# Patient Record
Sex: Female | Born: 1985 | Race: Black or African American | Hispanic: No | Marital: Single | Smoking: Current every day smoker
Health system: Southern US, Community
[De-identification: ages and names within clinical notes are randomized; demographics above are authoritative.]

## PROBLEM LIST (undated history)

## (undated) DIAGNOSIS — F209 Schizophrenia, unspecified: Secondary | ICD-10-CM

## (undated) DIAGNOSIS — F319 Bipolar disorder, unspecified: Secondary | ICD-10-CM

## (undated) DIAGNOSIS — B582 Toxoplasma meningoencephalitis: Secondary | ICD-10-CM

## (undated) DIAGNOSIS — F1910 Other psychoactive substance abuse, uncomplicated: Secondary | ICD-10-CM

## (undated) DIAGNOSIS — G2581 Restless legs syndrome: Secondary | ICD-10-CM

## (undated) DIAGNOSIS — B20 Human immunodeficiency virus [HIV] disease: Secondary | ICD-10-CM

## (undated) DIAGNOSIS — I10 Essential (primary) hypertension: Secondary | ICD-10-CM

## (undated) DIAGNOSIS — Z21 Asymptomatic human immunodeficiency virus [HIV] infection status: Secondary | ICD-10-CM

## (undated) DIAGNOSIS — E669 Obesity, unspecified: Secondary | ICD-10-CM

## (undated) DIAGNOSIS — B1910 Unspecified viral hepatitis B without hepatic coma: Secondary | ICD-10-CM

## (undated) HISTORY — DX: Unspecified viral hepatitis B without hepatic coma: B19.10

## (undated) HISTORY — DX: Other psychoactive substance abuse, uncomplicated: F19.10

## (undated) HISTORY — PX: TYMPANOSTOMY TUBE PLACEMENT: SHX32

## (undated) HISTORY — DX: Bipolar disorder, unspecified: F31.9

## (undated) HISTORY — PX: HERNIA REPAIR: SHX51

## (undated) HISTORY — PX: TUBAL LIGATION: SHX77

## (undated) HISTORY — DX: Schizophrenia, unspecified: F20.9

## (undated) HISTORY — DX: Human immunodeficiency virus (HIV) disease: B20

## (undated) HISTORY — DX: Asymptomatic human immunodeficiency virus (hiv) infection status: Z21

---
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
## 2004-12-19 ENCOUNTER — Emergency Department (HOSPITAL_COMMUNITY): Admission: EM | Admit: 2004-12-19 | Discharge: 2004-12-19 | Payer: Self-pay | Admitting: Emergency Medicine

## 2005-02-04 ENCOUNTER — Encounter: Payer: Self-pay | Admitting: *Deleted

## 2005-02-04 ENCOUNTER — Inpatient Hospital Stay (HOSPITAL_COMMUNITY): Admission: AD | Admit: 2005-02-04 | Discharge: 2005-02-04 | Payer: Self-pay | Admitting: *Deleted

## 2005-03-18 ENCOUNTER — Emergency Department (HOSPITAL_COMMUNITY): Admission: EM | Admit: 2005-03-18 | Discharge: 2005-03-18 | Payer: Self-pay | Admitting: Emergency Medicine

## 2005-08-23 ENCOUNTER — Emergency Department (HOSPITAL_COMMUNITY): Admission: EM | Admit: 2005-08-23 | Discharge: 2005-08-23 | Payer: Self-pay | Admitting: Emergency Medicine

## 2005-09-15 ENCOUNTER — Emergency Department (HOSPITAL_COMMUNITY): Admission: EM | Admit: 2005-09-15 | Discharge: 2005-09-15 | Payer: Self-pay | Admitting: Family Medicine

## 2005-10-25 ENCOUNTER — Emergency Department (HOSPITAL_COMMUNITY): Admission: EM | Admit: 2005-10-25 | Discharge: 2005-10-25 | Payer: Self-pay | Admitting: Emergency Medicine

## 2005-11-09 ENCOUNTER — Emergency Department (HOSPITAL_COMMUNITY): Admission: EM | Admit: 2005-11-09 | Discharge: 2005-11-09 | Payer: Self-pay | Admitting: Family Medicine

## 2005-11-21 ENCOUNTER — Emergency Department (HOSPITAL_COMMUNITY): Admission: EM | Admit: 2005-11-21 | Discharge: 2005-11-21 | Payer: Self-pay | Admitting: Emergency Medicine

## 2006-03-15 ENCOUNTER — Emergency Department (HOSPITAL_COMMUNITY): Admission: EM | Admit: 2006-03-15 | Discharge: 2006-03-15 | Payer: Self-pay | Admitting: Emergency Medicine

## 2006-04-18 ENCOUNTER — Emergency Department (HOSPITAL_COMMUNITY): Admission: EM | Admit: 2006-04-18 | Discharge: 2006-04-18 | Payer: Self-pay | Admitting: Family Medicine

## 2006-05-05 ENCOUNTER — Emergency Department (HOSPITAL_COMMUNITY): Admission: EM | Admit: 2006-05-05 | Discharge: 2006-05-05 | Payer: Self-pay | Admitting: Family Medicine

## 2006-08-26 ENCOUNTER — Emergency Department (HOSPITAL_COMMUNITY): Admission: EM | Admit: 2006-08-26 | Discharge: 2006-08-26 | Payer: Self-pay | Admitting: Family Medicine

## 2006-10-13 ENCOUNTER — Ambulatory Visit: Payer: Self-pay | Admitting: Psychiatry

## 2006-10-13 ENCOUNTER — Inpatient Hospital Stay (HOSPITAL_COMMUNITY): Admission: AD | Admit: 2006-10-13 | Discharge: 2006-10-15 | Payer: Self-pay | Admitting: Psychiatry

## 2007-02-06 ENCOUNTER — Emergency Department (HOSPITAL_COMMUNITY): Admission: EM | Admit: 2007-02-06 | Discharge: 2007-02-06 | Payer: Self-pay | Admitting: Family Medicine

## 2007-03-17 ENCOUNTER — Emergency Department (HOSPITAL_COMMUNITY): Admission: EM | Admit: 2007-03-17 | Discharge: 2007-03-17 | Payer: Self-pay | Admitting: Emergency Medicine

## 2007-06-03 ENCOUNTER — Inpatient Hospital Stay (HOSPITAL_COMMUNITY): Admission: AD | Admit: 2007-06-03 | Discharge: 2007-06-03 | Payer: Self-pay | Admitting: Obstetrics & Gynecology

## 2007-07-16 ENCOUNTER — Emergency Department (HOSPITAL_COMMUNITY): Admission: EM | Admit: 2007-07-16 | Discharge: 2007-07-16 | Payer: Self-pay | Admitting: Emergency Medicine

## 2007-09-17 ENCOUNTER — Emergency Department (HOSPITAL_COMMUNITY): Admission: EM | Admit: 2007-09-17 | Discharge: 2007-09-17 | Payer: Self-pay | Admitting: Emergency Medicine

## 2007-09-27 ENCOUNTER — Inpatient Hospital Stay (HOSPITAL_COMMUNITY): Admission: AD | Admit: 2007-09-27 | Discharge: 2007-09-28 | Payer: Self-pay | Admitting: Obstetrics and Gynecology

## 2007-11-05 ENCOUNTER — Emergency Department (HOSPITAL_COMMUNITY): Admission: EM | Admit: 2007-11-05 | Discharge: 2007-11-05 | Payer: Self-pay | Admitting: Emergency Medicine

## 2007-11-26 ENCOUNTER — Emergency Department (HOSPITAL_COMMUNITY): Admission: EM | Admit: 2007-11-26 | Discharge: 2007-11-26 | Payer: Self-pay | Admitting: Emergency Medicine

## 2008-03-13 ENCOUNTER — Emergency Department (HOSPITAL_COMMUNITY): Admission: EM | Admit: 2008-03-13 | Discharge: 2008-03-13 | Payer: Self-pay | Admitting: Emergency Medicine

## 2008-03-15 ENCOUNTER — Inpatient Hospital Stay (HOSPITAL_COMMUNITY): Admission: AD | Admit: 2008-03-15 | Discharge: 2008-03-15 | Payer: Self-pay | Admitting: Obstetrics & Gynecology

## 2008-04-06 ENCOUNTER — Ambulatory Visit: Payer: Self-pay | Admitting: Obstetrics & Gynecology

## 2008-04-06 ENCOUNTER — Encounter: Payer: Self-pay | Admitting: Internal Medicine

## 2008-04-06 ENCOUNTER — Encounter: Payer: Self-pay | Admitting: Obstetrics and Gynecology

## 2008-04-06 LAB — CONVERTED CEMR LAB
Pap Smear: NORMAL
RPR Ser Ql: NEGATIVE

## 2008-04-08 ENCOUNTER — Ambulatory Visit: Payer: Self-pay | Admitting: Obstetrics and Gynecology

## 2008-04-14 ENCOUNTER — Encounter: Payer: Self-pay | Admitting: Internal Medicine

## 2008-04-21 ENCOUNTER — Ambulatory Visit: Payer: Self-pay | Admitting: Family Medicine

## 2008-04-27 ENCOUNTER — Ambulatory Visit: Payer: Self-pay | Admitting: Internal Medicine

## 2008-04-27 DIAGNOSIS — B20 Human immunodeficiency virus [HIV] disease: Secondary | ICD-10-CM

## 2008-04-28 ENCOUNTER — Ambulatory Visit (HOSPITAL_COMMUNITY): Admission: RE | Admit: 2008-04-28 | Discharge: 2008-04-28 | Payer: Self-pay | Admitting: Family Medicine

## 2008-05-02 ENCOUNTER — Encounter: Payer: Self-pay | Admitting: Internal Medicine

## 2008-05-04 ENCOUNTER — Telehealth: Payer: Self-pay | Admitting: Internal Medicine

## 2008-05-10 ENCOUNTER — Encounter (INDEPENDENT_AMBULATORY_CARE_PROVIDER_SITE_OTHER): Payer: Self-pay | Admitting: *Deleted

## 2008-05-11 ENCOUNTER — Telehealth: Payer: Self-pay | Admitting: Internal Medicine

## 2008-05-12 ENCOUNTER — Ambulatory Visit: Payer: Self-pay | Admitting: Obstetrics & Gynecology

## 2008-05-30 ENCOUNTER — Telehealth (INDEPENDENT_AMBULATORY_CARE_PROVIDER_SITE_OTHER): Payer: Self-pay | Admitting: *Deleted

## 2008-05-30 ENCOUNTER — Inpatient Hospital Stay (HOSPITAL_COMMUNITY): Admission: AD | Admit: 2008-05-30 | Discharge: 2008-05-30 | Payer: Self-pay | Admitting: Obstetrics & Gynecology

## 2008-06-02 ENCOUNTER — Ambulatory Visit (HOSPITAL_COMMUNITY): Admission: RE | Admit: 2008-06-02 | Discharge: 2008-06-02 | Payer: Self-pay | Admitting: Family Medicine

## 2008-06-10 ENCOUNTER — Telehealth (INDEPENDENT_AMBULATORY_CARE_PROVIDER_SITE_OTHER): Payer: Self-pay | Admitting: *Deleted

## 2008-06-28 ENCOUNTER — Encounter: Admission: RE | Admit: 2008-06-28 | Discharge: 2008-06-28 | Payer: Self-pay | Admitting: Internal Medicine

## 2008-06-28 ENCOUNTER — Ambulatory Visit: Payer: Self-pay | Admitting: Internal Medicine

## 2008-06-28 LAB — CONVERTED CEMR LAB
ALT: 12 units/L (ref 0–35)
AST: 14 units/L (ref 0–37)
Alkaline Phosphatase: 64 units/L (ref 39–117)
Basophils Absolute: 0 10*3/uL (ref 0.0–0.1)
Basophils Relative: 0 % (ref 0–1)
Creatinine, Ser: 0.57 mg/dL (ref 0.40–1.20)
Eosinophils Absolute: 0.1 10*3/uL (ref 0.0–0.7)
Eosinophils Relative: 1 % (ref 0–5)
HCT: 33.8 % — ABNORMAL LOW (ref 36.0–46.0)
Lymphocytes Relative: 40 % (ref 12–46)
MCHC: 34.9 g/dL (ref 30.0–36.0)
MCV: 84.9 fL (ref 78.0–100.0)
Platelets: 258 10*3/uL (ref 150–400)
RDW: 18.2 % — ABNORMAL HIGH (ref 11.5–15.5)
Sodium: 136 meq/L (ref 135–145)
Total Bilirubin: 0.5 mg/dL (ref 0.3–1.2)
Total Protein: 6.5 g/dL (ref 6.0–8.3)

## 2008-06-30 ENCOUNTER — Ambulatory Visit (HOSPITAL_COMMUNITY): Admission: RE | Admit: 2008-06-30 | Discharge: 2008-06-30 | Payer: Self-pay | Admitting: Family Medicine

## 2008-07-15 ENCOUNTER — Encounter: Payer: Self-pay | Admitting: Internal Medicine

## 2008-07-28 ENCOUNTER — Encounter: Payer: Self-pay | Admitting: Internal Medicine

## 2008-08-08 ENCOUNTER — Ambulatory Visit: Payer: Self-pay | Admitting: Obstetrics & Gynecology

## 2008-08-11 ENCOUNTER — Ambulatory Visit (HOSPITAL_COMMUNITY): Admission: RE | Admit: 2008-08-11 | Discharge: 2008-08-11 | Payer: Self-pay | Admitting: Family Medicine

## 2008-08-22 ENCOUNTER — Ambulatory Visit: Payer: Self-pay | Admitting: Family Medicine

## 2008-08-24 ENCOUNTER — Ambulatory Visit: Payer: Self-pay | Admitting: Internal Medicine

## 2008-08-24 LAB — CONVERTED CEMR LAB
ALT: 15 units/L (ref 0–35)
BUN: 8 mg/dL (ref 6–23)
Basophils Absolute: 0 10*3/uL (ref 0.0–0.1)
Basophils Relative: 0 % (ref 0–1)
CO2: 21 meq/L (ref 19–32)
Calcium: 8.3 mg/dL — ABNORMAL LOW (ref 8.4–10.5)
Chloride: 104 meq/L (ref 96–112)
Creatinine, Ser: 0.59 mg/dL (ref 0.40–1.20)
Eosinophils Relative: 1 % (ref 0–5)
HCT: 36.2 % (ref 36.0–46.0)
Hemoglobin: 12.5 g/dL (ref 12.0–15.0)
MCHC: 34.5 g/dL (ref 30.0–36.0)
Monocytes Absolute: 0.4 10*3/uL (ref 0.1–1.0)
Monocytes Relative: 6 % (ref 3–12)
RBC: 3.93 M/uL (ref 3.87–5.11)
RDW: 17.5 % — ABNORMAL HIGH (ref 11.5–15.5)
Total Bilirubin: 0.9 mg/dL (ref 0.3–1.2)

## 2008-08-26 LAB — CONVERTED CEMR LAB: HIV 1 RNA Quant: <50 copies/mL (ref <50)

## 2008-09-01 ENCOUNTER — Ambulatory Visit (HOSPITAL_COMMUNITY): Admission: RE | Admit: 2008-09-01 | Discharge: 2008-09-01 | Payer: Self-pay | Admitting: Family Medicine

## 2008-09-05 ENCOUNTER — Ambulatory Visit: Payer: Self-pay | Admitting: Obstetrics & Gynecology

## 2008-09-07 ENCOUNTER — Ambulatory Visit: Payer: Self-pay | Admitting: Internal Medicine

## 2008-09-12 ENCOUNTER — Ambulatory Visit: Payer: Self-pay | Admitting: Obstetrics & Gynecology

## 2008-09-19 ENCOUNTER — Ambulatory Visit: Payer: Self-pay | Admitting: Obstetrics & Gynecology

## 2008-09-20 ENCOUNTER — Encounter: Payer: Self-pay | Admitting: Obstetrics & Gynecology

## 2008-09-22 ENCOUNTER — Ambulatory Visit: Payer: Self-pay | Admitting: Family Medicine

## 2008-09-22 ENCOUNTER — Encounter: Payer: Self-pay | Admitting: Obstetrics & Gynecology

## 2008-09-22 LAB — CONVERTED CEMR LAB
Albumin: 3.4 g/dL — ABNORMAL LOW (ref 3.5–5.2)
BUN: 9 mg/dL (ref 6–23)
CO2: 19 meq/L (ref 19–32)
Calcium: 8.3 mg/dL — ABNORMAL LOW (ref 8.4–10.5)
Chloride: 106 meq/L (ref 96–112)
Collection Interval-CRCL: 24 hr
Creatinine, Urine: 85 mg/dL
Glucose, Bld: 70 mg/dL (ref 70–99)
Hemoglobin: 12.3 g/dL (ref 12.0–15.0)
MCHC: 34.9 g/dL (ref 30.0–36.0)
Potassium: 3.3 meq/L — ABNORMAL LOW (ref 3.5–5.3)
Protein, Ur: 165 mg/24hr — ABNORMAL HIGH (ref 50–100)
RBC: 3.81 M/uL — ABNORMAL LOW (ref 3.87–5.11)
Sodium: 137 meq/L (ref 135–145)
Total Protein: 6.6 g/dL (ref 6.0–8.3)
Uric Acid, Serum: 5.1 mg/dL (ref 2.4–7.0)
WBC: 4.4 10*3/uL (ref 4.0–10.5)

## 2008-09-29 ENCOUNTER — Ambulatory Visit (HOSPITAL_COMMUNITY): Admission: RE | Admit: 2008-09-29 | Discharge: 2008-09-29 | Payer: Self-pay | Admitting: Family Medicine

## 2008-09-29 ENCOUNTER — Ambulatory Visit: Payer: Self-pay | Admitting: Obstetrics & Gynecology

## 2008-10-03 ENCOUNTER — Ambulatory Visit: Payer: Self-pay | Admitting: Obstetrics & Gynecology

## 2008-10-06 ENCOUNTER — Ambulatory Visit: Payer: Self-pay | Admitting: Obstetrics & Gynecology

## 2008-10-06 LAB — CONVERTED CEMR LAB
HIV 1 RNA Quant: <48 copies/mL (ref <48)
HIV-1 RNA Quant, Log: <1.68 (ref <1.68)

## 2008-10-10 ENCOUNTER — Ambulatory Visit: Payer: Self-pay | Admitting: Family Medicine

## 2008-10-13 ENCOUNTER — Ambulatory Visit: Payer: Self-pay | Admitting: Obstetrics & Gynecology

## 2008-10-14 ENCOUNTER — Encounter: Payer: Self-pay | Admitting: Obstetrics & Gynecology

## 2008-10-15 ENCOUNTER — Inpatient Hospital Stay (HOSPITAL_COMMUNITY): Admission: AD | Admit: 2008-10-15 | Discharge: 2008-10-15 | Payer: Self-pay | Admitting: Family Medicine

## 2008-10-21 ENCOUNTER — Ambulatory Visit: Payer: Self-pay | Admitting: Obstetrics & Gynecology

## 2008-10-21 ENCOUNTER — Inpatient Hospital Stay (HOSPITAL_COMMUNITY): Admission: AD | Admit: 2008-10-21 | Discharge: 2008-10-25 | Payer: Self-pay | Admitting: Obstetrics & Gynecology

## 2008-10-23 ENCOUNTER — Encounter: Payer: Self-pay | Admitting: Obstetrics & Gynecology

## 2008-10-31 IMAGING — US US OB COMP LESS 14 WK
1 series · 14 of 28 positions shown · non-contrast
Comparison: none

CLINICAL DATA: Positive pregnancy test, abdominal and pelvic pain

OBSTETRIC <14 WK ULTRASOUND
TECHNIQUE: Transabdominal ultrasound was performed for evaluation
of the gestation as well as the maternal uterus and adnexal
regions.
Intrauterine gestational sac: Present
Yolk sac: Present
Embryo: Present
Cardiac Activity: Present
Heart Rate: 140 bpm
CRL:  6 mm         6w  2d           US EDC: 11/06/2008
Maternal uterus/adnexae:
Right ovary:  3.6 x 3.0 x 3.4 cm cyst noted, possibly a corpus
luteum.
The left ovary is normal.

[Series 1: us ob comp less 14 wk · 0.28mm/px · 14 of 30 slices shown]
[im 2/30]
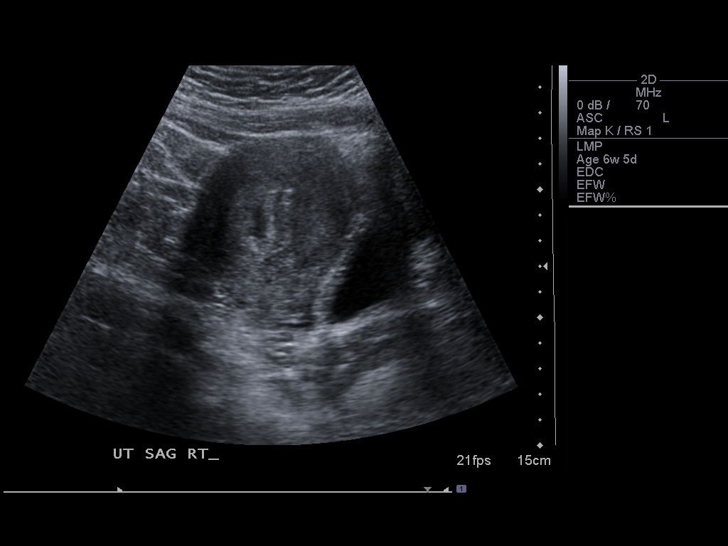
[im 4/30]
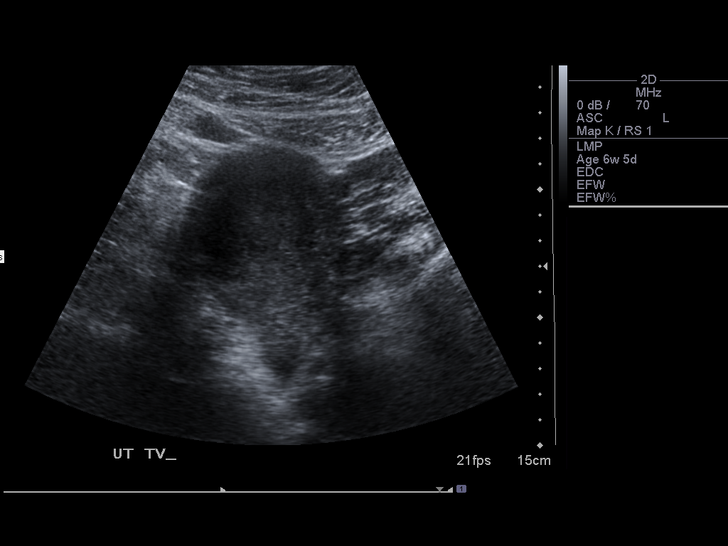
[im 6/30]
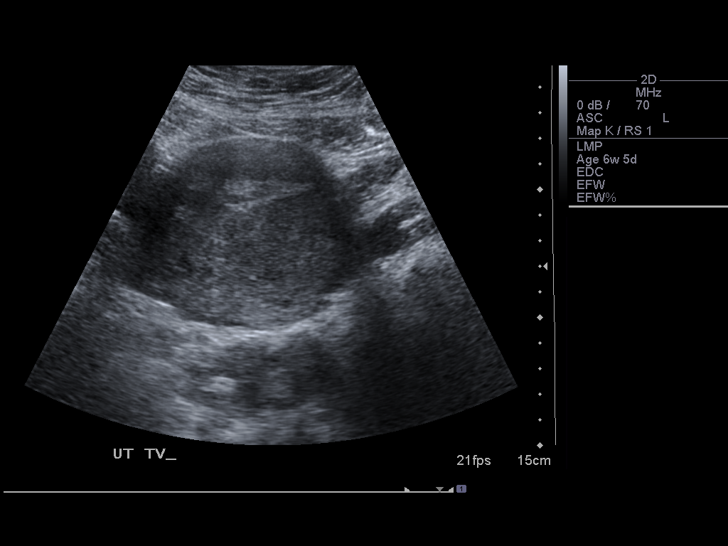
[im 8/30]
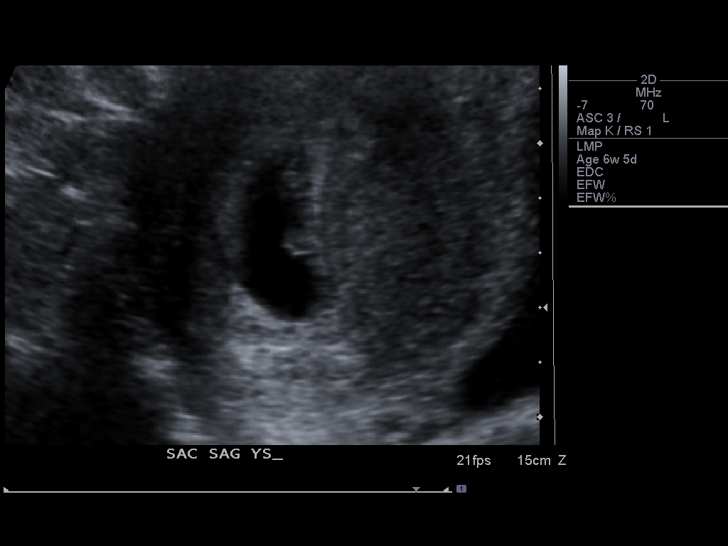
[im 10/30]
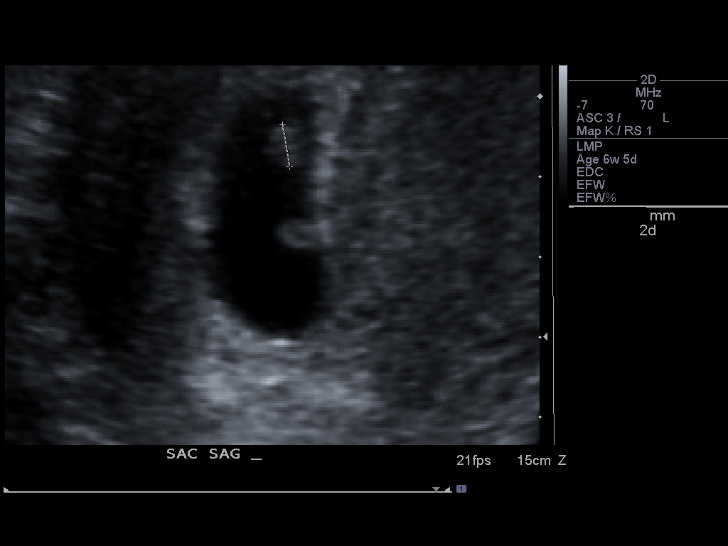
[im 12/30]
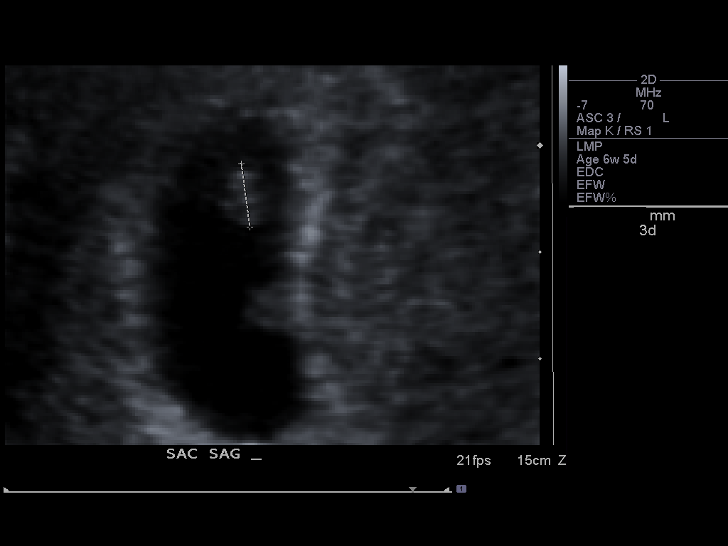
[im 14/30]
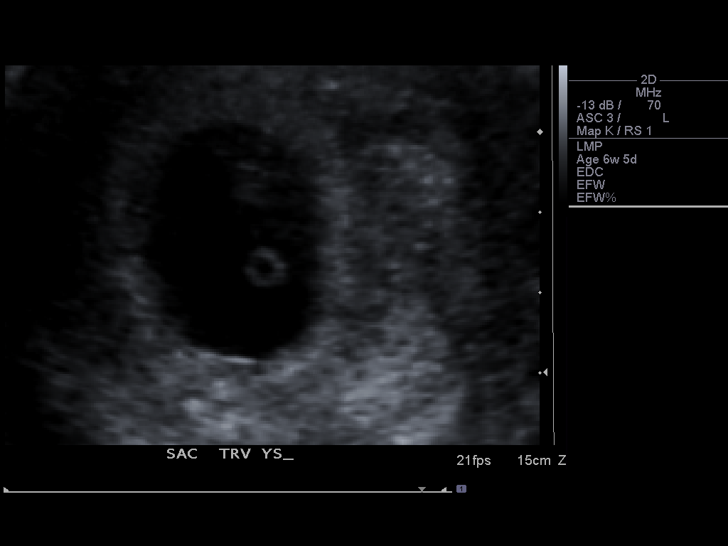
[im 17/30]
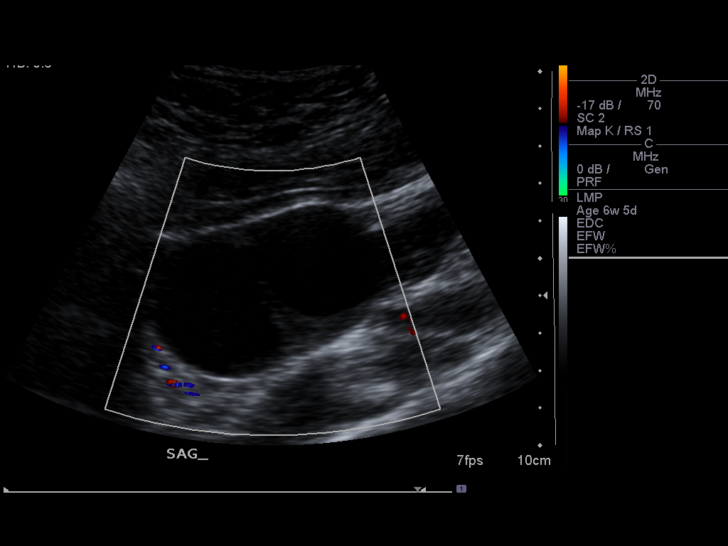
[im 19/30]
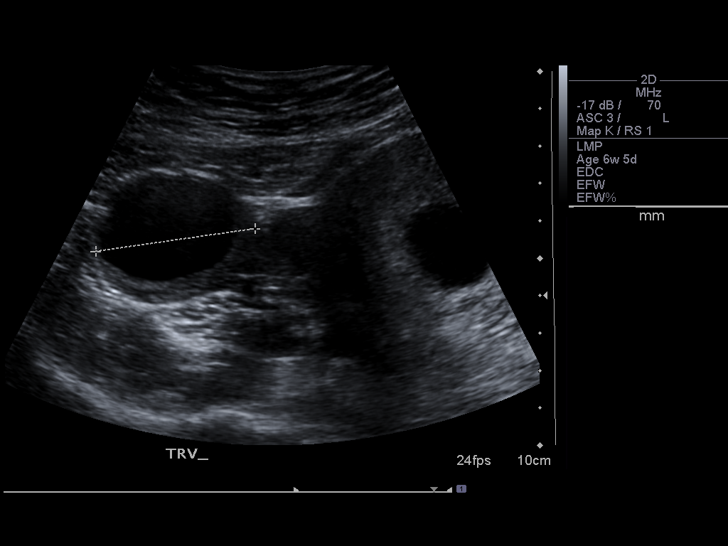
[im 21/30]
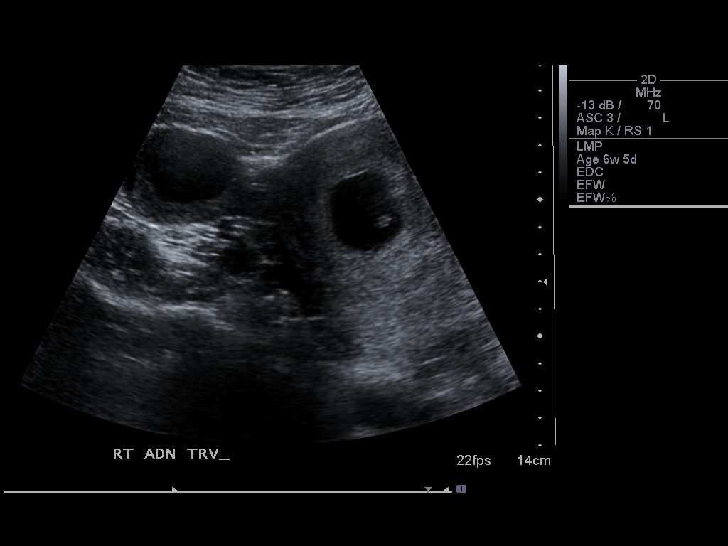
[im 23/30]
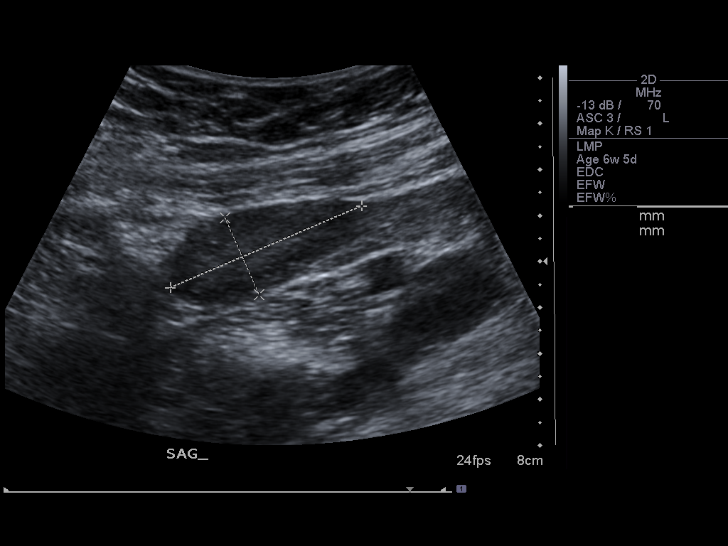
[im 25/30]
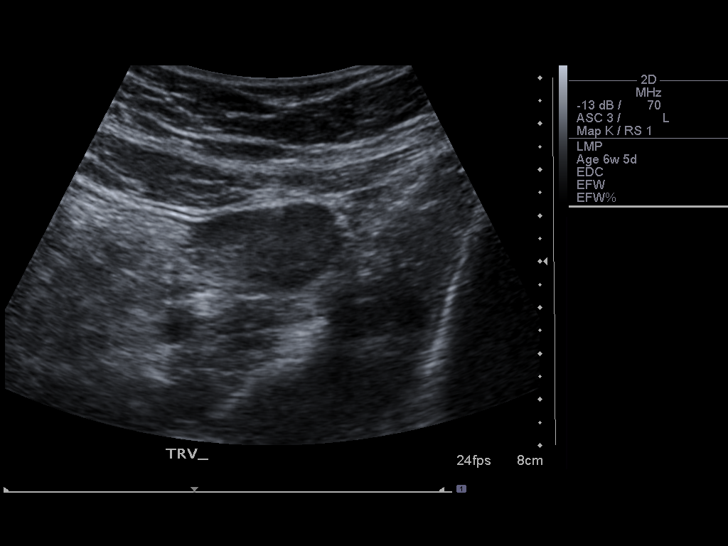
[im 27/30]
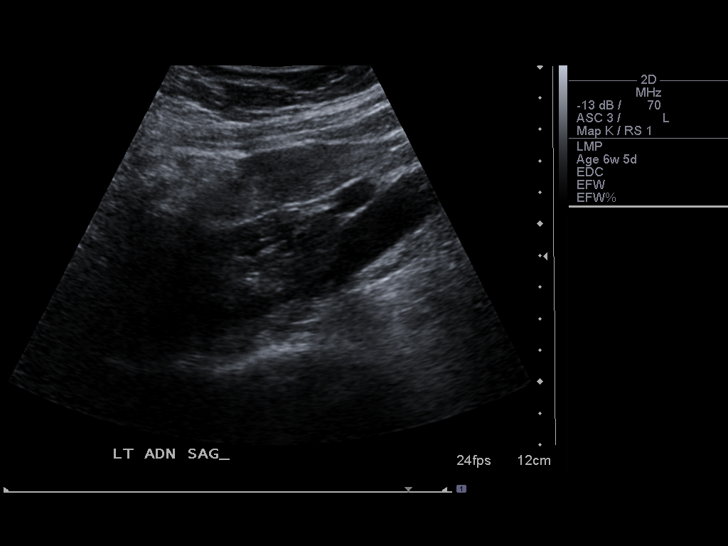
[im 30/30]
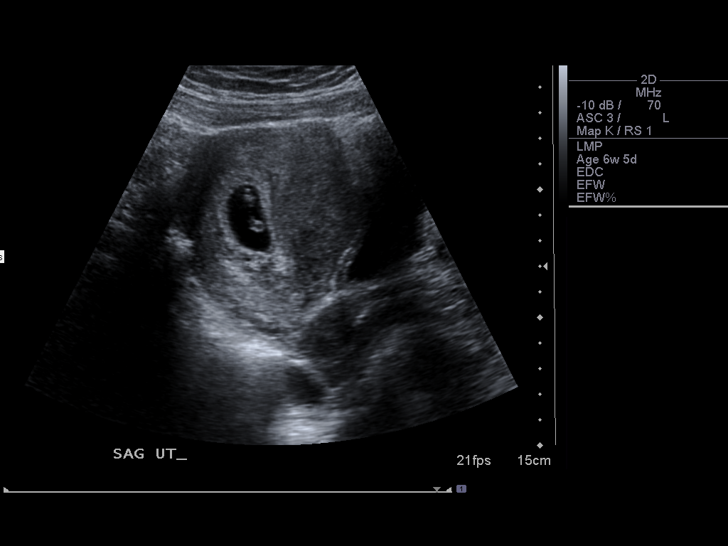

[14 of 28 positions shown; findings below may reference images not displayed]

IMPRESSION: Intrauterine gestational sac, yolk sac, fetal pole, and cardiac
activity noted.  Gestational age of 6w2d by crown-rump length is
concordant with assigned gestational age of 6 weeks 5 days by LMP,
EDC by LMP 11/03/2008.  No acute finding.

Right ovarian cyst, possibly a corpus luteum.

## 2008-12-05 ENCOUNTER — Ambulatory Visit: Payer: Self-pay | Admitting: Internal Medicine

## 2008-12-05 LAB — CONVERTED CEMR LAB
ALT: 13 units/L (ref 0–35)
Basophils Absolute: 0 10*3/uL (ref 0.0–0.1)
CO2: 22 meq/L (ref 19–32)
Calcium: 9.1 mg/dL (ref 8.4–10.5)
Chloride: 105 meq/L (ref 96–112)
Creatinine, Ser: 0.72 mg/dL (ref 0.40–1.20)
Glucose, Bld: 77 mg/dL (ref 70–99)
HCT: 40.4 % (ref 36.0–46.0)
HIV 1 RNA Quant: <48 copies/mL (ref <48)
HIV-1 RNA Quant, Log: <1.68 (ref <1.68)
Hemoglobin: 14 g/dL (ref 12.0–15.0)
Lymphocytes Relative: 62 % — ABNORMAL HIGH (ref 12–46)
Lymphs Abs: 3 10*3/uL (ref 0.7–4.0)
Monocytes Absolute: 0.3 10*3/uL (ref 0.1–1.0)
Neutro Abs: 1.5 10*3/uL — ABNORMAL LOW (ref 1.7–7.7)
RBC: 4.26 M/uL (ref 3.87–5.11)
RDW: 13.6 % (ref 11.5–15.5)
Total Protein: 7.4 g/dL (ref 6.0–8.3)
WBC: 4.9 10*3/uL (ref 4.0–10.5)

## 2008-12-21 ENCOUNTER — Ambulatory Visit: Payer: Self-pay | Admitting: Obstetrics and Gynecology

## 2008-12-21 ENCOUNTER — Ambulatory Visit: Payer: Self-pay | Admitting: Internal Medicine

## 2008-12-22 ENCOUNTER — Encounter: Payer: Self-pay | Admitting: Obstetrics & Gynecology

## 2008-12-22 LAB — CONVERTED CEMR LAB: Trich, Wet Prep: NONE SEEN

## 2009-01-05 ENCOUNTER — Encounter (INDEPENDENT_AMBULATORY_CARE_PROVIDER_SITE_OTHER): Payer: Self-pay | Admitting: *Deleted

## 2009-01-09 ENCOUNTER — Encounter: Payer: Self-pay | Admitting: Internal Medicine

## 2009-03-20 ENCOUNTER — Emergency Department (HOSPITAL_COMMUNITY): Admission: EM | Admit: 2009-03-20 | Discharge: 2009-03-20 | Payer: Self-pay | Admitting: Family Medicine

## 2009-03-21 ENCOUNTER — Ambulatory Visit: Payer: Self-pay | Admitting: Internal Medicine

## 2009-03-21 LAB — CONVERTED CEMR LAB
Albumin: 3.9 g/dL (ref 3.5–5.2)
BUN: 17 mg/dL (ref 6–23)
Calcium: 8.8 mg/dL (ref 8.4–10.5)
Chloride: 106 meq/L (ref 96–112)
Creatinine, Ser: 0.73 mg/dL (ref 0.40–1.20)
Eosinophils Absolute: 0.1 10*3/uL (ref 0.0–0.7)
GFR calc Af Amer: >60 mL/min (ref >60)
GFR calc non Af Amer: >60 mL/min (ref >60)
Glucose, Bld: 85 mg/dL (ref 70–99)
HIV 1 RNA Quant: 3530 copies/mL — ABNORMAL HIGH (ref <48)
HIV-1 RNA Quant, Log: 3.55 — ABNORMAL HIGH (ref <1.68)
Lymphocytes Relative: 56 % — ABNORMAL HIGH (ref 12–46)
Lymphs Abs: 2.4 10*3/uL (ref 0.7–4.0)
Neutro Abs: 1.4 10*3/uL — ABNORMAL LOW (ref 1.7–7.7)
Neutrophils Relative %: 34 % — ABNORMAL LOW (ref 43–77)
Platelets: 267 10*3/uL (ref 150–400)
Potassium: 4.5 meq/L (ref 3.5–5.3)
WBC: 4.2 10*3/uL (ref 4.0–10.5)

## 2009-04-03 ENCOUNTER — Inpatient Hospital Stay (HOSPITAL_COMMUNITY): Admission: AD | Admit: 2009-04-03 | Discharge: 2009-04-03 | Payer: Self-pay | Admitting: Family Medicine

## 2009-04-19 IMAGING — US US OB FOLLOW-UP
1 series · 14 of 28 positions shown · non-contrast
Comparison: none

OBSTETRICAL ULTRASOUND:
 This ultrasound was performed in The [HOSPITAL], and the [facility] report will be stored to [REDACTED] PACS.

[Series 1: us ob follow-up · 14 of 33 slices shown]
[im 2/33]
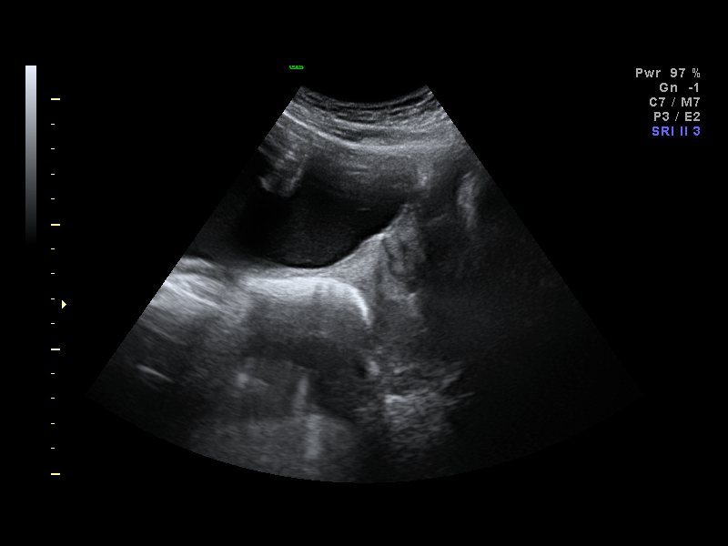
[im 4/33]
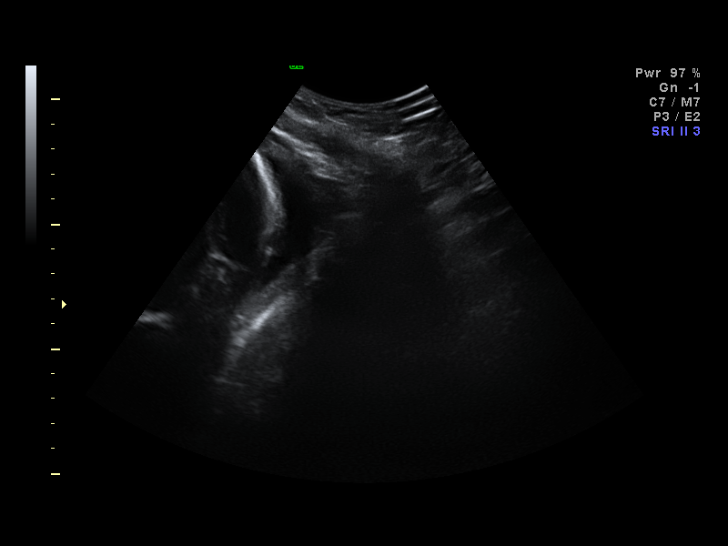
[im 6/33]
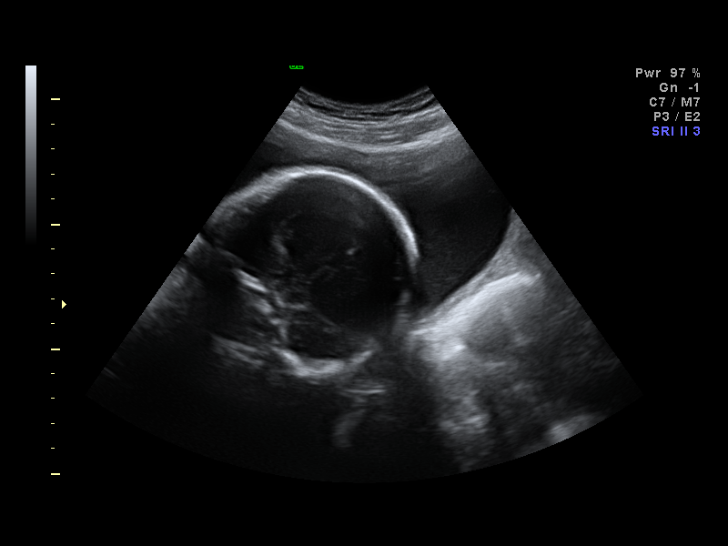
[im 9/33]
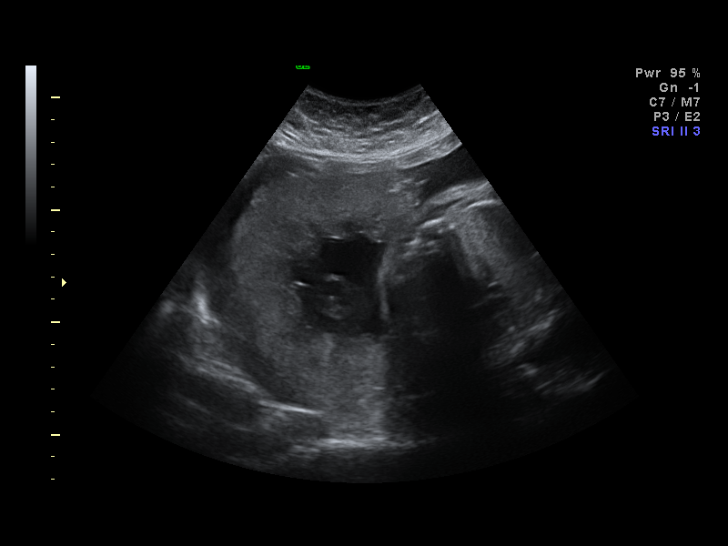
[im 11/33]
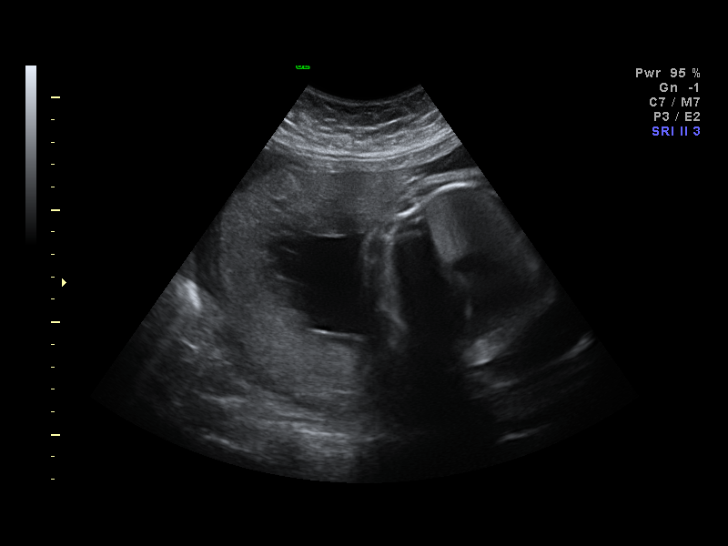
[im 14/33]
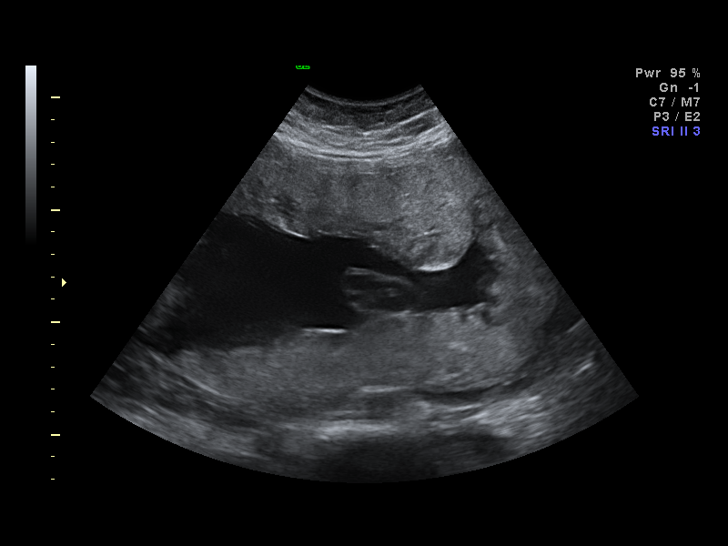
[im 16/33]
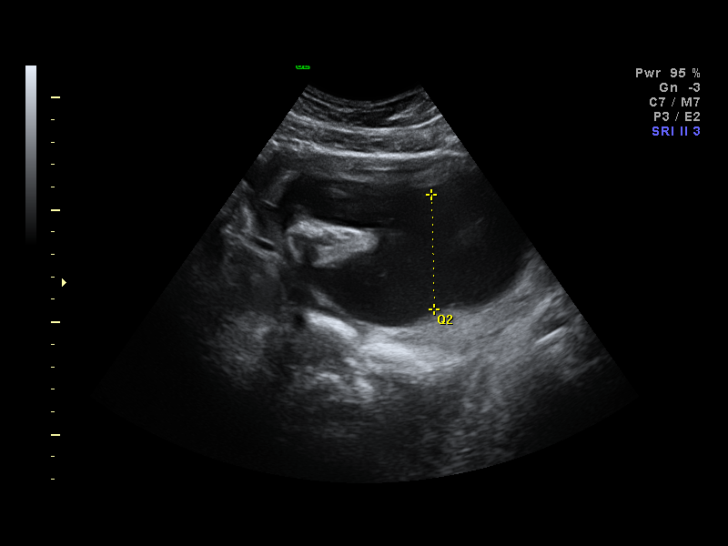
[im 18/33]
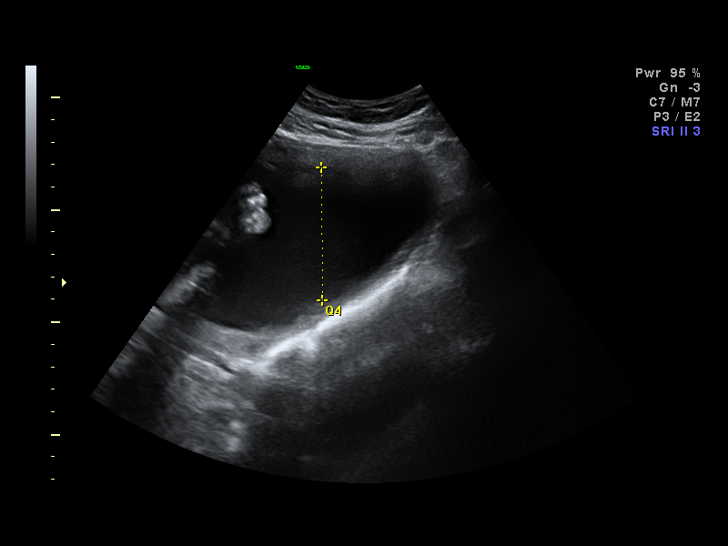
[im 21/33]
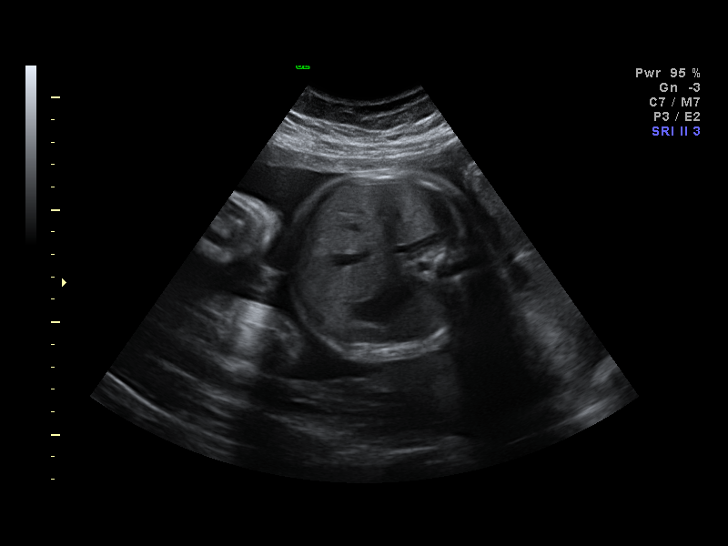
[im 23/33]
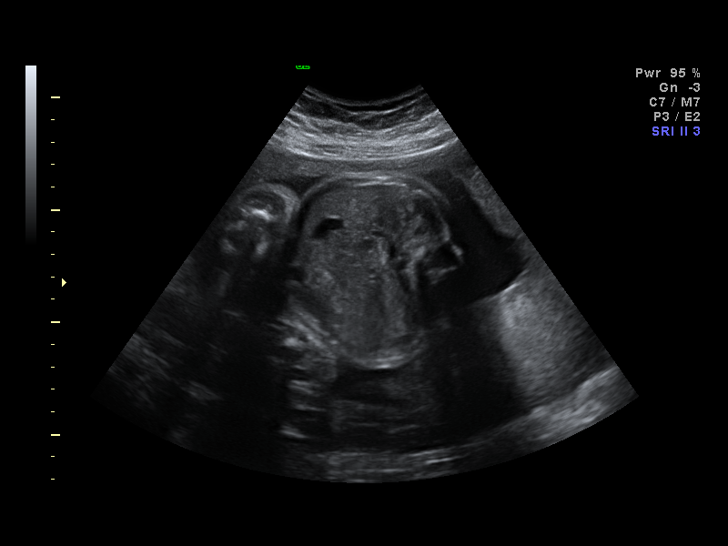
[im 25/33]
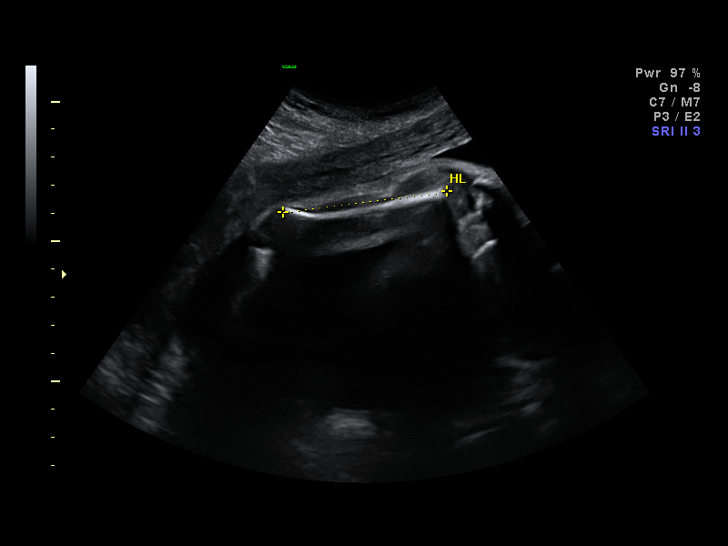
[im 28/33]
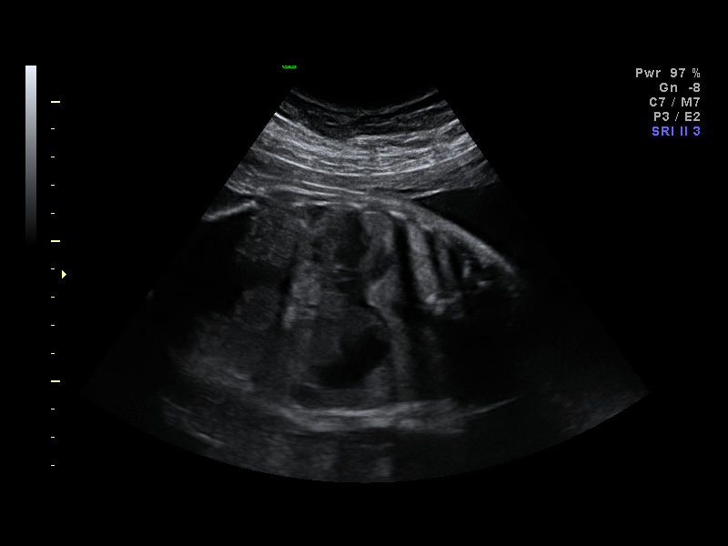
[im 30/33]
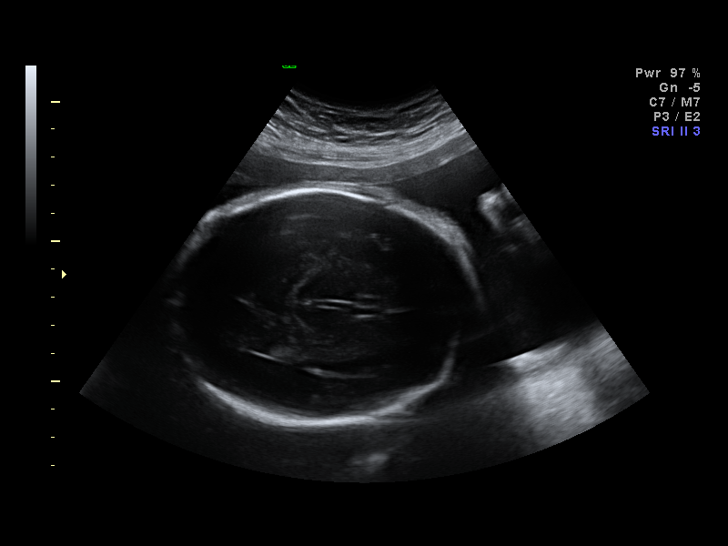
[im 33/33]
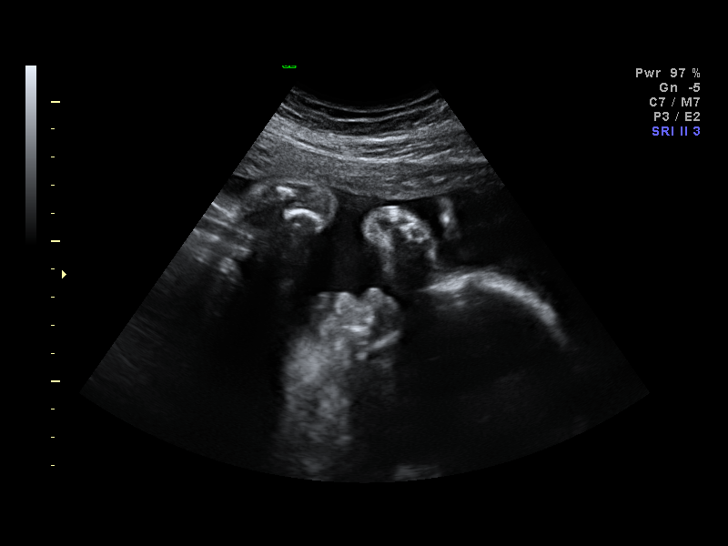

[14 of 28 positions shown; findings below may reference images not displayed]

IMPRESSION: [facility] has also been faxed to the ordering physician.

## 2009-06-23 ENCOUNTER — Encounter: Payer: Self-pay | Admitting: Internal Medicine

## 2009-06-28 ENCOUNTER — Telehealth (INDEPENDENT_AMBULATORY_CARE_PROVIDER_SITE_OTHER): Payer: Self-pay | Admitting: *Deleted

## 2009-06-29 ENCOUNTER — Ambulatory Visit: Payer: Self-pay | Admitting: Internal Medicine

## 2009-06-29 LAB — CONVERTED CEMR LAB: HIV 1 RNA Quant: 4530 copies/mL — ABNORMAL HIGH (ref <48)

## 2009-07-14 ENCOUNTER — Ambulatory Visit: Payer: Self-pay | Admitting: Internal Medicine

## 2009-08-04 ENCOUNTER — Emergency Department (HOSPITAL_COMMUNITY): Admission: EM | Admit: 2009-08-04 | Discharge: 2009-08-04 | Payer: Self-pay | Admitting: Emergency Medicine

## 2009-08-08 ENCOUNTER — Emergency Department (HOSPITAL_COMMUNITY): Admission: EM | Admit: 2009-08-08 | Discharge: 2009-08-08 | Payer: Self-pay | Admitting: Family Medicine

## 2009-08-28 ENCOUNTER — Encounter: Admission: RE | Admit: 2009-08-28 | Discharge: 2009-08-28 | Payer: Self-pay | Admitting: General Surgery

## 2009-08-29 ENCOUNTER — Ambulatory Visit (HOSPITAL_BASED_OUTPATIENT_CLINIC_OR_DEPARTMENT_OTHER): Admission: RE | Admit: 2009-08-29 | Discharge: 2009-08-29 | Payer: Self-pay | Admitting: General Surgery

## 2009-09-05 ENCOUNTER — Emergency Department (HOSPITAL_COMMUNITY): Admission: EM | Admit: 2009-09-05 | Discharge: 2009-09-06 | Payer: Self-pay | Admitting: Emergency Medicine

## 2009-09-11 ENCOUNTER — Emergency Department (HOSPITAL_COMMUNITY): Admission: EM | Admit: 2009-09-11 | Discharge: 2009-09-11 | Payer: Self-pay | Admitting: Emergency Medicine

## 2009-10-01 ENCOUNTER — Emergency Department (HOSPITAL_COMMUNITY): Admission: EM | Admit: 2009-10-01 | Discharge: 2009-10-01 | Payer: Self-pay | Admitting: Emergency Medicine

## 2009-10-02 ENCOUNTER — Encounter: Payer: Self-pay | Admitting: *Deleted

## 2009-11-07 ENCOUNTER — Encounter (INDEPENDENT_AMBULATORY_CARE_PROVIDER_SITE_OTHER): Payer: Self-pay | Admitting: *Deleted

## 2009-11-29 ENCOUNTER — Telehealth (INDEPENDENT_AMBULATORY_CARE_PROVIDER_SITE_OTHER): Payer: Self-pay | Admitting: *Deleted

## 2009-12-08 ENCOUNTER — Encounter (INDEPENDENT_AMBULATORY_CARE_PROVIDER_SITE_OTHER): Payer: Self-pay | Admitting: *Deleted

## 2010-01-05 ENCOUNTER — Encounter: Payer: Self-pay | Admitting: Internal Medicine

## 2010-02-18 ENCOUNTER — Emergency Department (HOSPITAL_BASED_OUTPATIENT_CLINIC_OR_DEPARTMENT_OTHER): Admission: EM | Admit: 2010-02-18 | Discharge: 2010-02-18 | Payer: Self-pay | Admitting: Emergency Medicine

## 2010-04-29 IMAGING — CR DG ABDOMEN 2V
2 series · 2 of 2 positions shown · non-contrast
Comparison: None

CLINICAL DATA: Abdominal pain/abdominal hernia surgery 1 month ago

ABDOMEN - 2 VIEW

[w abdomen upright *]
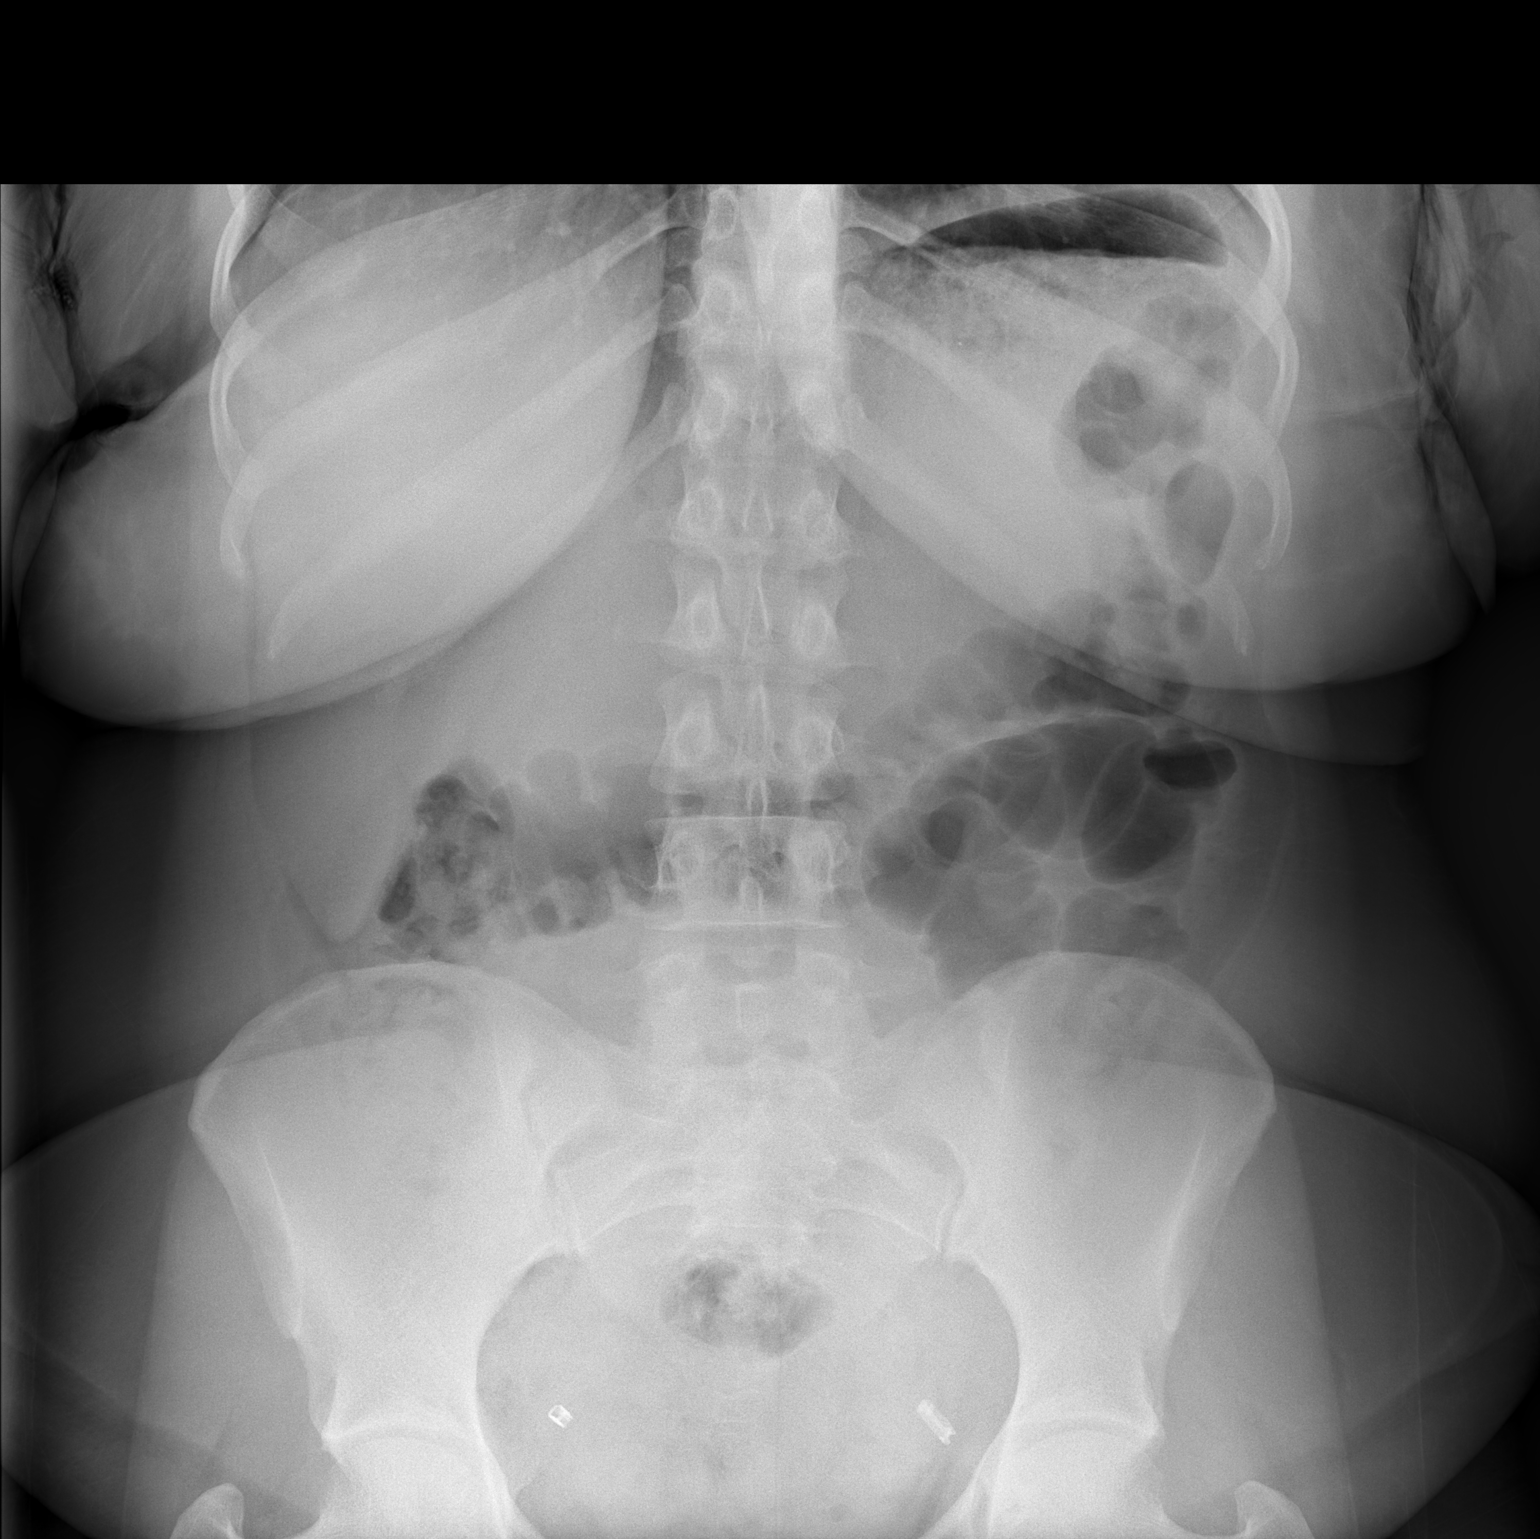

[t abdomen supine]
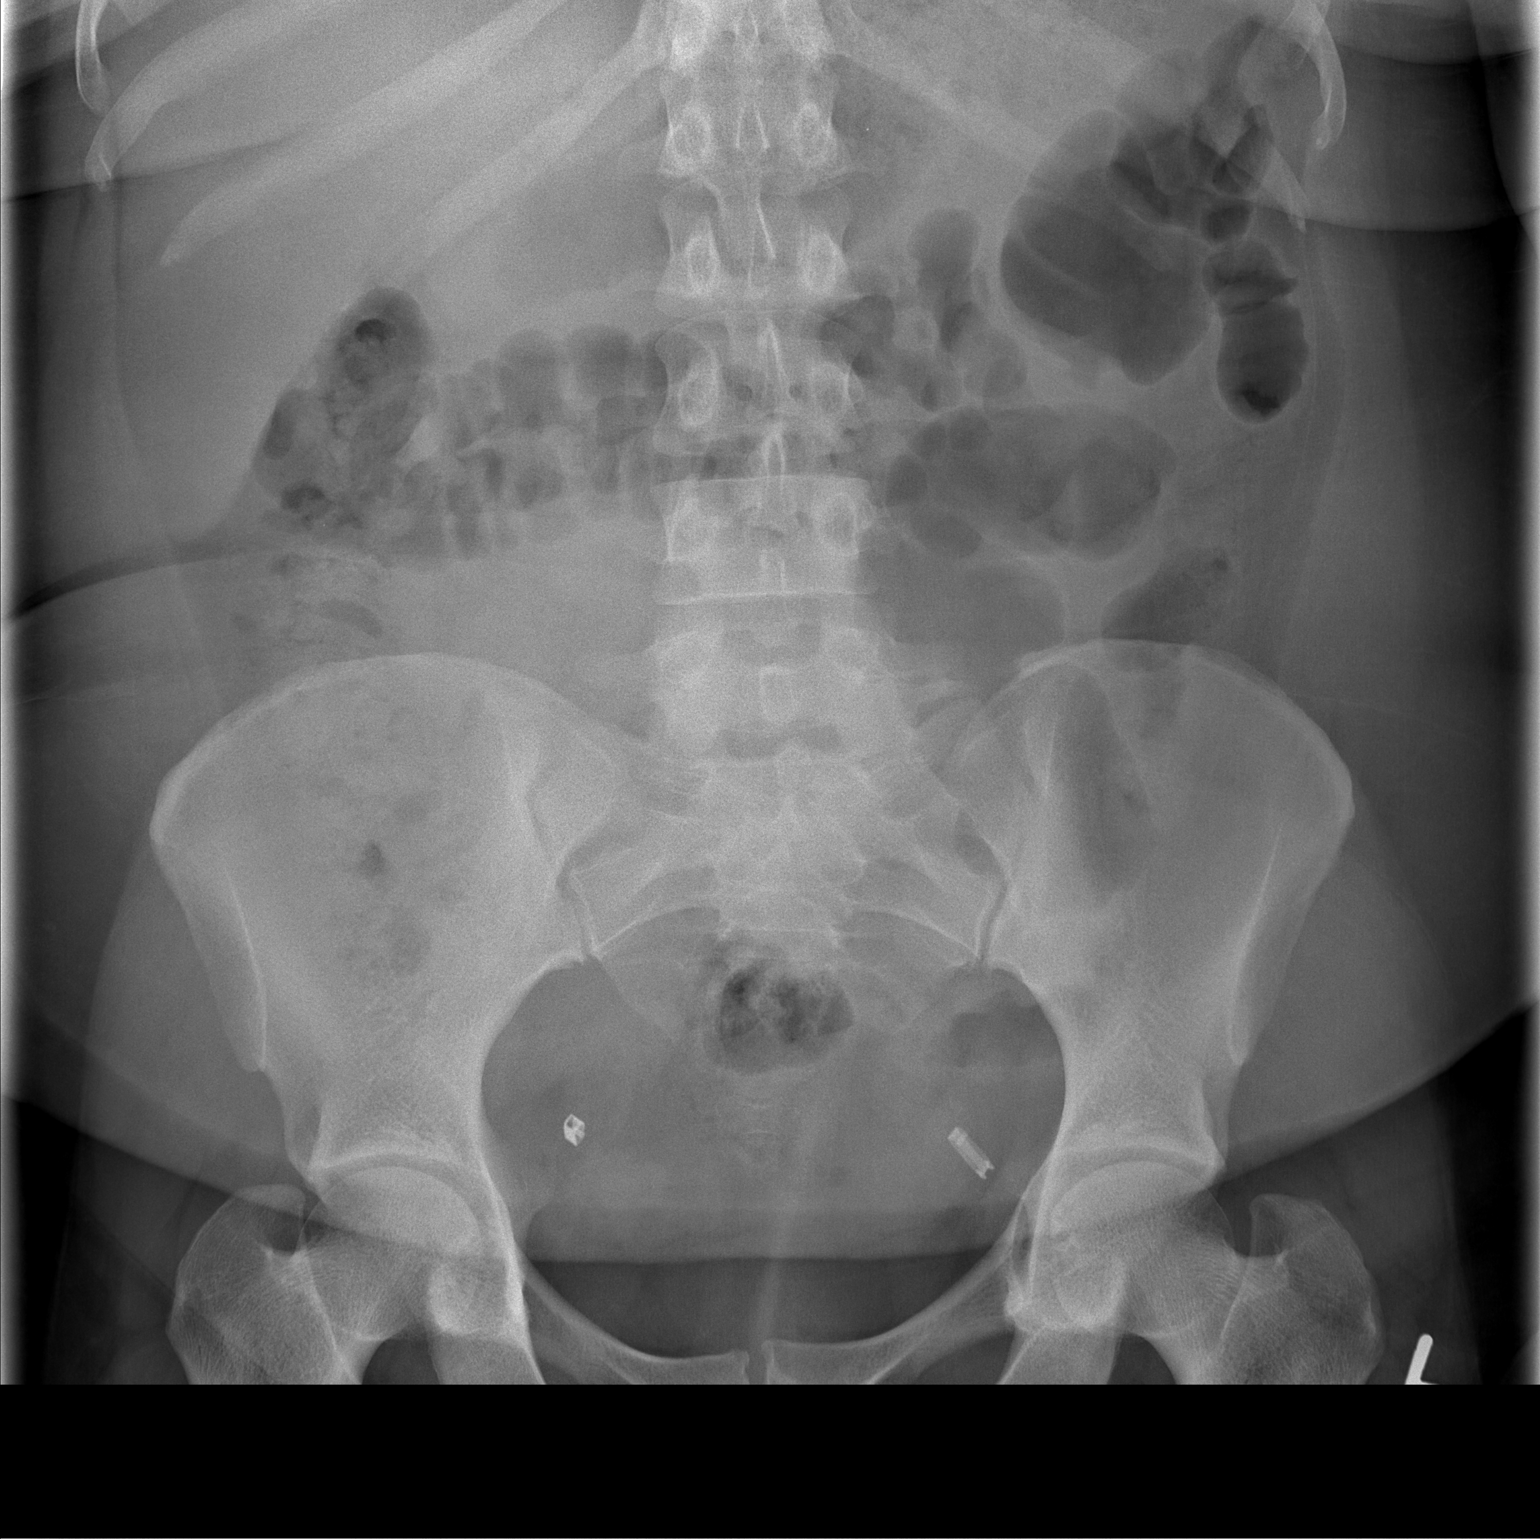

[2 of 2 positions shown; findings below may reference images not displayed]

FINDINGS: No free air or acute/specific abnormality of the bowel
gas pattern.  Prior bilateral tubal ligation.  No other acute or
significant findings.
IMPRESSION: No acute or specific findings - prior tubal ligation.

## 2010-12-16 ENCOUNTER — Encounter: Payer: Self-pay | Admitting: *Deleted

## 2010-12-16 ENCOUNTER — Encounter: Payer: Self-pay | Admitting: Obstetrics & Gynecology

## 2010-12-17 ENCOUNTER — Encounter: Payer: Self-pay | Admitting: *Deleted

## 2010-12-25 NOTE — Letter (Signed)
Summary: Generic Letter  [facility]  [address]   [place], [place] [address]   Phone: [phone]  Fax: [phone]          January 05, 2010  [facility] [address] [place], [place]  [address]  Dear Ms. Miralles,  It is time to call the clinic to schedule an appointment for your Annual PAP Smear.  Please call the number above to schedule this important yearly test.  I look forward to seeing you soon.    Thank you.   Sincerely,    Jennet Maduro RN

## 2010-12-25 NOTE — Miscellaneous (Signed)
Summary: RW Update  Clinical Lists Changes  Observations: Added new observation of HIV RISK BEH: Heterosexual contact (12/08/2009 15:06)

## 2010-12-25 NOTE — Progress Notes (Signed)
Summary: pt. to call back to make lab, MD and PAP appt.  Phone Note Outgoing Call   Call placed by: Jennet Maduro RN,  November 29, 2009 11:47 AM Call placed to: Patient Summary of Call: RN spoke with the pt.  RN requested that the pt. call back at [phone] to make and keep appt. for labs, MD and PAP smear.  Pt. verbalized understanding. Jennet Maduro RN  November 29, 2009 11:48 AM

## 2011-01-09 ENCOUNTER — Encounter: Payer: Self-pay | Admitting: Adult Health

## 2011-01-22 NOTE — Miscellaneous (Signed)
Summary: [place] [facility]  [facility]   Imported By: Florinda Marker 01/15/2011 14:58:49  _____________________________________________________________________  External Attachment:    Type:   Image     Comment:   External Document

## 2011-02-18 LAB — BASIC METABOLIC PANEL
CO2: 26 mEq/L (ref 19–32)
Calcium: 9.5 mg/dL (ref 8.4–10.5)
Glucose, Bld: 109 mg/dL — ABNORMAL HIGH (ref 70–99)
Sodium: 145 mEq/L (ref 135–145)

## 2011-02-18 LAB — URINE CULTURE: Colony Count: NO GROWTH

## 2011-02-18 LAB — URINALYSIS, ROUTINE W REFLEX MICROSCOPIC
Bilirubin Urine: NEGATIVE
Ketones, ur: 15 mg/dL — AB
Nitrite: NEGATIVE
Protein, ur: 100 mg/dL — AB
Specific Gravity, Urine: 1.03 (ref 1.005–1.030)
Urobilinogen, UA: 0.2 mg/dL (ref 0.0–1.0)

## 2011-02-18 LAB — DIFFERENTIAL
Basophils Absolute: 0.2 10*3/uL — ABNORMAL HIGH (ref 0.0–0.1)
Basophils Relative: 2 % — ABNORMAL HIGH (ref 0–1)
Eosinophils Relative: 0 % (ref 0–5)
Monocytes Absolute: 0.2 10*3/uL (ref 0.1–1.0)
Neutro Abs: 9.8 10*3/uL — ABNORMAL HIGH (ref 1.7–7.7)

## 2011-02-18 LAB — CBC
Hemoglobin: 14.5 g/dL (ref 12.0–15.0)
MCHC: 33.1 g/dL (ref 30.0–36.0)
RDW: 13.3 % (ref 11.5–15.5)

## 2011-02-18 LAB — GC/CHLAMYDIA PROBE AMP, GENITAL: GC Probe Amp, Genital: NEGATIVE

## 2011-02-27 LAB — URINE MICROSCOPIC-ADD ON: WBC, UA: NONE SEEN WBC/hpf (ref <3)

## 2011-02-27 LAB — URINALYSIS, ROUTINE W REFLEX MICROSCOPIC
Bilirubin Urine: NEGATIVE
Glucose, UA: NEGATIVE mg/dL
Ketones, ur: NEGATIVE mg/dL
Leukocytes, UA: NEGATIVE
Nitrite: NEGATIVE
Protein, ur: NEGATIVE mg/dL
Specific Gravity, Urine: 1.034 — ABNORMAL HIGH (ref 1.005–1.030)
Urobilinogen, UA: 0.2 mg/dL (ref 0.0–1.0)
pH: 5.5 (ref 5.0–8.0)

## 2011-02-27 LAB — GC/CHLAMYDIA PROBE AMP, GENITAL
Chlamydia, DNA Probe: NEGATIVE
GC Probe Amp, Genital: NEGATIVE

## 2011-02-27 LAB — POCT I-STAT, CHEM 8
Chloride: 105 mEq/L (ref 96–112)
HCT: 43 % (ref 36.0–46.0)
Potassium: 4.3 mEq/L (ref 3.5–5.1)

## 2011-02-27 LAB — WET PREP, GENITAL
Trich, Wet Prep: NONE SEEN
Yeast Wet Prep HPF POC: NONE SEEN

## 2011-02-27 LAB — PREGNANCY, URINE: Preg Test, Ur: NEGATIVE

## 2011-03-05 LAB — URINALYSIS, ROUTINE W REFLEX MICROSCOPIC
Glucose, UA: NEGATIVE mg/dL
Hgb urine dipstick: NEGATIVE
Specific Gravity, Urine: 1.02 (ref 1.005–1.030)

## 2011-03-06 LAB — POCT PREGNANCY, URINE: Preg Test, Ur: NEGATIVE

## 2011-03-06 LAB — WET PREP, GENITAL
Clue Cells Wet Prep HPF POC: NONE SEEN
Trich, Wet Prep: NONE SEEN

## 2011-03-06 LAB — GC/CHLAMYDIA PROBE AMP, GENITAL
Chlamydia, DNA Probe: NEGATIVE
GC Probe Amp, Genital: NEGATIVE

## 2011-03-06 LAB — POCT URINALYSIS DIP (DEVICE)
Glucose, UA: NEGATIVE mg/dL
Ketones, ur: NEGATIVE mg/dL
Specific Gravity, Urine: 1.025 (ref 1.005–1.030)
Urobilinogen, UA: 1 mg/dL (ref 0.0–1.0)

## 2011-03-11 LAB — T-HELPER CELL (CD4) - (RCID CLINIC ONLY): CD4 T Cell Abs: 1820 uL (ref 400–2700)

## 2011-04-09 NOTE — Group Therapy Note (Signed)
Penny Young, Penny Young NO.:  [id number]   MEDICAL RECORD NO.:  [id number]          PATIENT TYPE:  WOC   LOCATION:  [facility]                   FACILITY:  [facility]   PHYSICIAN:  Argentina Donovan, MD        DATE OF BIRTH:  June 15, 1986   DATE OF SERVICE:                                  CLINIC NOTE   ADDENDUM:  Please amend.  This patient is not breast-feeding.  She is  bottle-feeding.  She is HIV positive, and the patient is not breast-  feeding.   Please strike that from the interval history on the original report.     ______________________________  Maylon Cos, CNM    ______________________________  Argentina Donovan, MD    SS/MEDQ  D:  12/28/2008  T:  12/28/2008  Job:  [id number]

## 2011-04-09 NOTE — Op Note (Signed)
NAMEJOYCELYNN, FRITSCHE NO.:  [id number]   MEDICAL RECORD NO.:  [id number]         PATIENT TYPE:  WINP   LOCATION:                                [facility]   PHYSICIAN:  Lesly Dukes, M.D. DATE OF BIRTH:  Apr 01, 1986   DATE OF PROCEDURE:  10/23/2008  DATE OF DISCHARGE:                               OPERATIVE REPORT   DIAGNOSIS:  A 25 year old para 2 female with HIV desiring bilateral  tubal ligation.   POSTOPERATIVE DIAGNOSES:  77. A 25 year old para 2 female with HIV desiring bilateral tubal      ligation.  2. Peritoneal cyst.   PROCEDURES:  1. Postpartum bilateral tubal ligation with Filshie clips.  2. Biopsy of peritoneal cyst.   SURGEON:  Lesly Dukes, MD   ANESTHESIA:  Spinal.   SPECIMENS:  Peritoneal cyst and peritoneal washings to Pathology.   FINDINGS OF THE OPERATION:  Upon entering the abdominal cavity there was  some free fluid which was sent to Pathology as well as a little bit of  washings.  There were some peritoneal cysts cephalad to the umbilicus  and then some other peritoneal cysts that were draped down near the  bladder and more parts of the abdominal wall.  They do not seem to be  arising from any particular organ.  The ovaries were plump but appeared  normal. The limited view of the uterus was normal and the fallopian  tubes appeared normal.   PROCEDURE:  After informed consent was obtained the patient was taken to  the operating room where spinal anesthesia was found to be adequate.  The patient was placed in dorsal lithotomy position and prepared and  draped in normal sterile fashion.  The umbilicus was incised and the  peritoneal cavity was entered easily.  Retractor was placed into the  abdomen and that is where the peritoneal cysts were found.  The incision  was extended 2 cm in order to get better visualization of where the  cysts were coming from and they do not seem to be arising from any  particular organ.  The  findings are dictated above.  Each fallopian tube  was identified and followed out to its fimbriated end and a Filshie clip  was placed transversely across each fallopian tube in the proximal third  of each fallopian tube.  There was good hemostasis.  The fascia was  closed with 0 Vicryl in a running fashion and the subcutaneous tissue  was closed with 3-0 Vicryl in subcuticular fashion.  The patient  tolerated the procedure well.  Sponge, lap, instrument, and needle count  correct x2 and the patient went to recovery room in stable condition.     Lesly Dukes, M.D.  Electronically Signed    KHL/MEDQ  D:  10/23/2008  T:  10/23/2008  Job:  [id number]

## 2011-04-09 NOTE — Group Therapy Note (Signed)
Penny Young, GUISE                ACCOUNT NO.:  [id number]   MEDICAL RECORD NO.:  [id number]          PATIENT TYPE:  WOC   LOCATION:  [facility]                   [facility]   PHYSICIAN:  Maylon Cos, CNM    DATE OF BIRTH:  Jun 16, 1986   DATE OF SERVICE:  12/21/2008                                  CLINIC NOTE   The patient presents for a postpartum visit.  She is status post a  vaginal delivery and BTL on October 23, 2008.  The patient's past  medical history is significant for being HIV positive, hepatitis C  positive.  She has a history of Trichomonas, chlamydia, syphilis and  pregnancy.  She has chronic hypertension and substance abuse including  cocaine.  She has a history of asthma, bipolar, schizophrenia.   Her current medication regimen includes:  1. Combivir.  2. Kaletra.  3. Prenatal vitamins.  4. Labetalol 100 mg once today.  5. She also uses Colace p.r.n. as needed for constipation.   The patient had a vaginal delivery on November 29 that resulted in a  female infant weighing 7 pounds 1 ounce.  That birth was attended by Philipp Deputy, CNM.   INTERVAL HISTORY:  Includes the patient was evaluated by Dr. Philipp Deputy in  ID earlier today and states that Dr. Philipp Deputy was very happy with her  numbers.  Her CD-4 count according to the echart system was 60, and her  T helper cells were greater than 1300.  Dr. Philipp Deputy encouraged the  patient to stop her antivirals; however, the patient declined stopping  antivirals at this time and desires to stay on them.  According to the  patient, Dr. Philipp Deputy is okay with this plan.  Also, according to the  patient, she stopped vaginal bleeding approximately one week ago, seven  weeks status post her vaginal delivery.  She has not resumed  intercourse.  She has had no signs and symptoms of engorgement.  She  feels like emotionally that she is very stable.  She is not having any  signs and symptoms of postpartum depression and feels like  she is  getting adequate sleep.  She is sleeping approximately eight hours  nightly now that the baby is sleeping throughout the night.  She is  nursing without difficulty.  She is down 13 pounds since delivery.   PHYSICAL EXAMINATION:  Her vital signs are stable.  Temperature is 97.6,  pulse is 67, respirations 20.  Her blood pressure is 133/82.  Her weight  today is 244.  In general, she is a very pleasant African American  female who appears to be older than her stated age of 66.  She is in no  apparent distress.  Her exam is going to be problem focused today  secondary to complaints of foul-smelling vaginal discharge.  On  examination of the genitalia, perineum is intact.  There is a scant  amount of white creamy discharge noted at the introitus.  She is Tanner  5.  Bimanual exam was deferred.   ASSESSMENT/PLAN:  The patient is 7 weeks status post from a spontaneous  vaginal  delivery and BTL doing well.  She has vaginal irritation.  Wet  prep was sent for analysis.  The patient will be called if the need of  antibiotics is necessary.  We will continue the patient on her labetalol  100 mg daily at current secondary to her history.  Her blood pressure is  deemed well controlled on 133/82.  The patient is to return in 4 weeks  for blood pressure check at  which time we will make determination of need for continuation of her  labetalol and referral for primary care Est[facility] Llerena outside of GYN and ID.           ______________________________  Maylon Cos, CNM     SS/MEDQ  D:  12/21/2008  T:  12/21/2008  Job:  [id number]

## 2011-04-09 NOTE — Discharge Summary (Signed)
Penny Young, Penny Young                ACCOUNT NO.:  [id number]   MEDICAL RECORD NO.:  [id number]          PATIENT TYPE:  INP   LOCATION:  9104                          [facility]   PHYSICIAN:  Lesly Dukes, M.D. DATE OF BIRTH:  11-03-1986   DATE OF ADMISSION:  10/21/2008  DATE OF DISCHARGE:  10/25/2008                               DISCHARGE SUMMARY   ADMISSION DIAGNOSIS:  Onset of labor.   DISCHARGE DIAGNOSES:  Spontaneous vaginal delivery, human  immunodeficiency virus, and chronic hypertension.   PROCEDURES PERFORMED:  Nonstress test and postpartum tubal ligation.   COMPLICATIONS:  None.   HISTORY AND HOSPITAL COURSE:  Briefly, this is a G6, P 2-0-4-2 female  who underwent a normal spontaneous vaginal delivery at 38.3 weeks,  delivered a female, 7 pounds 1 ounce with Apgars of 9 at one and 9 at  five with no lacerations.  Cord blood sent.  Spontaneous placenta.  Estimated blood loss 250 mL.  Bilateral tubal ligation postpartum.   LABORATORY DATA:  HIV positive, RPR nonreactive, hepatitis B surface  antigen negative, blood B positive, rubella nonimmune, chlamydia  positive, GC negative, GBS positive.   Activity at discharge will be unrestricted.  Diet will be routine.   DISCHARGE MEDICATIONS:  1. Prenatal vitamins 1 tablet p.o. daily.  2. Percocet 1 tablet p.o. q.6 h. p.r.n. pain.  3. Colace 100 mg p.o. b.i.d. p.r.n. constipation.  4. Kaletra 2 tablets p.o. b.i.d.  5. Combivir 1 tablet p.o. b.i.d.  6. Labetalol 100 mg p.o. b.i.d.  7. Ibuprofen 600 mg p.o. q.6 h. p.r.n. pain.   DISCHARGE CONDITION:  Stable.   Disposition will be to home.  Followup will be in 6 weeks at the  [facility] Department.      Rodney Langton, MD      Lesly Dukes, M.D.  Electronically Signed    TT/MEDQ  D:  12/02/2008  T:  12/03/2008  Job:  [id number]

## 2011-04-12 NOTE — H&P (Signed)
Penny Young, KINDEL NO.:  [id number]   MEDICAL RECORD NO.:  [id number]          PATIENT TYPE:  MAT   LOCATION:  [facility]                          [facility]   PHYSICIAN:  Lesly Dukes, M.D. DATE OF BIRTH:  November 30, 1985   DATE OF ADMISSION:  02/04/2005  DATE OF DISCHARGE:                                HISTORY & PHYSICAL   HISTORY OF PRESENT ILLNESS:  The patient is a 25 year old female who was  getting prenatal care at [facility].  Her LMP was sometime in December,  and she had initial prenatal laboratories drawn.  She was noted to be HIV  positive with a confirmatory Western Blot also being positive.  She was  supposed to come to [facility] today for the results of this test;  however, she was having some vaginal bleeding, so she went to [facility].  The [facility] worked her up for a miscarriage.  They saw an intrauterine  pregnancy with a fetal pole measuring approximately 8 weeks, 2 days EGA,  with an irregular sac.  Her beta hCG at that time was 1900, and there was no  current vaginal bleeding.  Details of this visit is in the E chart.  The  patient was not anemic and Rh positive.  She was sent here for further  consultation about what to do for the miscarriage.  The patient was  sequestered, and her sister was placed in the waiting room, and was told  that she was HIV positive.  She had previously been having sex with a known  IV drug user.  She had had a negative test several months ago.  She did not  appear surprised.  She was slightly tearful, but was handling the news  remarkably well.  Infectious disease was called, and they need her records  before she can have an appointment.  These were requested from [facility] to be mailed to the Infectious Disease Clinic.  The patient would not  give a phone number, so the patient will call the infectious disease office  for an appointment in several days.  In the meantime, the sister was  brought  back in the room, and we decided to do a D&C.  I think this was the safest,  based on patient reliability, and also that the patient could be highly  infectious at this time, and I would not recommend her having heavy vaginal  bleeding around her family members.  They seemed that they would be very  helpful in aiding in her pain and bleeding, and possibly contract the  disease.  The patient is agreeable to the plan for D&C.  She gets her work  schedule on Wednesday, and will call my office on Wednesday.  She was given  the number, [phone].  Once I know her work schedule, we will then schedule  the D&C based around her job.  The patient was given ethic for miscarriage.  All questions were answered regarding both HIV and miscarriage and D&C.      KHL/MEDQ  D:  02/04/2005  T:  02/04/2005  Job:  [id number]

## 2011-04-12 NOTE — Discharge Summary (Signed)
[name], Penny Young NO.:  [id number]   MEDICAL RECORD NO.:  [id number]          PATIENT TYPE:  IPS   LOCATION:  0307                          [facility]ILITY[facility]   PHYSICIAN:  Anselm Jungling, MD  DATE OF BIRTH:  10-Nov-1986   DATE OF ADMISSION:  10/13/2006  DATE OF DISCHARGE:  10/15/2006                               DISCHARGE SUMMARY   IDENTIFYING DATA AND REASON FOR ADMISSION:  The patient is a 25 year old  single black female who was admitted for cocaine abuse, depression, and  suicidal ideation.  She was admitted on an involuntary basis.  She had  no prior inpatient treatment history.  She had been abusing cocaine  since age 59.  Her 51-year-old child was in the custody of others.  She  had been living with her father in [place] and was unemployed.  At  the time of admission she stated that she did not feel she needed  inpatient treatment, but needed some form of outside support group.  She described her father as alcoholic.  She denied other drugs except  for occasional marijuana.  Please refer to the admission note for  further details pertaining to the symptoms, circumstances and history  that led to HER hospitalization.  She was given initial Axis I diagnoses  of cocaine dependence and rule out mood disorder NOS.   MEDICAL AND LABORATORY:  The patient was medically and physically  assessed by the psychiatric nurse practitioner.  She was in good health  without any active or chronic medical problems.   HOSPITAL COURSE:  The patient was admitted to the adult inpatient  psychiatric service.  To address depressive symptoms she was placed on a  trial of Wellbutrin XL 150 mg daily which was well tolerated.  To assist  in falling asleep she was given trazodone 50 mg at bedtime as needed  with good results.  Symmetrel 100 mg twice daily was given for cocaine  cravings.   She participated in various therapeutic groups and activities including  groups oriented  towards 12-step recovery.  She worked with the Arts development officer regarding aftercare planning.  She indicated that she was open to  trying alcoholics anonymous or narcotics anonymous.  She was also open  to going to [facility] of the [place] for counseling, and was  requesting a Veterinary surgeon with specific credentials in substance abuse.   By the third hospital day the patient appeared to be appropriate for  discharge.  She was tolerating medications had been absent of suicidal  ideation throughout.  She was open and agreeable to following the below  referenced aftercare plan.   AFTERCARE:  The patient was to go to [facility] of the [place] for  an intake appointment on 10/23/2006, and to the [facility] for an  intake appointment on 10/28/2006.   DISCHARGE MEDICATIONS:  Symmetrel 100 mg twice daily, Wellbutrin XL 150  mg daily, and trazodone 50 mg at bedtime as needed for insomnia.   DISCHARGE DIAGNOSES:  AXIS I: Cocaine dependence, early remission,  depressive disorder NOS.  AXIS II: Deferred.  AXIS III: No acute or chronic illnesses.  AXIS IV: Stressors severe.  AXIS V: GAF on discharge 60.      Anselm Jungling, MD  Electronically Signed     SPB/MEDQ  D:  11/12/2006  T:  11/13/2006  Job:  [id number] [id number]

## 2011-08-20 LAB — DIFFERENTIAL
Basophils Absolute: 0
Eosinophils Absolute: 0.3
Lymphs Abs: 3.2
Monocytes Absolute: 0.4
Neutrophils Relative %: 55

## 2011-08-20 LAB — URINALYSIS, ROUTINE W REFLEX MICROSCOPIC
Bilirubin Urine: NEGATIVE
Bilirubin Urine: NEGATIVE
Ketones, ur: NEGATIVE
Ketones, ur: NEGATIVE
Nitrite: NEGATIVE
Nitrite: NEGATIVE
Protein, ur: NEGATIVE
Specific Gravity, Urine: 1.025
Urobilinogen, UA: 0.2
pH: 6

## 2011-08-20 LAB — WET PREP, GENITAL: Yeast Wet Prep HPF POC: NONE SEEN

## 2011-08-20 LAB — CBC
HCT: 37.9
Hemoglobin: 13.4
Hemoglobin: 13.2
MCHC: 34.3
MCHC: 34.7
Platelets: 308
RBC: 4.77
RDW: 14.5
WBC: 8.7

## 2011-08-20 LAB — GC/CHLAMYDIA PROBE AMP, GENITAL: Chlamydia, DNA Probe: POSITIVE — AB

## 2011-08-20 LAB — URINE MICROSCOPIC-ADD ON

## 2011-08-20 LAB — COMPREHENSIVE METABOLIC PANEL
ALT: 13
Alkaline Phosphatase: 75
CO2: 24
Calcium: 8.9
Chloride: 105
GFR calc non Af Amer: >60
Glucose, Bld: 80
Sodium: 135
Total Bilirubin: 0.7

## 2011-08-20 LAB — POCT I-STAT, CHEM 8
Calcium, Ion: 1.22
Creatinine, Ser: 0.8
Glucose, Bld: 78
HCT: 40
Hemoglobin: 13.6

## 2011-08-21 LAB — POCT URINALYSIS DIP (DEVICE)
Ketones, ur: NEGATIVE
Protein, ur: 30 — AB
Specific Gravity, Urine: 1.02
Urobilinogen, UA: 0.2
pH: 7

## 2011-08-22 LAB — URINE MICROSCOPIC-ADD ON

## 2011-08-22 LAB — POCT URINALYSIS DIP (DEVICE)
Bilirubin Urine: NEGATIVE
Glucose, UA: NEGATIVE
Hgb urine dipstick: NEGATIVE
Specific Gravity, Urine: 1.02

## 2011-08-22 LAB — URINALYSIS, ROUTINE W REFLEX MICROSCOPIC
Glucose, UA: NEGATIVE
Hgb urine dipstick: NEGATIVE
Specific Gravity, Urine: 1.015
Urobilinogen, UA: 0.2

## 2011-08-22 LAB — URINE CULTURE: Colony Count: >=100000

## 2011-08-22 LAB — WET PREP, GENITAL

## 2011-08-26 LAB — POCT URINALYSIS DIP (DEVICE)
Glucose, UA: NEGATIVE
Glucose, UA: NEGATIVE
Glucose, UA: NEGATIVE
Glucose, UA: NEGATIVE
Nitrite: NEGATIVE
Nitrite: NEGATIVE
Nitrite: NEGATIVE
Nitrite: NEGATIVE
Operator id: 194561
Operator id: 287931
Operator id: 297281
Operator id: 297281
Protein, ur: 100 — AB
Protein, ur: NEGATIVE
Specific Gravity, Urine: 1.025
Specific Gravity, Urine: 1.025
Urobilinogen, UA: 0.2
Urobilinogen, UA: 0.2
Urobilinogen, UA: 0.2
Urobilinogen, UA: 1

## 2011-08-26 LAB — T-HELPER CELL (CD4) - (RCID CLINIC ONLY): CD4 % Helper T Cell: 60 — ABNORMAL HIGH

## 2011-08-27 LAB — URINALYSIS, ROUTINE W REFLEX MICROSCOPIC
Bilirubin Urine: NEGATIVE
Ketones, ur: NEGATIVE
Nitrite: NEGATIVE
Protein, ur: NEGATIVE
Urobilinogen, UA: 0.2
pH: 5.5

## 2011-08-27 LAB — COMPREHENSIVE METABOLIC PANEL
ALT: 21 U/L (ref 0–35)
AST: 28 U/L (ref 0–37)
Albumin: 3 g/dL — ABNORMAL LOW (ref 3.5–5.2)
CO2: 21 mEq/L (ref 19–32)
Calcium: 8.8 mg/dL (ref 8.4–10.5)
Chloride: 107 mEq/L (ref 96–112)
Creatinine, Ser: 0.57 mg/dL (ref 0.4–1.2)
GFR calc Af Amer: >60 mL/min (ref >60)
GFR calc non Af Amer: >60 mL/min (ref >60)
Sodium: 135 mEq/L (ref 135–145)

## 2011-08-27 LAB — POCT URINALYSIS DIP (DEVICE)
Nitrite: NEGATIVE
Nitrite: NEGATIVE
Nitrite: NEGATIVE
Operator id: 287931
Operator id: 297281
Protein, ur: 30 — AB
Protein, ur: NEGATIVE
Protein, ur: NEGATIVE
Urobilinogen, UA: 0.2
Urobilinogen, UA: 0.2
pH: 6.5
pH: 6.5
pH: 7

## 2011-08-27 LAB — CBC
MCHC: 34.6 g/dL (ref 30.0–36.0)
Platelets: 223 10*3/uL (ref 150–400)
RBC: 3.5 MIL/uL — ABNORMAL LOW (ref 3.87–5.11)
WBC: 8.5 10*3/uL (ref 4.0–10.5)

## 2011-08-27 LAB — URIC ACID: Uric Acid, Serum: 3.9 mg/dL (ref 2.4–7.0)

## 2011-08-27 LAB — URINE MICROSCOPIC-ADD ON

## 2011-08-28 LAB — POCT URINALYSIS DIP (DEVICE)
Bilirubin Urine: NEGATIVE
Glucose, UA: NEGATIVE
Hgb urine dipstick: NEGATIVE
Nitrite: NEGATIVE

## 2011-09-02 LAB — I-STAT 8, (EC8 V) (CONVERTED LAB)
Acid-base deficit: 1
Bicarbonate: 25 — ABNORMAL HIGH
Chloride: 105
HCT: 40
Operator id: 198171
pCO2, Ven: 48

## 2011-09-02 LAB — POCT I-STAT CREATININE: Creatinine, Ser: 0.8

## 2011-09-03 LAB — URINALYSIS, ROUTINE W REFLEX MICROSCOPIC
Glucose, UA: NEGATIVE
Leukocytes, UA: NEGATIVE
pH: 7

## 2011-09-03 LAB — CBC
MCHC: 33.5
MCV: 81.9
Platelets: 289

## 2011-09-03 LAB — GC/CHLAMYDIA PROBE AMP, GENITAL: GC Probe Amp, Genital: NEGATIVE

## 2011-09-03 LAB — URINE MICROSCOPIC-ADD ON

## 2011-09-03 LAB — WET PREP, GENITAL: Yeast Wet Prep HPF POC: NONE SEEN

## 2011-09-04 LAB — WET PREP, GENITAL: Yeast Wet Prep HPF POC: NONE SEEN

## 2011-09-04 LAB — POCT PREGNANCY, URINE: Operator id: 247071

## 2011-09-06 LAB — WET PREP, GENITAL
Trich, Wet Prep: NONE SEEN
Yeast Wet Prep HPF POC: NONE SEEN

## 2011-09-06 LAB — GC/CHLAMYDIA PROBE AMP, GENITAL: Chlamydia, DNA Probe: NEGATIVE

## 2011-09-06 LAB — POCT PREGNANCY, URINE: Operator id: 239701

## 2011-09-06 LAB — POCT URINALYSIS DIP (DEVICE)
Bilirubin Urine: NEGATIVE
Glucose, UA: NEGATIVE
Ketones, ur: NEGATIVE
pH: 6

## 2011-09-10 LAB — CBC
Hemoglobin: 13.3
RBC: 4.92

## 2011-09-10 LAB — URINALYSIS, ROUTINE W REFLEX MICROSCOPIC
Bilirubin Urine: NEGATIVE
Glucose, UA: NEGATIVE
Hgb urine dipstick: NEGATIVE
Specific Gravity, Urine: 1.02
pH: 7

## 2011-09-10 LAB — WET PREP, GENITAL
Trich, Wet Prep: NONE SEEN
Yeast Wet Prep HPF POC: NONE SEEN

## 2011-09-10 LAB — POCT PREGNANCY, URINE: Preg Test, Ur: NEGATIVE

## 2011-09-10 LAB — GC/CHLAMYDIA PROBE AMP, GENITAL
Chlamydia, DNA Probe: NEGATIVE
GC Probe Amp, Genital: NEGATIVE

## 2012-04-07 ENCOUNTER — Encounter: Payer: Self-pay | Admitting: *Deleted

## 2012-08-06 ENCOUNTER — Encounter: Payer: Self-pay | Admitting: *Deleted

## 2012-10-02 ENCOUNTER — Encounter (HOSPITAL_COMMUNITY): Payer: Self-pay | Admitting: Emergency Medicine

## 2012-10-02 ENCOUNTER — Emergency Department (HOSPITAL_COMMUNITY)
Admission: EM | Admit: 2012-10-02 | Discharge: 2012-10-02 | Disposition: A | Discharge status: Home / Self Care | Payer: Medicaid Other | Attending: Emergency Medicine | Admitting: Emergency Medicine

## 2012-10-02 DIAGNOSIS — J45909 Unspecified asthma, uncomplicated: Secondary | ICD-10-CM | POA: Insufficient documentation

## 2012-10-02 DIAGNOSIS — Y9289 Other specified places as the place of occurrence of the external cause: Secondary | ICD-10-CM | POA: Insufficient documentation

## 2012-10-02 DIAGNOSIS — T1500XA Foreign body in cornea, unspecified eye, initial encounter: Secondary | ICD-10-CM | POA: Insufficient documentation

## 2012-10-02 DIAGNOSIS — T1590XA Foreign body on external eye, part unspecified, unspecified eye, initial encounter: Secondary | ICD-10-CM

## 2012-10-02 DIAGNOSIS — Z8659 Personal history of other mental and behavioral disorders: Secondary | ICD-10-CM | POA: Insufficient documentation

## 2012-10-02 DIAGNOSIS — B20 Human immunodeficiency virus [HIV] disease: Secondary | ICD-10-CM | POA: Insufficient documentation

## 2012-10-02 DIAGNOSIS — W268XXA Contact with other sharp object(s), not elsewhere classified, initial encounter: Secondary | ICD-10-CM | POA: Insufficient documentation

## 2012-10-02 DIAGNOSIS — Z87891 Personal history of nicotine dependence: Secondary | ICD-10-CM | POA: Insufficient documentation

## 2012-10-02 DIAGNOSIS — Z8619 Personal history of other infectious and parasitic diseases: Secondary | ICD-10-CM | POA: Insufficient documentation

## 2012-10-02 DIAGNOSIS — F209 Schizophrenia, unspecified: Secondary | ICD-10-CM | POA: Insufficient documentation

## 2012-10-02 DIAGNOSIS — Y9389 Activity, other specified: Secondary | ICD-10-CM | POA: Insufficient documentation

## 2012-10-02 MED ORDER — OXYCODONE-ACETAMINOPHEN 5-325 MG PO TABS
2.0000 | ORAL_TABLET | Freq: Once | ORAL | Status: AC
  Administered 2012-10-02: 2 via ORAL
  Filled 2012-10-02: qty 2

## 2012-10-02 MED ORDER — FLUORESCEIN SODIUM 1 MG OP STRP
1.0000 | ORAL_STRIP | Freq: Once | OPHTHALMIC | Status: AC
  Administered 2012-10-02: 1 via OPHTHALMIC
  Filled 2012-10-02: qty 1

## 2012-10-02 MED ORDER — TETRACAINE HCL 0.5 % OP SOLN
2.0000 [drp] | Freq: Once | OPHTHALMIC | Status: AC
  Administered 2012-10-02: 2 [drp] via OPHTHALMIC
  Filled 2012-10-02: qty 2

## 2012-10-02 MED ORDER — DIAZEPAM 5 MG PO TABS
5.0000 mg | ORAL_TABLET | Freq: Once | ORAL | Status: AC
  Administered 2012-10-02: 5 mg via ORAL
  Filled 2012-10-02: qty 1

## 2012-10-02 NOTE — ED Provider Notes (Signed)
Medical screening examination/treatment/procedure(s) were performed by non-physician practitioner and as supervising physician I was immediately available for consultation/collaboration.   Glynn Octave, MD 10/02/12 [phone]

## 2012-10-02 NOTE — ED Notes (Signed)
Visual Acuity: both eyes 20/30; L eye/unaffected eye 20/20; R eye/affected eye 20/70.

## 2012-10-02 NOTE — ED Notes (Signed)
Pt c/o right eye pain with redness and discharge starting this am

## 2012-10-02 NOTE — ED Provider Notes (Signed)
History     CSN: [id number]  Arrival date & time 10/02/12  4098   First MD Initiated Contact with Patient 10/02/12 1010      Chief Complaint  Patient presents with  . Eye Pain    (Consider location/radiation/quality/duration/timing/severity/associated sxs/prior treatment) HPI Comments: Patient is a 26 year old female who presents with right eye pain that started this morning upon waking. Yesterday, the patient dropped a glass pitcher, which shattered and a piece of glass may have gotten in her eye as she noticed irritation yesterday which progressively worsened over night. The pain does not radiate and is described as severe irritation. No aggravating/alleviating factors. Patient reports associated blurry vision in her right eye, right eye discharge and swelling around her right eye. Patient tried flushing the eye without relief. Patient denies fever/chills, NVD, headache, wound, sore throat, chest pain, SOB, abdominal pain, any other injury.   Patient is a 26 y.o. female presenting with eye pain.  Eye Pain    Past Medical History  Diagnosis Date  . Bipolar 1 disorder   . Schizophrenia   . Hepatitis B   . HIV (human immunodeficiency virus infection)   . Asthma   . Drug abuse     Past Surgical History  Procedure Date  . Tympanostomy tube placement     Family History  Problem Relation Age of Onset  . Hypertension      History  Substance Use Topics  . Smoking status: Former Smoker    Quit date: 11/26/2007  . Smokeless tobacco: Not on file  . Alcohol Use: No    OB History    Grav Para Term Preterm Abortions TAB SAB Ect Mult Living                  Review of Systems  Eyes: Positive for pain and redness.  All other systems reviewed and are negative.    Allergies  Review of patient's allergies indicates no known allergies.  Home Medications   Current Outpatient Rx  Name  Route  Sig  Dispense  Refill  . LABETALOL HCL 100 MG PO TABS   Oral   Take 100 mg  by mouth 2 (two) times daily.           BP 140/85  Pulse 110  Temp 98 F (36.7 C) (Oral)  Resp 20  SpO2 100%  Physical Exam  Nursing note and vitals reviewed. Constitutional: She is oriented to person, place, and time. She appears well-developed and well-nourished. No distress.  HENT:  Head: Normocephalic and atraumatic.  Right Ear: External ear normal.  Left Ear: External ear normal.  Mouth/Throat: No oropharyngeal exudate.  Eyes: Conjunctivae normal and EOM are normal. Pupils are equal, round, and reactive to light.       Right periorbital swelling. Mild right corneal injection. No discharge noted.   Neck: Normal range of motion. Neck supple.  Cardiovascular: Normal rate and regular rhythm.  Exam reveals no gallop and no friction rub.   No murmur heard. Pulmonary/Chest: Effort normal and breath sounds normal. She has no wheezes. She has no rales. She exhibits no tenderness.  Abdominal: Soft. She exhibits no distension.  Musculoskeletal: Normal range of motion.  Lymphadenopathy:    She has no cervical adenopathy.  Neurological: She is alert and oriented to person, place, and time. Coordination normal.       Speech is goal-oriented. Moves limbs without ataxia.   Skin: Skin is warm and dry. She is not diaphoretic.  Psychiatric: She has a normal mood and affect. Her behavior is normal.    ED Course  Procedures (including critical care time)  Labs Reviewed - No data to display No results found.   1. Foreign body in eye       MDM  10:35 AM I will look in the patient's affected eye with a wood's lamp to assess any foreign body or corneal abrasion.   Dr. Rosalia Hammers and I looked at the patient's eye and did not see any foreign body. I spoke with Dr. Delaney Meigs who will see the patient. Patient will follow up with Dr. Delaney Meigs today in the office for further evaluation.      Emilia Beck, PA-C 10/02/12 1553

## 2012-10-05 ENCOUNTER — Emergency Department (HOSPITAL_COMMUNITY)
Admission: EM | Admit: 2012-10-05 | Discharge: 2012-10-05 | Disposition: A | Discharge status: Home / Self Care | Payer: Medicaid Other | Attending: Emergency Medicine | Admitting: Emergency Medicine

## 2012-10-05 ENCOUNTER — Encounter (HOSPITAL_COMMUNITY): Payer: Self-pay | Admitting: Emergency Medicine

## 2012-10-05 DIAGNOSIS — H109 Unspecified conjunctivitis: Secondary | ICD-10-CM | POA: Insufficient documentation

## 2012-10-05 DIAGNOSIS — B1920 Unspecified viral hepatitis C without hepatic coma: Secondary | ICD-10-CM | POA: Insufficient documentation

## 2012-10-05 DIAGNOSIS — J45909 Unspecified asthma, uncomplicated: Secondary | ICD-10-CM | POA: Insufficient documentation

## 2012-10-05 DIAGNOSIS — F17200 Nicotine dependence, unspecified, uncomplicated: Secondary | ICD-10-CM | POA: Insufficient documentation

## 2012-10-05 DIAGNOSIS — Z8619 Personal history of other infectious and parasitic diseases: Secondary | ICD-10-CM | POA: Insufficient documentation

## 2012-10-05 DIAGNOSIS — B20 Human immunodeficiency virus [HIV] disease: Secondary | ICD-10-CM | POA: Insufficient documentation

## 2012-10-05 DIAGNOSIS — Z8659 Personal history of other mental and behavioral disorders: Secondary | ICD-10-CM | POA: Insufficient documentation

## 2012-10-05 MED ORDER — POLYMYXIN B-TRIMETHOPRIM 10000-0.1 UNIT/ML-% OP SOLN: 1.0000 [drp] | OPHTHALMIC | Status: DC

## 2012-10-05 MED ORDER — OLOPATADINE HCL 0.1 % OP SOLN: 1.0000 [drp] | Freq: Two times a day (BID) | OPHTHALMIC | Status: DC

## 2012-10-05 MED ORDER — FLUORESCEIN SODIUM 1 MG OP STRP
1.0000 | ORAL_STRIP | Freq: Once | OPHTHALMIC | Status: AC
  Administered 2012-10-05: 1 via OPHTHALMIC
  Filled 2012-10-05: qty 1

## 2012-10-05 NOTE — ED Provider Notes (Signed)
History   This chart was scribed for Hurman Horn, MD by Gerlean Ren, ED Scribe. This patient was seen in room TR04C/TR04C and the patient's care was started at 7:02 PM    CSN: [id number]  Arrival date & time 10/05/12  1714   First MD Initiated Contact with Patient 10/05/12 1900      Chief Complaint  Patient presents with  . Eye Pain    (Consider location/radiation/quality/duration/timing/severity/associated sxs/prior treatment) The history is provided by the patient. No language interpreter was used.   Penny Young is a 27 y.o. female who presents to the Emergency Department complaining of constant, non-improving right eye pain with redness and discharge for which pt was seen here 2-3 days ago.  Pt was told to see ophthalmologist that day but never went.  Pt states pain began as a foreign body sensation with swelling, itching, and redness that has continued since.  Pt denies any loss of vision, fever, dyspnea, rhinorrhea, chest pain, and cough as associated.  Pt does not use glasses or contacts.  Pt has h/o bipolar disorder, schizophrenia, hepatitis B, and HIV.  Pt is a current everyday smoker and reports occasional alcohol use.   Past Medical History  Diagnosis Date  . Bipolar 1 disorder   . Schizophrenia   . Hepatitis B   . HIV (human immunodeficiency virus infection)   . Asthma   . Drug abuse     Past Surgical History  Procedure Date  . Tympanostomy tube placement     Family History  Problem Relation Age of Onset  . Hypertension      History  Substance Use Topics  . Smoking status: Current Every Day Smoker    Last Attempt to Quit: 11/26/2007  . Smokeless tobacco: Not on file  . Alcohol Use: Yes     Comment: occ    No OB history provided.  Review of Systems 10 Systems reviewed and are negative for acute change except as noted in the HPI.  Allergies  Review of patient's allergies indicates no known allergies.  Home Medications   Current Outpatient Rx    Name  Route  Sig  Dispense  Refill  . OLOPATADINE HCL 0.1 % OP SOLN   Right Eye   Place 1 drop into the right eye 2 (two) times daily. X 7 days   5 mL   12   . POLYMYXIN B-TRIMETHOPRIM 10000-0.1 UNIT/ML-% OP SOLN   Right Eye   Place 1 drop into the right eye every 4 (four) hours.   10 mL   0     BP 140/97  Pulse 97  Temp 97.8 F (36.6 C)  Resp 20  SpO2 99%  Physical Exam  Nursing note and vitals reviewed. Constitutional:       Awake, alert, nontoxic appearance.  HENT:  Head: Atraumatic.  Eyes: EOM are normal. Pupils are equal, round, and reactive to light.       Peripheral visual fields intact to confrontation.  Right eye injected with no foreign body noted on cornea with slit lamp.  Mild edema to right eyelids without erythema or induration.  No fluoro uptake.  Anterior chamber quiet.    Neck: Neck supple.  Cardiovascular: Normal rate, regular rhythm and normal heart sounds.   Pulmonary/Chest: Effort normal. She exhibits no tenderness.  Abdominal: Soft. There is no tenderness. There is no rebound.  Musculoskeletal: She exhibits no tenderness.       Baseline ROM, no obvious new focal weakness.  Neurological:       Mental status and motor strength appears baseline for patient and situation.  Skin: No rash noted.  Psychiatric: She has a normal mood and affect.    ED Course  Procedures (including critical care time) DIAGNOSTIC STUDIES: Oxygen Saturation is 99% on room air, normal by my interpretation.    COORDINATION OF CARE: 7:10 PM- Patient informed of clinical course, understands medical decision-making process, and agrees with plan. 7:27- Fluorescein test performed.       Labs Reviewed - No data to display No results found.   1. Conjunctivitis       MDM   I personally performed the services described in this documentation, which was scribed in my presence. The recorded information has been reviewed and is accurate. I doubt any other EMC precluding  discharge at this time including, but not necessarily limited to the following:cellulitis.      Hurman Horn, MD 10/09/12 [phone]

## 2012-10-05 NOTE — ED Notes (Signed)
Rt eye red and swollen and itches for 3 days.  The lt is ok

## 2012-10-05 NOTE — ED Notes (Signed)
Pt c/o continued right eye pain, redness and discharge; pt seen here for same recently

## 2013-06-27 ENCOUNTER — Encounter (HOSPITAL_COMMUNITY): Payer: Self-pay | Admitting: Emergency Medicine

## 2013-06-27 ENCOUNTER — Emergency Department (HOSPITAL_COMMUNITY)
Admission: EM | Admit: 2013-06-27 | Discharge: 2013-06-27 | Disposition: A | Discharge status: Home / Self Care | Payer: Medicaid Other | Attending: Emergency Medicine | Admitting: Emergency Medicine

## 2013-06-27 DIAGNOSIS — Z3202 Encounter for pregnancy test, result negative: Secondary | ICD-10-CM | POA: Insufficient documentation

## 2013-06-27 DIAGNOSIS — Z8659 Personal history of other mental and behavioral disorders: Secondary | ICD-10-CM | POA: Insufficient documentation

## 2013-06-27 DIAGNOSIS — R110 Nausea: Secondary | ICD-10-CM | POA: Insufficient documentation

## 2013-06-27 DIAGNOSIS — N938 Other specified abnormal uterine and vaginal bleeding: Secondary | ICD-10-CM | POA: Insufficient documentation

## 2013-06-27 DIAGNOSIS — R42 Dizziness and giddiness: Secondary | ICD-10-CM | POA: Insufficient documentation

## 2013-06-27 DIAGNOSIS — N920 Excessive and frequent menstruation with regular cycle: Secondary | ICD-10-CM

## 2013-06-27 DIAGNOSIS — Z21 Asymptomatic human immunodeficiency virus [HIV] infection status: Secondary | ICD-10-CM | POA: Insufficient documentation

## 2013-06-27 DIAGNOSIS — N390 Urinary tract infection, site not specified: Secondary | ICD-10-CM

## 2013-06-27 DIAGNOSIS — N949 Unspecified condition associated with female genital organs and menstrual cycle: Secondary | ICD-10-CM | POA: Insufficient documentation

## 2013-06-27 DIAGNOSIS — Z8619 Personal history of other infectious and parasitic diseases: Secondary | ICD-10-CM | POA: Insufficient documentation

## 2013-06-27 DIAGNOSIS — R102 Pelvic and perineal pain: Secondary | ICD-10-CM

## 2013-06-27 DIAGNOSIS — J45909 Unspecified asthma, uncomplicated: Secondary | ICD-10-CM | POA: Insufficient documentation

## 2013-06-27 DIAGNOSIS — F17200 Nicotine dependence, unspecified, uncomplicated: Secondary | ICD-10-CM | POA: Insufficient documentation

## 2013-06-27 LAB — CBC WITH DIFFERENTIAL/PLATELET
Eosinophils Relative: 1 % (ref 0–5)
HCT: 37.3 % (ref 36.0–46.0)
Hemoglobin: 13.3 g/dL (ref 12.0–15.0)
Lymphocytes Relative: 23 % (ref 12–46)
Lymphs Abs: 2 10*3/uL (ref 0.7–4.0)
MCV: 79.4 fL (ref 78.0–100.0)
Monocytes Absolute: 0.6 10*3/uL (ref 0.1–1.0)
RBC: 4.7 MIL/uL (ref 3.87–5.11)
WBC: 8.8 10*3/uL (ref 4.0–10.5)

## 2013-06-27 LAB — URINALYSIS, ROUTINE W REFLEX MICROSCOPIC
Glucose, UA: NEGATIVE mg/dL
Specific Gravity, Urine: 1.029 (ref 1.005–1.030)

## 2013-06-27 LAB — COMPREHENSIVE METABOLIC PANEL
ALT: 14 U/L (ref 0–35)
CO2: 23 mEq/L (ref 19–32)
Calcium: 9.4 mg/dL (ref 8.4–10.5)
GFR calc Af Amer: >90 mL/min (ref >90)
GFR calc non Af Amer: >90 mL/min (ref >90)
Glucose, Bld: 90 mg/dL (ref 70–99)
Sodium: 140 mEq/L (ref 135–145)

## 2013-06-27 LAB — URINE MICROSCOPIC-ADD ON

## 2013-06-27 LAB — WET PREP, GENITAL: Yeast Wet Prep HPF POC: NONE SEEN

## 2013-06-27 MED ORDER — ONDANSETRON 4 MG PO TBDP
8.0000 mg | ORAL_TABLET | Freq: Once | ORAL | Status: AC
  Administered 2013-06-27: 8 mg via ORAL
  Filled 2013-06-27: qty 2

## 2013-06-27 MED ORDER — OXYCODONE-ACETAMINOPHEN 5-325 MG PO TABS
1.0000 | ORAL_TABLET | Freq: Once | ORAL | Status: AC
  Administered 2013-06-27: 1 via ORAL
  Filled 2013-06-27: qty 1

## 2013-06-27 MED ORDER — AZITHROMYCIN 1 G PO PACK
1.0000 g | PACK | Freq: Once | ORAL | Status: AC
  Administered 2013-06-27: 1 g via ORAL
  Filled 2013-06-27: qty 1

## 2013-06-27 MED ORDER — KETOROLAC TROMETHAMINE 60 MG/2ML IM SOLN
30.0000 mg | Freq: Once | INTRAMUSCULAR | Status: AC
  Administered 2013-06-27: 30 mg via INTRAMUSCULAR
  Filled 2013-06-27: qty 2

## 2013-06-27 MED ORDER — CEFTRIAXONE SODIUM 250 MG IJ SOLR
250.0000 mg | Freq: Once | INTRAMUSCULAR | Status: AC
  Administered 2013-06-27: 250 mg via INTRAMUSCULAR
  Filled 2013-06-27: qty 250

## 2013-06-27 MED ORDER — CIPROFLOXACIN HCL 500 MG PO TABS: 500.0000 mg | ORAL_TABLET | Freq: Two times a day (BID) | ORAL | Status: DC

## 2013-06-27 NOTE — ED Provider Notes (Signed)
Penny Young S 8:00 PM patient discussed in sign out.  Patient presenting with recurrent please lower pelvic pains associated with her administration.  Unremarkable pelvic exam.  No concerns for any acute abdominal process.  Slight signs for UTI.  No signs for STD at this time clinically.  CMP pending.  Plan to discharge home with treatment of her UTI.    CMP unremarkable.  Patient instructed to use NSAIDs for menstrual cramps and pains.  Patient also instructed to follow up with OB/GYN provider.  Angus Seller, PA-C 06/27/13 2025

## 2013-06-27 NOTE — ED Provider Notes (Signed)
CSN: [id number]     Arrival date & time 06/27/13  1848 History     First MD Initiated Contact with Patient 06/27/13 1931     Chief Complaint  Patient presents with  . Pelvic Pain    left side   (Consider location/radiation/quality/duration/timing/severity/associated sxs/prior Treatment) HPI  Patient is a G2 P2 27 yo F PMHx significant for Bipolar 1 disorder, Schizophrenia, Hepatitis B, HIV, Asthma, Drug abuse, s/p tubal ligation presenting to the ED for recurrent left sided pelvic pain w/ associated nausea on the first day of her menstrual cycle every cycle since her tubal ligation. Patient states she came to the ED because she "does not like being checked out by her female gynecologist." Patient has not tried anything for her pain. No alleviating factors. She has gone through three menstrual pads in the last hour with clots. Patient states this pain feels like every previous menstrual cycle beginning in the past. Denies fevers, chills, urinary symptoms, or diarrhea.   Past Medical History  Diagnosis Date  . Bipolar 1 disorder   . Schizophrenia   . Hepatitis B   . HIV (human immunodeficiency virus infection)   . Asthma   . Drug abuse    Past Surgical History  Procedure Laterality Date  . Tympanostomy tube placement     Family History  Problem Relation Age of Onset  . Hypertension     History  Substance Use Topics  . Smoking status: Current Every Day Smoker    Last Attempt to Quit: 11/26/2007  . Smokeless tobacco: Not on file  . Alcohol Use: Yes     Comment: occ   OB History   Grav Para Term Preterm Abortions TAB SAB Ect Mult Living                 Review of Systems  Constitutional: Negative for fever and chills.  HENT: Negative.   Eyes: Negative.   Respiratory: Negative.   Cardiovascular: Negative.   Gastrointestinal: Negative.   Endocrine: Negative.   Genitourinary: Positive for vaginal bleeding, menstrual problem and pelvic pain. Negative for dysuria and vaginal  discharge.  Skin: Negative.   Neurological: Positive for light-headedness. Negative for syncope.    Allergies  Review of patient's allergies indicates no known allergies.  Home Medications   Current Outpatient Rx  Name  Route  Sig  Dispense  Refill  . ciprofloxacin (CIPRO) 500 MG tablet   Oral   Take 1 tablet (500 mg total) by mouth 2 (two) times daily.   6 tablet   0    BP 160/89  Pulse 89  Temp(Src) 98.3 F (36.8 C) (Oral)  Resp 19  Ht 5\' 7"  (1.702 m)  Wt 241 lb (109.317 kg)  BMI 37.74 kg/m2  SpO2 98%  LMP 06/27/2013 Physical Exam  Constitutional: She is oriented to person, place, and time. She appears well-developed and well-nourished. No distress.  HENT:  Head: Normocephalic and atraumatic.  Eyes: Conjunctivae are normal.  Neck: Neck supple.  Cardiovascular: Normal rate, regular rhythm, normal heart sounds and intact distal pulses.   Pulmonary/Chest: Effort normal and breath sounds normal.  Abdominal: Soft. She exhibits no distension. There is no tenderness. There is no rigidity, no rebound, no guarding and no CVA tenderness.  Musculoskeletal: Normal range of motion.  Neurological: She is alert and oriented to person, place, and time.  Skin: Skin is warm and dry. She is not diaphoretic.   Exam performed by Jeannetta Ellis,  exam chaperoned Date: 06/27/2013  Pelvic exam: normal external genitalia without evidence of trauma. VULVA: normal appearing vulva with no masses, tenderness or lesion. VAGINA: normal appearing vagina with normal color and discharge, no lesions. CERVIX: normal appearing cervix without lesions, cervical motion tenderness absent, cervical os closed with out purulent discharge; vaginal discharge - bloody, Wet prep and DNA probe for chlamydia and GC obtained.   ADNEXA: normal adnexa in size, nontender and no masses UTERUS: uterus is normal size, shape, consistency and nontender.   ED Course   Procedures (including critical care  time)  Labs Reviewed  WET PREP, GENITAL - Abnormal; Notable for the following:    WBC, Wet Prep HPF POC FEW (*)    All other components within normal limits  URINALYSIS, ROUTINE W REFLEX MICROSCOPIC - Abnormal; Notable for the following:    Color, Urine RED (*)    APPearance TURBID (*)    Hgb urine dipstick LARGE (*)    Bilirubin Urine SMALL (*)    Ketones, ur 15 (*)    Protein, ur 100 (*)    Leukocytes, UA SMALL (*)    All other components within normal limits  COMPREHENSIVE METABOLIC PANEL - Abnormal; Notable for the following:    Potassium 3.4 (*)    All other components within normal limits  URINE MICROSCOPIC-ADD ON - Abnormal; Notable for the following:    Bacteria, UA FEW (*)    All other components within normal limits  URINE CULTURE  GC/CHLAMYDIA PROBE AMP  CBC WITH DIFFERENTIAL  POCT PREGNANCY, URINE   No results found. 1. Menorrhagia   2. Pelvic pain   3. UTI (urinary tract infection)     MDM  Patient is nontoxic, nonseptic appearing, in no apparent distress.  Patient's pain and other symptoms adequately managed in emergency department.  Fluids tolerated in ED.  Labs, and vitals reviewed.  Patient does not meet the SIRS or Sepsis criteria.  On repeat exam patient does not have a surgical abdomen and there are nor peritoneal signs.  No indication of appendicitis, bowel obstruction, bowel perforation, cholecystitis, diverticulitis, PID or ectopic pregnancy.  Patient discharged home with symptomatic treatment and given strict instructions for follow-up with their primary care physician.  I have also discussed reasons to return immediately to the ER.  Patient expresses understanding and agrees with plan. Patient d/w with Dr. Radford Pax, agrees with plan. Patient is stable at time of discharge    Jeannetta Ellis, PA-C 06/28/13 [id number]

## 2013-06-27 NOTE — ED Notes (Signed)
Pt c/o left sided pelvic pain onset today with nausea and dizziness.

## 2013-06-27 NOTE — ED Provider Notes (Signed)
Medical screening examination/treatment/procedure(s) were performed by non-physician practitioner and as supervising physician I was immediately available for consultation/collaboration.   Gwyneth Sprout, MD 06/27/13 [phone]

## 2013-06-29 LAB — URINE CULTURE: Colony Count: 60000

## 2013-07-01 NOTE — ED Provider Notes (Signed)
Medical screening examination/treatment/procedure(s) were performed by non-physician practitioner and as supervising physician I was immediately available for consultation/collaboration.    Nelia Shi, MD 07/01/13 [phone]

## 2013-07-12 ENCOUNTER — Emergency Department (HOSPITAL_COMMUNITY)
Admission: EM | Admit: 2013-07-12 | Discharge: 2013-07-12 | Disposition: A | Discharge status: Home / Self Care | Payer: Medicaid Other | Attending: Emergency Medicine | Admitting: Emergency Medicine

## 2013-07-12 ENCOUNTER — Encounter (HOSPITAL_COMMUNITY): Payer: Self-pay | Admitting: Emergency Medicine

## 2013-07-12 DIAGNOSIS — J45909 Unspecified asthma, uncomplicated: Secondary | ICD-10-CM | POA: Insufficient documentation

## 2013-07-12 DIAGNOSIS — E669 Obesity, unspecified: Secondary | ICD-10-CM | POA: Insufficient documentation

## 2013-07-12 DIAGNOSIS — K0889 Other specified disorders of teeth and supporting structures: Secondary | ICD-10-CM

## 2013-07-12 DIAGNOSIS — K089 Disorder of teeth and supporting structures, unspecified: Secondary | ICD-10-CM | POA: Insufficient documentation

## 2013-07-12 DIAGNOSIS — F17200 Nicotine dependence, unspecified, uncomplicated: Secondary | ICD-10-CM | POA: Insufficient documentation

## 2013-07-12 DIAGNOSIS — Z8619 Personal history of other infectious and parasitic diseases: Secondary | ICD-10-CM | POA: Insufficient documentation

## 2013-07-12 DIAGNOSIS — Z8659 Personal history of other mental and behavioral disorders: Secondary | ICD-10-CM | POA: Insufficient documentation

## 2013-07-12 DIAGNOSIS — Z21 Asymptomatic human immunodeficiency virus [HIV] infection status: Secondary | ICD-10-CM | POA: Insufficient documentation

## 2013-07-12 HISTORY — DX: Obesity, unspecified: E66.9

## 2013-07-12 MED ORDER — HYDROCODONE-ACETAMINOPHEN 5-325 MG PO TABS: 2.0000 | ORAL_TABLET | Freq: Four times a day (QID) | ORAL | Status: DC | PRN

## 2013-07-12 MED ORDER — PENICILLIN V POTASSIUM 500 MG PO TABS: 500.0000 mg | ORAL_TABLET | Freq: Four times a day (QID) | ORAL | Status: AC

## 2013-07-12 NOTE — ED Notes (Signed)
PT. REPORTS PERSISTENT LEFT LOWER MOLAR PAIN WITH CAVITY FOR 3 DAYS UNRELIEVED BY OTC PAIN MEDICATIONS .

## 2013-07-12 NOTE — ED Provider Notes (Signed)
CSN: [id number]     Arrival date & time 07/12/13  2207 History  This chart was scribed for non-physician practitioner Magnus Sinning, PA-C, working with Joya Gaskins, MD by Shari Heritage, ED Scribe. This patient was seen in room TR05C/TR05C and the patient's care was started at 11:34 PM.    Chief Complaint  Patient presents with  . Dental Pain    The history is provided by the patient. No language interpreter was used.   HPI Comments: Jackalyn Obst is a 27 y.o. female who presents to the Emergency Department complaining of waxing and waning, left lower dental pain for the past 3 days. She reports she has used Tylenol for the pain without relief. She has a Education officer, community, but will not be able to see them until September. She denies fever, chills, facial swelling, trismus, difficulty swallowing, or dental injury. She has history of HIV, asthma, and Hepatitis B. She is a current every day smoker. She has no known allergies.    Past Medical History  Diagnosis Date  . Bipolar 1 disorder   . Schizophrenia   . Hepatitis B   . HIV (human immunodeficiency virus infection)   . Asthma   . Drug abuse   . Obese    Past Surgical History  Procedure Laterality Date  . Tympanostomy tube placement     Family History  Problem Relation Age of Onset  . Hypertension     History  Substance Use Topics  . Smoking status: Current Every Day Smoker    Last Attempt to Quit: 11/26/2007  . Smokeless tobacco: Not on file  . Alcohol Use: Yes     Comment: occ   OB History   Grav Para Term Preterm Abortions TAB SAB Ect Mult Living                 Review of Systems A complete 10 system review of systems was obtained and all systems are negative except as noted in the HPI and PMH.   Allergies  Review of patient's allergies indicates no known allergies.  Home Medications   Current Outpatient Rx  Name  Route  Sig  Dispense  Refill  . Acetaminophen (TYLENOL PO)   Oral   Take 3 tablets by mouth every  6 (six) hours as needed (pain).         . Aspirin-Salicylamide-Caffeine (BC HEADACHE POWDER PO)   Oral   Take 1 packet by mouth daily as needed (pain).          Triage Vitals: BP 170/117  Pulse 76  Temp(Src) 98.5 F (36.9 C) (Oral)  Resp 20  SpO2 100%  LMP 06/27/2013  Physical Exam  Nursing note and vitals reviewed. Constitutional: She is oriented to person, place, and time. She appears well-developed and well-nourished.  HENT:  Head: Normocephalic and atraumatic.  Mouth/Throat: Oropharynx is clear and moist and mucous membranes are normal. No trismus in the jaw.  Diffuse dental decay.  Tenderness to palpation of the left lower gingiva. No obvious abscess. No sublingual tenderness or tongue elevation.  Eyes: EOM are normal.  Neck: Normal range of motion.  Cardiovascular: Normal rate, regular rhythm and normal heart sounds.   Pulmonary/Chest: Effort normal and breath sounds normal.  Lymphadenopathy:       Head (right side): No submental and no submandibular adenopathy present.       Head (left side): No submental and no submandibular adenopathy present.  Neurological: She is alert and oriented to person, place,  and time.  Skin: Skin is warm and dry.  Psychiatric: She has a normal mood and affect.    ED Course  DIAGNOSTIC STUDIES: Oxygen Saturation is 100% on room air, normal by my interpretation.    COORDINATION OF CARE: 11:40 PM- Patient presents to the ED complaining of left lower dental pain. Will prescribe PCN and pain medication to treat symptoms. Advised to follow up with a dentist. Also advised to stay away from harder foods. Patient informed of current plan for treatment and evaluation and agrees with plan at this time.     Procedures (including critical care time)   1. Pain, dental     MDM  Patient with toothache.  No gross abscess.  Exam unconcerning for Ludwig's angina or spread of infection.  Will treat with penicillin and pain medicine.  Urged patient  to follow-up with dentist.    I personally performed the services described in this documentation, which was scribed in my presence. The recorded information has been reviewed and is accurate.    Pascal Lux Prescott, PA-C 07/12/13 [phone]

## 2013-07-14 NOTE — ED Provider Notes (Signed)
Medical screening examination/treatment/procedure(s) were performed by non-physician practitioner and as supervising physician I was immediately available for consultation/collaboration.   Joya Gaskins, MD 07/14/13 1345

## 2013-09-01 ENCOUNTER — Emergency Department (HOSPITAL_COMMUNITY)
Admission: EM | Admit: 2013-09-01 | Discharge: 2013-09-01 | Disposition: A | Discharge status: Home / Self Care | Payer: Medicaid Other | Attending: Emergency Medicine | Admitting: Emergency Medicine

## 2013-09-01 ENCOUNTER — Encounter (HOSPITAL_COMMUNITY): Payer: Self-pay | Admitting: Emergency Medicine

## 2013-09-01 DIAGNOSIS — E669 Obesity, unspecified: Secondary | ICD-10-CM | POA: Insufficient documentation

## 2013-09-01 DIAGNOSIS — K0889 Other specified disorders of teeth and supporting structures: Secondary | ICD-10-CM

## 2013-09-01 DIAGNOSIS — Z21 Asymptomatic human immunodeficiency virus [HIV] infection status: Secondary | ICD-10-CM | POA: Insufficient documentation

## 2013-09-01 DIAGNOSIS — J45909 Unspecified asthma, uncomplicated: Secondary | ICD-10-CM | POA: Insufficient documentation

## 2013-09-01 DIAGNOSIS — Z8619 Personal history of other infectious and parasitic diseases: Secondary | ICD-10-CM | POA: Insufficient documentation

## 2013-09-01 DIAGNOSIS — M273 Alveolitis of jaws: Secondary | ICD-10-CM | POA: Insufficient documentation

## 2013-09-01 DIAGNOSIS — B20 Human immunodeficiency virus [HIV] disease: Secondary | ICD-10-CM

## 2013-09-01 DIAGNOSIS — F17200 Nicotine dependence, unspecified, uncomplicated: Secondary | ICD-10-CM | POA: Insufficient documentation

## 2013-09-01 DIAGNOSIS — K089 Disorder of teeth and supporting structures, unspecified: Secondary | ICD-10-CM | POA: Insufficient documentation

## 2013-09-01 DIAGNOSIS — R220 Localized swelling, mass and lump, head: Secondary | ICD-10-CM | POA: Insufficient documentation

## 2013-09-01 DIAGNOSIS — Z8659 Personal history of other mental and behavioral disorders: Secondary | ICD-10-CM | POA: Insufficient documentation

## 2013-09-01 MED ORDER — PENICILLIN V POTASSIUM 500 MG PO TABS: 500.0000 mg | ORAL_TABLET | Freq: Four times a day (QID) | ORAL | Status: DC

## 2013-09-01 MED ORDER — ACETAMINOPHEN-CODEINE #3 300-30 MG PO TABS: 1.0000 | ORAL_TABLET | Freq: Four times a day (QID) | ORAL | Status: DC | PRN

## 2013-09-01 NOTE — ED Provider Notes (Signed)
CSN: [id number]     Arrival date & time 09/01/13  2003 History   First MD Initiated Contact with Patient 09/01/13 2201    This chart was scribed for Dahlia Client Loreda Silverio - PA  by Ladona Ridgel Day, ED scribe. This patient was seen in room TR07C/TR07C and the patient's care was started at 2003.  Chief Complaint  Patient presents with  . Dental Pain   Patient is a 27 y.o. female presenting with tooth pain. The history is provided by the patient. No language interpreter was used.  Dental Pain Location:  Lower Lower teeth location: lower bilateral wisdom teeth. Quality:  Aching Severity:  Mild Onset quality:  Gradual Duration:  3 days Timing:  Constant Progression:  Worsening Chronicity:  New Context: recent dental surgery   Prior workup: wisdom tooth extraction. Relieved by:  Nothing Worsened by:  Pressure Ineffective treatments:  Acetaminophen and NSAIDs Associated symptoms: facial swelling   Associated symptoms: no difficulty swallowing, no fever and no trismus    HPI Comments: Veona Busbee is a 27 y.o. female who presents to the Emergency Department complaining of bilateral lower dental pain, constant, gradually worsening for 3 days after she recently had her wisdom teeth removed 1 1/2 weeks ago. She states face feels swollen/warm, unsure of fever. She states only having liquids recently because eating solids causes her pain. She has been using norco for pain which she is out of and tylenol as well w/minimal relief. She is not currently taking antibiotics. She has an appointment to f/u w/her dentist on the 16th this month. She denies nausea, emesis. She states no previous oral surgeries. She has no allergies to medicines.   Past Medical History  Diagnosis Date  . Bipolar 1 disorder   . Schizophrenia   . Hepatitis B   . HIV (human immunodeficiency virus infection)   . Asthma   . Drug abuse   . Obese    Past Surgical History  Procedure Laterality Date  . Tympanostomy tube placement      Family History  Problem Relation Age of Onset  . Hypertension     History  Substance Use Topics  . Smoking status: Current Every Day Smoker    Last Attempt to Quit: 11/26/2007  . Smokeless tobacco: Not on file  . Alcohol Use: Yes     Comment: occ   OB History   Grav Para Term Preterm Abortions TAB SAB Ect Mult Living                 Review of Systems  Constitutional: Negative for fever and chills.  HENT: Positive for dental problem (bilateral lower pain from recent wisdom teeth extraction) and facial swelling.   Respiratory: Negative for shortness of breath.   Gastrointestinal: Negative for nausea and vomiting.  Musculoskeletal: Negative for back pain.  Neurological: Negative for weakness.  All other systems reviewed and are negative.   A complete 10 system review of systems was obtained and all systems are negative except as noted in the HPI and PMH.   Allergies  Norco  Home Medications   Current Outpatient Rx  Name  Route  Sig  Dispense  Refill  . Acetaminophen (TYLENOL PO)   Oral   Take 3 tablets by mouth every 6 (six) hours as needed (pain).         Marland Kitchen HYDROcodone-acetaminophen (NORCO/VICODIN) 5-325 MG per tablet   Oral   Take 2 tablets by mouth every 6 (six) hours as needed for pain.   20  tablet   0   . acetaminophen-codeine (TYLENOL #3) 300-30 MG per tablet   Oral   Take 1-2 tablets by mouth every 6 (six) hours as needed for pain.   9 tablet   0   . penicillin v potassium (VEETID) 500 MG tablet   Oral   Take 1 tablet (500 mg total) by mouth 4 (four) times daily.   40 tablet   0    Triage Vitals: BP 130/88  Pulse 78  Temp(Src) 98.8 F (37.1 C) (Oral)  Resp 18  SpO2 98%  LMP 08/24/2013 Physical Exam  Nursing note and vitals reviewed. Constitutional: She appears well-developed and well-nourished.  HENT:  Head: Normocephalic and atraumatic.  Right Ear: Tympanic membrane, external ear and ear canal normal.  Left Ear: Tympanic membrane,  external ear and ear canal normal.  Nose: Nose normal. Right sinus exhibits no maxillary sinus tenderness and no frontal sinus tenderness. Left sinus exhibits no maxillary sinus tenderness and no frontal sinus tenderness.  Mouth/Throat: Uvula is midline, oropharynx is clear and moist and mucous membranes are normal. No oral lesions. Normal dentition. No uvula swelling, lacerations or dental caries. No oropharyngeal exudate, posterior oropharyngeal edema, posterior oropharyngeal erythema or tonsillar abscesses.  Visible erythema and swelling at the site of the wisdom tooth extraction Also visible dry socket in the bottom two incisions No gross abscess No facial swelling  Eyes: Conjunctivae are normal. Pupils are equal, round, and reactive to light. Right eye exhibits no discharge. Left eye exhibits no discharge.  Neck: Normal range of motion. Neck supple.  Cardiovascular: Normal rate, regular rhythm, normal heart sounds and intact distal pulses.   No murmur heard. Pulmonary/Chest: Effort normal and breath sounds normal. No respiratory distress. She has no wheezes. She has no rales.  Abdominal: Soft. Bowel sounds are normal. She exhibits no distension. There is no tenderness.  Lymphadenopathy:    She has no cervical adenopathy.  Neurological: She is alert. She exhibits normal muscle tone. Coordination normal.  Skin: Skin is warm and dry. No rash noted. No erythema.  Psychiatric: She has a normal mood and affect.    ED Course  Procedures (including critical care time) DIAGNOSTIC STUDIES: Oxygen Saturation is 98% on room air, normal by my interpretation.    COORDINATION OF CARE: At 1015 PM Discussed treatment plan with patient which includes penicillin and pain control. Patient agrees.   Labs Review Labs Reviewed - No data to display Imaging Review No results found.  MDM   1. Dry tooth socket   2. Pain, dental   3. HIV DISEASE      Abbeygail Iiams presents with dental pain s/p  wisdom tooth extraction.  Pt with mild swelling and erythema at the site with likely dry socket.  No gross abscess.  Will d/c with penicillin abx and pain medication.  Pt has f/u with oral surgeon on Oct 16th.  Recommend additional ibuprofen as needed for pain.    Patient with toothache.  No gross abscess.  Exam unconcerning for Ludwig's angina or spread of infection.  Will treat with penicillin and pain medicine.  Urged patient to follow-up with dentist.    It has been determined that no acute conditions requiring further emergency intervention are present at this time. The patient/guardian have been advised of the diagnosis and plan. We have discussed signs and symptoms that warrant return to the ED, such as changes or worsening in symptoms.   Vital signs are stable at discharge.   BP 130/88  Pulse 78  Temp(Src) 98.8 F (37.1 C) (Oral)  Resp 18  SpO2 98%  LMP 08/24/2013  Patient/guardian has voiced understanding and agreed to follow-up with the PCP or specialist.    I personally performed the services described in this documentation, which was scribed in my presence. The recorded information has been reviewed and is accurate.       Dahlia Client Adream Parzych, PA-C 09/01/13 2245

## 2013-09-01 NOTE — ED Notes (Signed)
Pt. reports bilateral lower molar pain for 3 days , pt. stated she had her molars extracted 1 1/2 weeks ago.

## 2013-09-03 NOTE — ED Provider Notes (Signed)
Medical screening examination/treatment/procedure(s) were performed by non-physician practitioner and as supervising physician I was immediately available for consultation/collaboration.   Perris Tripathi B. Bernette Mayers, MD 09/03/13 2029

## 2013-09-28 ENCOUNTER — Emergency Department (HOSPITAL_COMMUNITY): Payer: Medicaid Other

## 2013-09-28 ENCOUNTER — Encounter (HOSPITAL_COMMUNITY): Payer: Self-pay | Admitting: Emergency Medicine

## 2013-09-28 ENCOUNTER — Emergency Department (HOSPITAL_COMMUNITY)
Admission: EM | Admit: 2013-09-28 | Discharge: 2013-09-28 | Disposition: A | Discharge status: Home / Self Care | Payer: Medicaid Other | Attending: Emergency Medicine | Admitting: Emergency Medicine

## 2013-09-28 DIAGNOSIS — Z8619 Personal history of other infectious and parasitic diseases: Secondary | ICD-10-CM | POA: Insufficient documentation

## 2013-09-28 DIAGNOSIS — S8990XA Unspecified injury of unspecified lower leg, initial encounter: Secondary | ICD-10-CM | POA: Insufficient documentation

## 2013-09-28 DIAGNOSIS — X500XXA Overexertion from strenuous movement or load, initial encounter: Secondary | ICD-10-CM | POA: Insufficient documentation

## 2013-09-28 DIAGNOSIS — S99911A Unspecified injury of right ankle, initial encounter: Secondary | ICD-10-CM

## 2013-09-28 DIAGNOSIS — Z21 Asymptomatic human immunodeficiency virus [HIV] infection status: Secondary | ICD-10-CM | POA: Insufficient documentation

## 2013-09-28 DIAGNOSIS — F17200 Nicotine dependence, unspecified, uncomplicated: Secondary | ICD-10-CM | POA: Insufficient documentation

## 2013-09-28 DIAGNOSIS — E669 Obesity, unspecified: Secondary | ICD-10-CM | POA: Insufficient documentation

## 2013-09-28 DIAGNOSIS — Z8659 Personal history of other mental and behavioral disorders: Secondary | ICD-10-CM | POA: Insufficient documentation

## 2013-09-28 DIAGNOSIS — Y9289 Other specified places as the place of occurrence of the external cause: Secondary | ICD-10-CM | POA: Insufficient documentation

## 2013-09-28 DIAGNOSIS — J45909 Unspecified asthma, uncomplicated: Secondary | ICD-10-CM | POA: Insufficient documentation

## 2013-09-28 DIAGNOSIS — Y9389 Activity, other specified: Secondary | ICD-10-CM | POA: Insufficient documentation

## 2013-09-28 MED ORDER — TRAMADOL HCL 50 MG PO TABS: 50.0000 mg | ORAL_TABLET | Freq: Four times a day (QID) | ORAL | Status: DC | PRN

## 2013-09-28 MED ORDER — TRAMADOL HCL 50 MG PO TABS
50.0000 mg | ORAL_TABLET | Freq: Once | ORAL | Status: AC
  Administered 2013-09-28: 50 mg via ORAL
  Filled 2013-09-28: qty 1

## 2013-09-28 NOTE — ED Provider Notes (Signed)
CSN: [id number]     Arrival date & time 09/28/13  0755 History   First MD Initiated Contact with Patient 09/28/13 [phone]     Chief Complaint  Patient presents with  . Foot Pain   (Consider location/radiation/quality/duration/timing/severity/associated sxs/prior Treatment) HPI Comments: Patient is a 27 year old female who presents with ankle pain that started last night. The mechanism of injury was sudden ankle inversion. Patient reports hearing a "pop" sudden onset of throbbing, severe pain that is localized to right ankle. Patient reports progressive worsening of pain. Ankle movement and weight bearing activity make the pain worse. Nothing makes the pain better. Patient reports associated swelling. Patient has not tried anything for pain relief. Patient denies obvious deformity, numbness/tingling, coolness/weakness of extremity, bruising, and any other injury.     Patient is a 27 y.o. female presenting with lower extremity pain.  Foot Pain Associated symptoms include arthralgias and joint swelling.    Past Medical History  Diagnosis Date  . Bipolar 1 disorder   . Schizophrenia   . Hepatitis B   . HIV (human immunodeficiency virus infection)   . Asthma   . Drug abuse   . Obese    Past Surgical History  Procedure Laterality Date  . Tympanostomy tube placement     Family History  Problem Relation Age of Onset  . Hypertension     History  Substance Use Topics  . Smoking status: Current Every Day Smoker    Last Attempt to Quit: 11/26/2007  . Smokeless tobacco: Not on file  . Alcohol Use: Yes     Comment: occ   OB History   Grav Para Term Preterm Abortions TAB SAB Ect Mult Living                 Review of Systems  Musculoskeletal: Positive for arthralgias and joint swelling.  All other systems reviewed and are negative.    Allergies  Aspirin and Norco  Home Medications  No current outpatient prescriptions on file. BP 150/96  Pulse 74  Temp(Src) 98.1 F (36.7 C)  (Oral)  Resp 18  Ht 5\' 7"  (1.702 m)  Wt 259 lb 4 oz (117.595 kg)  BMI 40.59 kg/m2  SpO2 100%  LMP 09/28/2013 Physical Exam  Nursing note and vitals reviewed. Constitutional: She is oriented to person, place, and time. She appears well-developed and well-nourished. No distress.  HENT:  Head: Normocephalic and atraumatic.  Eyes: Conjunctivae are normal.  Neck: Normal range of motion.  Cardiovascular: Normal rate and regular rhythm.  Exam reveals no gallop and no friction rub.   No murmur heard. Pulmonary/Chest: Effort normal and breath sounds normal. She has no wheezes. She has no rales. She exhibits no tenderness.  Abdominal: Soft. There is no tenderness.  Musculoskeletal: Normal range of motion.  Right ankle ROM limited due to pain. Generalized tenderness and edema of right ankle. No obvious deformity.   Neurological: She is alert and oriented to person, place, and time. Coordination normal.  Speech is goal-oriented. Moves limbs without ataxia.   Skin: Skin is warm and dry.  Psychiatric: She has a normal mood and affect. Her behavior is normal.    ED Course  Procedures (including critical care time) Labs Review Labs Reviewed - No data to display Imaging Review Dg Ankle Complete Right  09/28/2013   CLINICAL DATA:  history of injury and pain from a fall. Pain and swelling involving lateral aspect.  EXAM: RIGHT ANKLE - COMPLETE 3+ VIEW  COMPARISON:  None.  FINDINGS: Alignment is normal. Joint spaces are preserved. No fracture or dislocation is evident. No soft tissue lesions are seen. There is a small spur arising from the tip of the medial malleolus. There is posterior calcaneal spurring. There is spurring of the dorsal aspect of navicula. Small corticated bony density adjacent to the superior distal aspect of talus on lateral image most likely reflects small accessory ossicle rather than previous trauma.  IMPRESSION: No evidence of acute fracture. No dislocation. Chronic findings are  described above.   Electronically Signed   By: Onalee Hua  Call M.D.   On: 09/28/2013 08:32    EKG Interpretation   None       MDM   1. Right ankle injury, initial encounter     9:08 AM Xray shows no acute changes. Patient will have ASO splint and crutches and pain medication. No neurovascular compromise. Vitals stable and patient afebrile.     Emilia Beck, [place] 09/28/13 [phone]

## 2013-09-28 NOTE — ED Notes (Signed)
Pt reporting last night lost her step and stepped into a hole in the backyard, twisting her right ankle.

## 2013-09-28 NOTE — ED Notes (Signed)
Family at bedside. 

## 2013-09-28 NOTE — ED Notes (Signed)
Patient transported to X-ray 

## 2013-09-28 NOTE — ED Provider Notes (Signed)
Medical screening examination/treatment/procedure(s) were performed by non-physician practitioner and as supervising physician I was immediately available for consultation/collaboration.  EKG Interpretation   None         Enid Skeens, MD 09/28/13 1726

## 2013-12-29 ENCOUNTER — Emergency Department (HOSPITAL_COMMUNITY): Payer: Self-pay

## 2013-12-29 ENCOUNTER — Other Ambulatory Visit: Payer: Self-pay

## 2013-12-29 ENCOUNTER — Emergency Department (HOSPITAL_COMMUNITY)
Admission: EM | Admit: 2013-12-29 | Discharge: 2013-12-29 | Disposition: A | Discharge status: Home / Self Care | Payer: Self-pay | Attending: Emergency Medicine | Admitting: Emergency Medicine

## 2013-12-29 ENCOUNTER — Encounter (HOSPITAL_COMMUNITY): Payer: Self-pay | Admitting: Emergency Medicine

## 2013-12-29 DIAGNOSIS — R590 Localized enlarged lymph nodes: Secondary | ICD-10-CM

## 2013-12-29 DIAGNOSIS — J45909 Unspecified asthma, uncomplicated: Secondary | ICD-10-CM | POA: Insufficient documentation

## 2013-12-29 DIAGNOSIS — Z8619 Personal history of other infectious and parasitic diseases: Secondary | ICD-10-CM | POA: Insufficient documentation

## 2013-12-29 DIAGNOSIS — Z3202 Encounter for pregnancy test, result negative: Secondary | ICD-10-CM | POA: Insufficient documentation

## 2013-12-29 DIAGNOSIS — Z21 Asymptomatic human immunodeficiency virus [HIV] infection status: Secondary | ICD-10-CM | POA: Insufficient documentation

## 2013-12-29 DIAGNOSIS — E669 Obesity, unspecified: Secondary | ICD-10-CM | POA: Insufficient documentation

## 2013-12-29 DIAGNOSIS — Z8659 Personal history of other mental and behavioral disorders: Secondary | ICD-10-CM | POA: Insufficient documentation

## 2013-12-29 DIAGNOSIS — R599 Enlarged lymph nodes, unspecified: Secondary | ICD-10-CM | POA: Insufficient documentation

## 2013-12-29 DIAGNOSIS — F17200 Nicotine dependence, unspecified, uncomplicated: Secondary | ICD-10-CM | POA: Insufficient documentation

## 2013-12-29 DIAGNOSIS — R0789 Other chest pain: Secondary | ICD-10-CM | POA: Insufficient documentation

## 2013-12-29 DIAGNOSIS — J329 Chronic sinusitis, unspecified: Secondary | ICD-10-CM | POA: Insufficient documentation

## 2013-12-29 LAB — CBC WITH DIFFERENTIAL/PLATELET
Basophils Absolute: 0 10*3/uL (ref 0.0–0.1)
Basophils Relative: 0 % (ref 0–1)
EOS PCT: 1 % (ref 0–5)
Eosinophils Absolute: 0.1 10*3/uL (ref 0.0–0.7)
HEMATOCRIT: 39.3 % (ref 36.0–46.0)
Hemoglobin: 13.6 g/dL (ref 12.0–15.0)
LYMPHS ABS: 3 10*3/uL (ref 0.7–4.0)
Lymphocytes Relative: 61 % — ABNORMAL HIGH (ref 12–46)
MCH: 28.2 pg (ref 26.0–34.0)
MCHC: 34.6 g/dL (ref 30.0–36.0)
MCV: 81.5 fL (ref 78.0–100.0)
MONO ABS: 0.3 10*3/uL (ref 0.1–1.0)
Monocytes Relative: 6 % (ref 3–12)
Neutro Abs: 1.6 10*3/uL — ABNORMAL LOW (ref 1.7–7.7)
Neutrophils Relative %: 32 % — ABNORMAL LOW (ref 43–77)
Platelets: 292 10*3/uL (ref 150–400)
RBC: 4.82 MIL/uL (ref 3.87–5.11)
RDW: 14.3 % (ref 11.5–15.5)
WBC: 5 10*3/uL (ref 4.0–10.5)

## 2013-12-29 LAB — COMPREHENSIVE METABOLIC PANEL
ALT: 16 U/L (ref 0–35)
AST: 20 U/L (ref 0–37)
Albumin: 3.4 g/dL — ABNORMAL LOW (ref 3.5–5.2)
Alkaline Phosphatase: 91 U/L (ref 39–117)
BILIRUBIN TOTAL: 0.3 mg/dL (ref 0.3–1.2)
BUN: 8 mg/dL (ref 6–23)
CALCIUM: 9.1 mg/dL (ref 8.4–10.5)
CO2: 24 meq/L (ref 19–32)
CREATININE: 0.66 mg/dL (ref 0.50–1.10)
Chloride: 103 mEq/L (ref 96–112)
GLUCOSE: 80 mg/dL (ref 70–99)
Potassium: 4 mEq/L (ref 3.7–5.3)
Sodium: 140 mEq/L (ref 137–147)
Total Protein: 8.4 g/dL — ABNORMAL HIGH (ref 6.0–8.3)

## 2013-12-29 LAB — URINALYSIS, ROUTINE W REFLEX MICROSCOPIC
Bilirubin Urine: NEGATIVE
Glucose, UA: NEGATIVE mg/dL
HGB URINE DIPSTICK: NEGATIVE
Ketones, ur: NEGATIVE mg/dL
NITRITE: NEGATIVE
PROTEIN: NEGATIVE mg/dL
Specific Gravity, Urine: 1.021 (ref 1.005–1.030)
Urobilinogen, UA: 0.2 mg/dL (ref 0.0–1.0)
pH: 7 (ref 5.0–8.0)

## 2013-12-29 LAB — URINE MICROSCOPIC-ADD ON

## 2013-12-29 LAB — POCT PREGNANCY, URINE: Preg Test, Ur: NEGATIVE

## 2013-12-29 LAB — GLUCOSE, CAPILLARY: GLUCOSE-CAPILLARY: 82 mg/dL (ref 70–99)

## 2013-12-29 LAB — POCT I-STAT TROPONIN I: Troponin i, poc: 0 ng/mL (ref 0.00–0.08)

## 2013-12-29 MED ORDER — IBUPROFEN 400 MG PO TABS
400.0000 mg | ORAL_TABLET | Freq: Once | ORAL | Status: AC
  Administered 2013-12-29: 400 mg via ORAL
  Filled 2013-12-29: qty 1

## 2013-12-29 MED ORDER — ACETAMINOPHEN 500 MG PO TABS
1000.0000 mg | ORAL_TABLET | Freq: Once | ORAL | Status: AC
  Administered 2013-12-29: 1000 mg via ORAL
  Filled 2013-12-29: qty 2

## 2013-12-29 MED ORDER — AMOXICILLIN-POT CLAVULANATE 500-125 MG PO TABS: 1.0000 | ORAL_TABLET | Freq: Three times a day (TID) | ORAL | Status: DC

## 2013-12-29 NOTE — ED Notes (Addendum)
Pt reports knots to back of neck x 1 week. Reports bad headaches for past week. Denies headache now. States woke up this morning with sweaty hands. Also states she's been having chest pain for 2-3 days. Aching feeling to left side of chest.

## 2013-12-29 NOTE — ED Notes (Signed)
Pt in gown, on monitor, bp cuff and pulse ox, family at bedside

## 2013-12-29 NOTE — Discharge Instructions (Signed)
°Emergency Department Resource Guide °1) Find a Doctor and Pay Out of Pocket °Although you won't have to find out who is covered by your insurance plan, it is a good idea to ask around and get recommendations. You will then need to call the office and see if the doctor you have chosen will accept you as a new patient and what types of options they offer for patients who are self-pay. Some doctors offer discounts or will set up payment plans for their patients who do not have insurance, but you will need to ask so you aren't surprised when you get to your appointment. ° °2) Contact Your Local Health Department °Not all health departments have doctors that can see patients for sick visits, but many do, so it is worth a call to see if yours does. If you don't know where your local health department is, you can check in your phone book. The CDC also has a tool to help you locate your state's health department, and many state websites also have listings of all of their local health departments. ° °3) Find a Walk-in Clinic °If your illness is not likely to be very severe or complicated, you may want to try a walk in clinic. These are popping up all over the country in pharmacies, drugstores, and shopping centers. They're usually staffed by nurse practitioners or physician assistants that have been trained to treat common illnesses and complaints. They're usually fairly quick and inexpensive. However, if you have serious medical issues or chronic medical problems, these are probably not your best option. ° °No Primary Care Doctor: °- Call Health Connect at  832-8000 - they can help you locate a primary care doctor that  accepts your insurance, provides certain services, etc. °- Physician Referral Service- 1-800-533-3463 ° °Chronic Pain Problems: °Organization         Address  Phone   Notes  °[facility]  (336) 297-2271 Patients need to be referred by their primary care doctor.  ° °Medication  Assistance: °Organization         Address  Phone   Notes  °[place] [facility] [address] °[place], [place] [address] (336) 641-8030 --Must be a resident of [place] °-- Must have NO insurance coverage whatsoever (no Medicaid/ Medicare, etc.) °-- The pt. MUST have a primary care doctor that directs their care regularly and follows them in the community °  °[facility]  (866) 331-1348   °[employer]  (888) 892-1162   ° °Agencies that provide inexpensive medical care: °Organization         Address  Phone   Notes  °[facility]  (336) 832-8035   °[facility]    (336) 832-7272   °[facility] [address] °[place], [place] [address] (336) 832-4777   °[facility] [address], °[place] (336) 271-4999   °[facility]    (336) 373-0678   °[facility]    (336) 272-1050   °[facility] ° [address], [place] Phone:  (336) 832-4444, Fax:  (336) 832-4440 Hours of Operation:  9 am - 6 pm, M-F.  Also accepts Medicaid/Medicare and self-pay.  °[facility] ° [address], [place] Phone: (336) 832-3150, Fax: (336) 832-3151. Hours of Operation:  8:30 am - 5:30 pm, M-F.  Also accepts Medicaid and self-pay.  °[facility] [place] [address], [place] Phone: (336) 878-6027   °[facility] [address], [place], [place] (336)723-1848, Ext. 123 Mondays & Thursdays: 7-9 AM.  First 15 patients are seen on a first come, first serve basis. °  ° °Medicaid-accepting [place] Providers: ° °Organization         Address  Phone   Notes  °[facility] [address], [place] (336) 641-2100 Also accepts self-pay patients.  °[facility] [address], [place] ° (336) 856-9996   °[facility] [address], [place]  (336) 288-8857   °[facility] [address] [place] [address], [place] (336) 299-7000   °Veita Bland [address], [place]  ° (336) 373-1557 Only accepts [place] Access Medicaid patients after they have their name applied to their card.  ° °Self-Pay (no insurance) in [place]: ° °Organization         Address  Phone   Notes  °Sickle Cell Patients, [facility] [address], [place] (336) 832-1970   °[facility] [address], [place] (336) 832-4400   °[facility] ° [address], [place] (336) 992-4800   °[facility]/Dr. Osei-Bonsu ° [address] [place] [address], [place] or [address], [place] (336) 841-8500 Phone number for both [place] and [place] locations is the same.  °[facility] [address], [place] (336) 299-0000   °[facility] [address] [place] [address], [place] or [address] (336) 852-7530 °(336) 878-2260   °[facility] [address], [place] (336) 350-1642, phone; (336) 294-5005, fax Sees patients 1st and 3rd Saturday of every month.  Must not qualify for public or private insurance (i.e. Medicaid, Medicare, [place] Health Choice, Veterans' Benefits) • Household income should be no more than 200% of the poverty level •The clinic cannot treat you if you are pregnant or think you are pregnant • Sexually transmitted diseases are not treated at the clinic.  ° ° °Dental Care: °Organization         Address  Phone  Notes  °[place] [facility] [address], [place] (336) 641-6152 Accepts children up to age 21 who are enrolled in Medicaid or [place] Health Choice; pregnant women with a Medicaid card; and children who have applied for Medicaid or [place] Health Choice, but were declined, whose parents can pay a reduced fee at time of service.  °[place]  [facility] [place]  [address], [place] (336) 641-7733 Accepts children up to age 21 who are enrolled in Medicaid or [place] Health Choice; pregnant women with a Medicaid card; and children who have applied for Medicaid or [place] Health Choice, but were declined, whose parents can pay a reduced fee at time of service.  °[facility] ° [address], [place] (336) 641-4533 Patients are seen by appointment only. Walk-ins are not accepted. [facility] will see patients 18 years of age and older. °Monday - Tuesday (8am-5pm) °Most Wednesdays (8:30-5pm) °$30 per visit, cash only  °[facility] ° [address], [place] (336) 641-4533 Patients are seen by appointment only. Walk-ins are not accepted. [facility] will see patients 18 years of age and older. °One   Wednesday Evening (Monthly: Volunteer Based).  $30 per visit, cash only  °[facility]  (919) 537-3737 for adults; Children under age 4, call Graduate Pediatric Dentistry at (919) [phone]. Children aged 4-14, please call (919) [phone] to request a pediatric application. ° Dental services are provided in all areas of dental care including fillings, crowns and bridges, complete and partial dentures, implants, gum treatment, root canals, and extractions. Preventive care is also provided. Treatment is provided to both adults and children. °Patients are selected via a lottery and there is often a waiting list. °  °[facility] [address], °[place] ° (336) 763-8833 [email or web address] °  °[facility] [address], [place], [place] (336)723-1848, Ext. 123 Second and Fourth Thursday of each month, opens at 6:30 AM; Clinic ends at 9 AM.  Patients are seen on a first-come first-served basis, and a limited number are seen during each clinic.  ° °[facility] ° [address], [place], [place] (336) 723-7904    Eligibility Requirements °You must have lived in [place], [place], or [place] counties for at least the last three months. °  You cannot be eligible for state or federal sponsored healthcare insurance, including Veterans Administration, Medicaid, or Medicare. °  You generally cannot be eligible for healthcare insurance through your employer.  °  How to apply: °Eligibility screenings are held every Tuesday and Wednesday afternoon from 1:00 pm until 4:00 pm. You do not need an appointment for the interview!  °[facility] [address], [place], [place] 336-631-2330   °[facility]  336-342-8273   °[facility]  336-703-3100   °[facility]  336-570-6415   ° °Behavioral Health Resources in the Community: °Intensive Outpatient Programs °Organization         Address  Phone  Notes  °[facility] [address], [place], [place] 336-878-6098   °[facility] [address], [place], [place] 336-832-9800   °[facility] [address], [place], [place] ° 336-882-2125   °[facility] [address],  °[place], [place] 1-800-853-5163 or 336-641-4981   °Substance Abuse Resources °Organization         Address  Phone  Notes  °[facility]  336-882-2125   °[facility]  336-784-9470   °[facility]  336-285-9073   °[facility]  336-845-3988   °Residential & Outpatient Substance Abuse Program  1-800-659-3381   °Psychological Services °Organization         Address  Phone  Notes  °[facility]  336- 832-9600   °[facility]  336- 378-7881   °[facility] [address], [place] 1-800-853-5163 or 336-641-4981   ° °Mobile Crisis Teams °Organization         Address  Phone  Notes  °[facility], Mobile Crisis Care Unit  1-877-626-1772   °Assertive °[facility] ° [address]  [place], [place] 336-834-9664   °Sharon DeEsch [address] °[place] 336-554-5454   ° °Self-Help/Support Groups °Organization         Address  Phone             Notes  °[facility]  - variety of support groups  336- 373-1402 Call for more information  °Narcotics Anonymous (NA), [facility] [address], °[place]  2 meetings at this location  ° °  Residential Treatment Programs Organization         Address  Phone  Notes  [facility] [address],    [place]  6-384-536-4680   [facility]  [address], [place] [address], [place], [place] 825-003-7048   [facility] [address] [place], [place] 889-169-4503 Admissions: 8am-3pm M-F  [facility] [address]    [place], [place] 888-280-0349   [facility] [address] [place], [place], [place] 179-150-5697   [facility] [address],  [place], [place] 948-016-5537   [facility] - Intensive Outpatient [address], [place], [place] 482-707-8675   [facility] ([facility] [address] [place].,  [place], [place] 4-492-010-0712 or 831-216-6661   [facility] ([facility] [address], [place], [place] 982-641-5830 Accepts Medicaid  [facility] [place] [address],  [place] 9-407-680-8811 Substance Abuse/Addiction Treatment   [facility]         Address  Phone  Notes  [facility]  641-407-1309   Angie Fava, PhD [address] [place], [place]   684-802-8379 or 585-153-4645   [facility]   [address] [place], [place] 6027761569   [facility] [address] [place], [place] [phone] Insurance/Medicaid/sponsorship through [facility] [address]                                    [place], [place] (313) 640-1201 Therapy/tele-psych/case    [facility] [address] [place], [place] 210-395-6668    Dr. Lolly Mustache  (405)660-4250   [facility]  [employer] [facility] 1) [address], [place] 2) [address] [place] 3)  [address], [place] 432-299-7167 678-161-7162  8633753958   [facility] Child Abuse Hotline (313)525-3977 or (262) 644-9398 (After Hours)       Take the prescription as directed. Take over the counter tylenol and ibuprofen, as directed on packaging, as needed for discomfort. Apply moist heat or ice to the area(s) of discomfort, for 15 minutes at a time, several times per day for the next few days.  Do not fall asleep on a heating or ice pack. Take over the counter decongestant (such as sudafed), as directed on packaging, for the next week.  Use over the counter normal saline nasal spray, as instructed in the Emergency Department, several times per day for the next 2 weeks.  Call your regular medical doctor tomorrow to schedule a follow up appointment in the next 2 days.  Return to the Emergency Department immediately if worsening.

## 2013-12-29 NOTE — ED Notes (Signed)
Pt given ONE cup of water per [facility] RN

## 2013-12-29 NOTE — ED Provider Notes (Signed)
CSN: [id number]     Arrival date & time 12/29/13  1257 History   First MD Initiated Contact with Patient 12/29/13 1522     Chief Complaint  Patient presents with  . Neck Pain    HPI Pt was seen at 1545. Per pt, c/o gradual onset and persistence of constant multiple symptoms for the past 1 week. Pt's symptoms include: sweaty hands in the morning, posterior neck "knots" that "are making my neck and head hurt," and constant generalized chest "pain." Describes the CP as "aching." Denies palpitations, no SOB/cough, no abd pain, no N/V/D, no back pain, no fevers, no rash, no visual changes, no focal motor weakness, no tingling/numbness in extremities.    Past Medical History  Diagnosis Date  . Bipolar 1 disorder   . Schizophrenia   . Hepatitis B   . HIV (human immunodeficiency virus infection)   . Asthma   . Drug abuse   . Obese    Past Surgical History  Procedure Laterality Date  . Tympanostomy tube placement     Family History  Problem Relation Age of Onset  . Hypertension     History  Substance Use Topics  . Smoking status: Current Every Day Smoker    Last Attempt to Quit: 11/26/2007  . Smokeless tobacco: Not on file  . Alcohol Use: Yes     Comment: occ    Review of Systems ROS: Statement: All systems negative except as marked or noted in the HPI; Constitutional: Negative for fever and chills. ; ; Eyes: Negative for eye pain, redness and discharge. ; ; ENMT: Negative for ear pain, hoarseness, nasal congestion, sinus pressure and sore throat. ; ; Cardiovascular: +CP. Negative for palpitations, diaphoresis, dyspnea and peripheral edema. ; ; Respiratory: Negative for cough, wheezing and stridor. ; ; Gastrointestinal: Negative for nausea, vomiting, diarrhea, abdominal pain, blood in stool, hematemesis, jaundice and rectal bleeding. ; ; Genitourinary: Negative for dysuria, flank pain and hematuria. ; ; Musculoskeletal: +neck pain. Negative for back pain. Negative for swelling and  trauma.; ; Skin: +"sweaty hands." Negative for pruritus, rash, abrasions, blisters, bruising and skin lesion.; ; Neuro: Negative for lightheadedness and neck stiffness. Negative for weakness, altered level of consciousness , altered mental status, extremity weakness, paresthesias, involuntary movement, seizure and syncope.      Allergies  Aspirin and Norco  Home Medications   Current Outpatient Rx  Name  Route  Sig  Dispense  Refill  . acetaminophen (TYLENOL) 500 MG tablet   Oral   Take 1,500 mg by mouth every 6 (six) hours as needed for headache.          BP 127/81  Pulse 79  Temp(Src) 98 F (36.7 C) (Oral)  Resp 24  SpO2 96%  LMP 12/20/2013 Physical Exam 1550: Physical examination:  Nursing notes reviewed; Vital signs and O2 SAT reviewed;  Constitutional: Well developed, Well nourished, Well hydrated, In no acute distress; Head:  Normocephalic, atraumatic; Eyes: EOMI, PERRL, No scleral icterus; ENMT: TM's clear bilat. +edemetous nasal turbinates bilat with clear rhinorrhea. Mouth and pharynx without lesions. No tonsillar exudates. No intra-oral edema. No submandibular or sublingual edema. No hoarse voice, no drooling, no stridor. No pain with manipulation of larynx.  Mouth and pharynx normal, Mucous membranes moist; Neck: Supple, Full range of motion, +right proximal posterior cervical chain lymphadenopathy without tenderness or overlying erythema. No meningeal signs.; Cardiovascular: Regular rate and rhythm, No murmur, rub, or gallop; Respiratory: Breath sounds clear & equal bilaterally, No rales, rhonchi, wheezes.  Speaking full sentences with ease, Normal respiratory effort/excursion; Chest: Nontender, Movement normal; Abdomen: Soft, Nontender, Nondistended, Normal bowel sounds; Genitourinary: No CVA tenderness; Spine:  No midline CS, TS, LS tenderness. +TTP bilat hypertonic cervical paraspinal and trapezius muscles.;; Extremities: Pulses normal, No tenderness, No edema, No calf edema  or asymmetry.; Neuro: AA&Ox3, Major CN grossly intact. No facial droop. Speech clear. No gross focal motor or sensory deficits in extremities. Climbs on and off stretcher easily by herself. Gait steady.; Skin: Color normal, Warm, Dry.   ED Course  Procedures     EKG Interpretation    Date/Time:  Wednesday December 29 2013 15:50:49 EST Ventricular Rate:  74 PR Interval:  160 QRS Duration: 86 QT Interval:  376 QTC Calculation: 417 R Axis:   66 Text Interpretation:  Sinus rhythm Baseline wander Otherwise normal ECG When compared with ECG of 08/28/2009 No significant change was found Confirmed by [facility]  MD, Nicholos Johns [phone]) on 12/29/2013 4:56:24 PM            MDM  MDM Reviewed: previous chart, nursing note and vitals Reviewed previous: labs and ECG Interpretation: labs, ECG, x-ray and CT scan     EPIC chart reviewed: 06/2009 CD4 count 1580.    Results for orders placed during the hospital encounter of 12/29/13  CBC WITH DIFFERENTIAL      Result Value Range   WBC 5.0  4.0 - 10.5 K/uL   RBC 4.82  3.87 - 5.11 MIL/uL   Hemoglobin 13.6  12.0 - 15.0 g/dL   HCT 11.9  14.7 - 82.9 %   MCV 81.5  78.0 - 100.0 fL   MCH 28.2  26.0 - 34.0 pg   MCHC 34.6  30.0 - 36.0 g/dL   RDW 56.2  13.0 - 86.5 %   Platelets 292  150 - 400 K/uL   Neutrophils Relative % 32 (*) 43 - 77 %   Neutro Abs 1.6 (*) 1.7 - 7.7 K/uL   Lymphocytes Relative 61 (*) 12 - 46 %   Lymphs Abs 3.0  0.7 - 4.0 K/uL   Monocytes Relative 6  3 - 12 %   Monocytes Absolute 0.3  0.1 - 1.0 K/uL   Eosinophils Relative 1  0 - 5 %   Eosinophils Absolute 0.1  0.0 - 0.7 K/uL   Basophils Relative 0  0 - 1 %   Basophils Absolute 0.0  0.0 - 0.1 K/uL  COMPREHENSIVE METABOLIC PANEL      Result Value Range   Sodium 140  137 - 147 mEq/L   Potassium 4.0  3.7 - 5.3 mEq/L   Chloride 103  96 - 112 mEq/L   CO2 24  19 - 32 mEq/L   Glucose, Bld 80  70 - 99 mg/dL   BUN 8  6 - 23 mg/dL   Creatinine, Ser 7.84  0.50 - 1.10 mg/dL    Calcium 9.1  8.4 - 69.6 mg/dL   Total Protein 8.4 (*) 6.0 - 8.3 g/dL   Albumin 3.4 (*) 3.5 - 5.2 g/dL   AST 20  0 - 37 U/L   ALT 16  0 - 35 U/L   Alkaline Phosphatase 91  39 - 117 U/L   Total Bilirubin 0.3  0.3 - 1.2 mg/dL   GFR calc non Af Amer >90  >90 mL/min   GFR calc Af Amer >90  >90 mL/min  URINALYSIS, ROUTINE W REFLEX MICROSCOPIC      Result Value Range   Color, Urine YELLOW  YELLOW   APPearance CLEAR  CLEAR   Specific Gravity, Urine 1.021  1.005 - 1.030   pH 7.0  5.0 - 8.0   Glucose, UA NEGATIVE  NEGATIVE mg/dL   Hgb urine dipstick NEGATIVE  NEGATIVE   Bilirubin Urine NEGATIVE  NEGATIVE   Ketones, ur NEGATIVE  NEGATIVE mg/dL   Protein, ur NEGATIVE  NEGATIVE mg/dL   Urobilinogen, UA 0.2  0.0 - 1.0 mg/dL   Nitrite NEGATIVE  NEGATIVE   Leukocytes, UA TRACE (*) NEGATIVE  GLUCOSE, CAPILLARY      Result Value Range   Glucose-Capillary 82  70 - 99 mg/dL  URINE MICROSCOPIC-ADD ON      Result Value Range   Squamous Epithelial / LPF FEW (*) RARE   WBC, UA 0-2  <3 WBC/hpf   Bacteria, UA FEW (*) RARE  POCT I-STAT TROPONIN I      Result Value Range   Troponin i, poc 0.00  0.00 - 0.08 ng/mL   Comment 3           POCT PREGNANCY, URINE      Result Value Range   Preg Test, Ur NEGATIVE  NEGATIVE   Dg Chest 2 View 12/29/2013   CLINICAL DATA:  Chest pain, difficulty breathing  EXAM: CHEST  2 VIEW  COMPARISON:  December 03, 2009  FINDINGS: The heart size and mediastinal contours are within normal limits. Both lungs are clear. The visualized skeletal structures are unremarkable.  IMPRESSION: No active cardiopulmonary disease.   Electronically Signed   By: Sherian Rein M.D.   On: 12/29/2013 13:47   Ct Cervical Spine Wo Contrast 12/29/2013   CLINICAL DATA:  Headaches and neck  EXAM: CT HEAD WITHOUT CONTRAST  CT CERVICAL SPINE WITHOUT CONTRAST  TECHNIQUE: Multidetector CT imaging of the head and cervical spine was performed following the standard protocol without intravenous contrast.  Multiplanar CT image reconstructions of the cervical spine were also generated.  COMPARISON:  None.  FINDINGS: CT HEAD FINDINGS  The ventricles are normal in size and configuration. There is a cavum septum pellucidum, an anatomic variant. There is invagination of CSF into the sellar region. There is no mass, hemorrhage, extra-axial fluid collection, or midline shift. Gray-white compartments are normal. There is no demonstrable acute infarct.  Bony calvarium appears intact. The mastoid air cells are clear. There are are retention cysts in the were right sphenoid and posterior left ethmoid air cell complex regions.  CT CERVICAL SPINE FINDINGS  There is no fracture or spondylolisthesis. Prevertebral soft tissues and predental space regions are normal.  Disc spaces appear intact. There is no disc extrusion or stenosis. There is no nerve root edema or effacement appreciable on this study. No paraspinous lesions are appreciated. Lung apices are clear. Thyroid appears within normal limits.  IMPRESSION: CT head: Evidence of a degree of empty sella. Paranasal sinus disease. Study otherwise unremarkable.  CT cervical spine: No fracture or spondylolisthesis. No disc extrusion or stenosis. No nerve root edema or effacement.   Electronically Signed   By: Bretta Bang M.D.   On: 12/29/2013 16:54    1800:   Doubt PE as cause for symptoms with low risk Wells.  Doubt ACS as cause for symptoms with normal troponin and unchanged EKG from previous after 2 days of constant symptoms.  Pt is not orthostatic. VS remain stable, resps easy, neuro exam intact and unchanged. Has tol PO well without N/V. Has been ambulatory in the ED with steady gait. Pt states the "bumps"  in the back of her neck are "causing everything to hurt." Spoke with [facility] MD Margarita Grizzle: states posterior neck LN are not pathologically enlarged. This explained to pt and her family. They remain NT to palp without overlying erythema. Msk spasm the likely cause of her  neck pain; will tx symptomatically at this time. Verb understanding. Pt is requesting to leave now, stating she feels better and "needs a work note." Dx and testing d/w pt and family.  Questions answered.  Verb understanding, agreeable to d/c home with outpt f/u.        Laray Anger, DO 12/31/13 [phone]

## 2013-12-29 NOTE — ED Notes (Signed)
Patient unable to void at this time

## 2014-07-16 ENCOUNTER — Emergency Department (HOSPITAL_COMMUNITY)
Admission: EM | Admit: 2014-07-16 | Discharge: 2014-07-17 | Disposition: A | Discharge status: Home / Self Care | Payer: Medicaid Other | Attending: Emergency Medicine | Admitting: Emergency Medicine

## 2014-07-16 ENCOUNTER — Encounter (HOSPITAL_COMMUNITY): Payer: Self-pay | Admitting: Emergency Medicine

## 2014-07-16 DIAGNOSIS — F329 Major depressive disorder, single episode, unspecified: Secondary | ICD-10-CM | POA: Insufficient documentation

## 2014-07-16 DIAGNOSIS — F3289 Other specified depressive episodes: Secondary | ICD-10-CM | POA: Diagnosis not present

## 2014-07-16 DIAGNOSIS — R1032 Left lower quadrant pain: Secondary | ICD-10-CM | POA: Diagnosis present

## 2014-07-16 DIAGNOSIS — Z9889 Other specified postprocedural states: Secondary | ICD-10-CM | POA: Diagnosis not present

## 2014-07-16 DIAGNOSIS — Z8619 Personal history of other infectious and parasitic diseases: Secondary | ICD-10-CM | POA: Insufficient documentation

## 2014-07-16 DIAGNOSIS — F17200 Nicotine dependence, unspecified, uncomplicated: Secondary | ICD-10-CM | POA: Insufficient documentation

## 2014-07-16 DIAGNOSIS — Z21 Asymptomatic human immunodeficiency virus [HIV] infection status: Secondary | ICD-10-CM | POA: Insufficient documentation

## 2014-07-16 DIAGNOSIS — R1030 Lower abdominal pain, unspecified: Secondary | ICD-10-CM

## 2014-07-16 DIAGNOSIS — F411 Generalized anxiety disorder: Secondary | ICD-10-CM | POA: Diagnosis not present

## 2014-07-16 DIAGNOSIS — Z3202 Encounter for pregnancy test, result negative: Secondary | ICD-10-CM | POA: Insufficient documentation

## 2014-07-16 DIAGNOSIS — J45909 Unspecified asthma, uncomplicated: Secondary | ICD-10-CM | POA: Insufficient documentation

## 2014-07-16 DIAGNOSIS — Z9851 Tubal ligation status: Secondary | ICD-10-CM | POA: Diagnosis not present

## 2014-07-16 LAB — CBC WITH DIFFERENTIAL/PLATELET
BASOS ABS: 0 10*3/uL (ref 0.0–0.1)
BASOS PCT: 0 % (ref 0–1)
Eosinophils Absolute: 0.1 10*3/uL (ref 0.0–0.7)
Eosinophils Relative: 1 % (ref 0–5)
HEMATOCRIT: 38 % (ref 36.0–46.0)
HEMOGLOBIN: 12.6 g/dL (ref 12.0–15.0)
Lymphocytes Relative: 25 % (ref 12–46)
Lymphs Abs: 2.2 10*3/uL (ref 0.7–4.0)
MCH: 27.3 pg (ref 26.0–34.0)
MCHC: 33.2 g/dL (ref 30.0–36.0)
MCV: 82.3 fL (ref 78.0–100.0)
MONO ABS: 0.5 10*3/uL (ref 0.1–1.0)
MONOS PCT: 6 % (ref 3–12)
Neutro Abs: 6.1 10*3/uL (ref 1.7–7.7)
Neutrophils Relative %: 68 % (ref 43–77)
Platelets: 266 10*3/uL (ref 150–400)
RBC: 4.62 MIL/uL (ref 3.87–5.11)
RDW: 13.9 % (ref 11.5–15.5)
WBC: 8.9 10*3/uL (ref 4.0–10.5)

## 2014-07-16 LAB — COMPREHENSIVE METABOLIC PANEL
ALBUMIN: 3.5 g/dL (ref 3.5–5.2)
ALT: 15 U/L (ref 0–35)
AST: 20 U/L (ref 0–37)
Alkaline Phosphatase: 78 U/L (ref 39–117)
Anion gap: 13 (ref 5–15)
BUN: 11 mg/dL (ref 6–23)
CO2: 22 mEq/L (ref 19–32)
CREATININE: 0.65 mg/dL (ref 0.50–1.10)
Calcium: 9.1 mg/dL (ref 8.4–10.5)
Chloride: 107 mEq/L (ref 96–112)
GFR calc Af Amer: >90 mL/min (ref >90)
GFR calc non Af Amer: >90 mL/min (ref >90)
Glucose, Bld: 86 mg/dL (ref 70–99)
Potassium: 4.1 mEq/L (ref 3.7–5.3)
Sodium: 142 mEq/L (ref 137–147)
TOTAL PROTEIN: 7.9 g/dL (ref 6.0–8.3)
Total Bilirubin: 0.4 mg/dL (ref 0.3–1.2)

## 2014-07-16 LAB — URINALYSIS, ROUTINE W REFLEX MICROSCOPIC
BILIRUBIN URINE: NEGATIVE
Glucose, UA: NEGATIVE mg/dL
Ketones, ur: NEGATIVE mg/dL
LEUKOCYTES UA: NEGATIVE
NITRITE: NEGATIVE
Protein, ur: 30 mg/dL — AB
SPECIFIC GRAVITY, URINE: 1.035 — AB (ref 1.005–1.030)
UROBILINOGEN UA: 0.2 mg/dL (ref 0.0–1.0)
pH: 5.5 (ref 5.0–8.0)

## 2014-07-16 LAB — URINE MICROSCOPIC-ADD ON

## 2014-07-16 LAB — LIPASE, BLOOD: LIPASE: 32 U/L (ref 11–59)

## 2014-07-16 MED ORDER — OXYCODONE-ACETAMINOPHEN 5-325 MG PO TABS
2.0000 | ORAL_TABLET | Freq: Once | ORAL | Status: AC
  Administered 2014-07-17: 2 via ORAL
  Filled 2014-07-16: qty 2

## 2014-07-16 NOTE — ED Notes (Signed)
Pt transported by EMS from home from work with c/o abd pain onset 12 hours ago, +nausea.

## 2014-07-16 NOTE — ED Notes (Signed)
Per pt she has lower abdominal pain, dizziness and numbness to left leg.  Pt rates pain 10/10 in LLQ.

## 2014-07-17 LAB — PREGNANCY, URINE: Preg Test, Ur: NEGATIVE

## 2014-07-17 MED ORDER — HYDROCODONE-ACETAMINOPHEN 5-325 MG PO TABS: 1.0000 | ORAL_TABLET | ORAL | Status: DC | PRN

## 2014-07-17 MED ORDER — TRAMADOL HCL 50 MG PO TABS: 50.0000 mg | ORAL_TABLET | Freq: Four times a day (QID) | ORAL | Status: DC | PRN

## 2014-07-17 NOTE — ED Provider Notes (Signed)
CSN: [id number]     Arrival date & time 07/16/14  2030 History   First MD Initiated Contact with Patient 07/16/14 2304     Chief Complaint  Patient presents with  . Abdominal Pain      HPI Patient reports moderate left lower abdominal pain over the past 12 hours.  Denies nausea vomiting or diarrhea.  She states she always has this type of discomfort when her menstrual cycle comes on.  She states the discomfort is slightly worse tonight and made her leave work.  She tried Tylenol prior to arrival without improvement in her symptoms.  Denies fevers and chills.  Denies dysuria or urinary frequency.  Denies vaginal complaints.  No flank pain.  Some of this left lower abdominal pain radiates into her left anterior thigh.  No numbness or weakness in her legs   Past Medical History  Diagnosis Date  . Bipolar 1 disorder   . Schizophrenia   . Hepatitis B   . HIV (human immunodeficiency virus infection)   . Asthma   . Drug abuse   . Obese    Past Surgical History  Procedure Laterality Date  . Tympanostomy tube placement    . Hernia repair    . Tubal ligation     Family History  Problem Relation Age of Onset  . Hypertension     History  Substance Use Topics  . Smoking status: Current Every Day Smoker    Last Attempt to Quit: 11/26/2007  . Smokeless tobacco: Not on file  . Alcohol Use: Yes     Comment: occ   OB History   Grav Para Term Preterm Abortions TAB SAB Ect Mult Living                 Review of Systems  All other systems reviewed and are negative.     Allergies  Aspirin and Norco  Home Medications   Prior to Admission medications   Medication Sig Start Date End Date Taking? Authorizing Provider  acetaminophen (TYLENOL) 500 MG tablet Take 2,000 mg by mouth every 6 (six) hours as needed (for pain.).   Yes Historical Provider, MD  HYDROcodone-acetaminophen (NORCO/VICODIN) 5-325 MG per tablet Take 1 tablet by mouth every 4 (four) hours as needed for moderate pain.  07/17/14   Lyanne Co, MD  traMADol (ULTRAM) 50 MG tablet Take 1 tablet (50 mg total) by mouth every 6 (six) hours as needed. 07/17/14   Lyanne Co, MD   BP 128/72  Pulse 72  Temp(Src) 98.1 F (36.7 C) (Oral)  Resp 18  SpO2 97%  LMP 07/15/2014 Physical Exam  Nursing note and vitals reviewed. Constitutional: She is oriented to person, place, and time. She appears well-developed and well-nourished. No distress.  HENT:  Head: Normocephalic and atraumatic.  Eyes: EOM are normal.  Neck: Normal range of motion.  Cardiovascular: Normal rate, regular rhythm and normal heart sounds.   Pulmonary/Chest: Effort normal and breath sounds normal.  Abdominal: Soft. She exhibits no distension.  Very mild left lower quadrant tenderness without guarding or rebound.  No masses.  No overlying skin changes  Musculoskeletal: Normal range of motion.  Full range of motion bilateral hips and knees.  Normal pulses in PT and DP pulses bilaterally  Neurological: She is alert and oriented to person, place, and time.  Skin: Skin is warm and dry.  Psychiatric: She has a normal mood and affect. Judgment normal.    ED Course  Procedures (including critical care  time) Labs Review Labs Reviewed  URINALYSIS, ROUTINE W REFLEX MICROSCOPIC - Abnormal; Notable for the following:    Color, Urine RED (*)    APPearance CLOUDY (*)    Specific Gravity, Urine 1.035 (*)    Hgb urine dipstick LARGE (*)    Protein, ur 30 (*)    All other components within normal limits  URINE MICROSCOPIC-ADD ON - Abnormal; Notable for the following:    Squamous Epithelial / LPF MANY (*)    All other components within normal limits  URINE CULTURE  CBC WITH DIFFERENTIAL  COMPREHENSIVE METABOLIC PANEL  LIPASE, BLOOD  PREGNANCY, URINE    Imaging Review No results found.   EKG Interpretation None      MDM   Final diagnoses:  Lower abdominal pain    Doubt diverticulitis.  No right lower quadrant tenderness.  No  indication for imaging at this time.  Pain will be treated.  Urine pregnancy test pending at this time.  Urinalysis demonstrates blood and many epithelial cells.  Likely contaminant.  Urine culture sent.    Lyanne Co, MD 07/17/14 [phone]

## 2014-07-17 NOTE — Discharge Instructions (Signed)

## 2014-07-18 LAB — URINE CULTURE
CULTURE: NO GROWTH
Colony Count: NO GROWTH

## 2014-08-24 ENCOUNTER — Emergency Department (HOSPITAL_COMMUNITY)
Admission: EM | Admit: 2014-08-24 | Discharge: 2014-08-24 | Disposition: A | Discharge status: Home / Self Care | Payer: Medicaid Other | Attending: Emergency Medicine | Admitting: Emergency Medicine

## 2014-08-24 ENCOUNTER — Encounter (HOSPITAL_COMMUNITY): Payer: Self-pay | Admitting: Emergency Medicine

## 2014-08-24 DIAGNOSIS — K0889 Other specified disorders of teeth and supporting structures: Secondary | ICD-10-CM

## 2014-08-24 DIAGNOSIS — F17200 Nicotine dependence, unspecified, uncomplicated: Secondary | ICD-10-CM | POA: Insufficient documentation

## 2014-08-24 DIAGNOSIS — Z8619 Personal history of other infectious and parasitic diseases: Secondary | ICD-10-CM | POA: Insufficient documentation

## 2014-08-24 DIAGNOSIS — J45909 Unspecified asthma, uncomplicated: Secondary | ICD-10-CM | POA: Insufficient documentation

## 2014-08-24 DIAGNOSIS — K089 Disorder of teeth and supporting structures, unspecified: Secondary | ICD-10-CM | POA: Insufficient documentation

## 2014-08-24 DIAGNOSIS — H9209 Otalgia, unspecified ear: Secondary | ICD-10-CM | POA: Insufficient documentation

## 2014-08-24 DIAGNOSIS — Z8659 Personal history of other mental and behavioral disorders: Secondary | ICD-10-CM | POA: Insufficient documentation

## 2014-08-24 DIAGNOSIS — E669 Obesity, unspecified: Secondary | ICD-10-CM | POA: Insufficient documentation

## 2014-08-24 DIAGNOSIS — Z21 Asymptomatic human immunodeficiency virus [HIV] infection status: Secondary | ICD-10-CM | POA: Diagnosis not present

## 2014-08-24 MED ORDER — IBUPROFEN 800 MG PO TABS
800.0000 mg | ORAL_TABLET | Freq: Once | ORAL | Status: AC
  Administered 2014-08-24: 800 mg via ORAL
  Filled 2014-08-24: qty 1

## 2014-08-24 MED ORDER — IBUPROFEN 800 MG PO TABS: 800.0000 mg | ORAL_TABLET | Freq: Three times a day (TID) | ORAL | Status: DC | PRN

## 2014-08-24 MED ORDER — PENICILLIN V POTASSIUM 500 MG PO TABS: 500.0000 mg | ORAL_TABLET | Freq: Three times a day (TID) | ORAL | Status: DC

## 2014-08-24 NOTE — ED Provider Notes (Signed)
Medical screening examination/treatment/procedure(s) were performed by non-physician practitioner and as supervising physician I was immediately available for consultation/collaboration.   EKG Interpretation None       Arby Barrette, MD 08/24/14 1050

## 2014-08-24 NOTE — Discharge Instructions (Signed)
Please follow up with dentist in the next 2 days for further management of your dental pain.   Dental Pain A tooth ache may be caused by cavities (tooth decay). Cavities expose the nerve of the tooth to air and hot or cold temperatures. It may come from an infection or abscess (also called a boil or furuncle) around your tooth. It is also often caused by dental caries (tooth decay). This causes the pain you are having. DIAGNOSIS  Your caregiver can diagnose this problem by exam. TREATMENT   If caused by an infection, it may be treated with medications which kill germs (antibiotics) and pain medications as prescribed by your caregiver. Take medications as directed.  Only take over-the-counter or prescription medicines for pain, discomfort, or fever as directed by your caregiver.  Whether the tooth ache today is caused by infection or dental disease, you should see your dentist as soon as possible for further care. SEEK MEDICAL CARE IF: The exam and treatment you received today has been provided on an emergency basis only. This is not a substitute for complete medical or dental care. If your problem worsens or new problems (symptoms) appear, and you are unable to meet with your dentist, call or return to this location. SEEK IMMEDIATE MEDICAL CARE IF:   You have a fever.  You develop redness and swelling of your face, jaw, or neck.  You are unable to open your mouth.  You have severe pain uncontrolled by pain medicine. MAKE SURE YOU:   Understand these instructions.  Will watch your condition.  Will get help right away if you are not doing well or get worse. Document Released: 11/11/2005 Document Revised: 02/03/2012 Document Reviewed: 06/29/2008 [facility] Patient Information 2015 [place], [place]. This information is not intended to replace advice given to you by your health care provider. Make sure you discuss any questions you have with your health care provider.

## 2014-08-24 NOTE — ED Provider Notes (Signed)
CSN: [id number]     Arrival date & time 08/24/14  4098 History   First MD Initiated Contact with Patient 08/24/14 1003     Chief Complaint  Patient presents with  . Otalgia  . Dental Pain     (Consider location/radiation/quality/duration/timing/severity/associated sxs/prior Treatment) HPI  28 year old female with history of HIV, hepatitis B, bipolar, schizophrenia, history of drug abuse who presents complaining of dental pain. Patient reports gradual onset of pain to the left lower tooth, persistent, progressively worsening in the past 4 days. For the past 2 days pain this radiates to the left in. Describe pain as a sharp and shooting sensation, worsening with chewing, biting, or eating. Patient has tried taking over-the-counter medication including Tylenol, ibuprofen with minimal relief. She has been using warm compress without any improvement of symptoms. No associated fever, runny nose, sore throat, hearing loss, trouble swallowing, or rash. She denies any recent trauma. She has not been seen by a dentist.  Past Medical History  Diagnosis Date  . Bipolar 1 disorder   . Schizophrenia   . Hepatitis B   . HIV (human immunodeficiency virus infection)   . Asthma   . Drug abuse   . Obese    Past Surgical History  Procedure Laterality Date  . Tympanostomy tube placement    . Hernia repair    . Tubal ligation     Family History  Problem Relation Age of Onset  . Hypertension     History  Substance Use Topics  . Smoking status: Current Every Day Smoker    Last Attempt to Quit: 11/26/2007  . Smokeless tobacco: Not on file  . Alcohol Use: Yes     Comment: occ   OB History   Grav Para Term Preterm Abortions TAB SAB Ect Mult Living                 Review of Systems  Constitutional: Negative for fever.  HENT: Positive for dental problem.   Skin: Negative for rash.      Allergies  Aspirin and Norco  Home Medications   Prior to Admission medications   Medication Sig  Start Date End Date Taking? Authorizing Provider  acetaminophen (TYLENOL) 500 MG tablet Take 1,500 mg by mouth every 6 (six) hours as needed (for pain.).    Yes Historical Provider, MD  traMADol (ULTRAM) 50 MG tablet Take 1 tablet (50 mg total) by mouth every 6 (six) hours as needed. 07/17/14  Yes Lyanne Co, MD   BP 143/97  Pulse 96  Temp(Src) 98 F (36.7 C) (Oral)  Resp 17  SpO2 100% Physical Exam  Nursing note and vitals reviewed. Constitutional: She appears well-developed and well-nourished. No distress.  HENT:  Head: Atraumatic.  Right Ear: External ear normal.  Left Ear: External ear normal.  Nose: Nose normal.  Mouth/Throat: Oropharynx is clear and moist. No oropharyngeal exudate.  Mouth: Tenderness to tooth #18 on palpation without any significant surrounding erythema, no abscess, no intrusion or extrusion. Amalgam filling to tooth is intact.    No trismus.    Eyes: Conjunctivae are normal.  Neck: Neck supple.  Lymphadenopathy:    She has no cervical adenopathy.  Neurological: She is alert.  Skin: No rash noted.  Psychiatric: She has a normal mood and affect.    ED Course  Procedures (including critical care time)  10:18 AM Dental pain, no evidence of deep tissue infection. No facial swelling. Normal ears exam and no evidence of ear infection or  mastoiditis. No TMJ. Patient discharged with NSAIDs and antibiotic along with close followup with dentist. Return precautions given   Labs Review Labs Reviewed - No data to display  Imaging Review No results found.   EKG Interpretation None      MDM   Final diagnoses:  Pain, dental    BP 143/97  Pulse 96  Temp(Src) 98 F (36.7 C) (Oral)  Resp 17  SpO2 100%  I have reviewed nursing notes and vital signs. I personally reviewed the imaging tests through PACS system  I reviewed available ER/hospitalization records thought the EMR     Fayrene HelperBowie Clarion Mooneyhan, PA-C 08/24/14 1019

## 2014-08-24 NOTE — ED Notes (Signed)
Pt tearful in triage.  States that she has been having dental pain x 2 days w/ otalgia and ear drainage.

## 2014-09-19 ENCOUNTER — Emergency Department (HOSPITAL_COMMUNITY): Payer: Medicaid Other

## 2014-09-19 ENCOUNTER — Emergency Department (HOSPITAL_COMMUNITY)
Admission: EM | Admit: 2014-09-19 | Discharge: 2014-09-19 | Disposition: A | Discharge status: Home / Self Care | Payer: Medicaid Other | Attending: Emergency Medicine | Admitting: Emergency Medicine

## 2014-09-19 ENCOUNTER — Encounter (HOSPITAL_COMMUNITY): Payer: Self-pay | Admitting: Emergency Medicine

## 2014-09-19 DIAGNOSIS — W010XXA Fall on same level from slipping, tripping and stumbling without subsequent striking against object, initial encounter: Secondary | ICD-10-CM | POA: Insufficient documentation

## 2014-09-19 DIAGNOSIS — S060X0A Concussion without loss of consciousness, initial encounter: Secondary | ICD-10-CM | POA: Diagnosis not present

## 2014-09-19 DIAGNOSIS — Z21 Asymptomatic human immunodeficiency virus [HIV] infection status: Secondary | ICD-10-CM | POA: Insufficient documentation

## 2014-09-19 DIAGNOSIS — S199XXA Unspecified injury of neck, initial encounter: Secondary | ICD-10-CM | POA: Insufficient documentation

## 2014-09-19 DIAGNOSIS — Y929 Unspecified place or not applicable: Secondary | ICD-10-CM | POA: Diagnosis not present

## 2014-09-19 DIAGNOSIS — Z79899 Other long term (current) drug therapy: Secondary | ICD-10-CM | POA: Diagnosis not present

## 2014-09-19 DIAGNOSIS — Z720 Tobacco use: Secondary | ICD-10-CM | POA: Insufficient documentation

## 2014-09-19 DIAGNOSIS — Y939 Activity, unspecified: Secondary | ICD-10-CM | POA: Insufficient documentation

## 2014-09-19 DIAGNOSIS — Z8619 Personal history of other infectious and parasitic diseases: Secondary | ICD-10-CM | POA: Insufficient documentation

## 2014-09-19 DIAGNOSIS — E669 Obesity, unspecified: Secondary | ICD-10-CM | POA: Diagnosis not present

## 2014-09-19 DIAGNOSIS — S4991XA Unspecified injury of right shoulder and upper arm, initial encounter: Secondary | ICD-10-CM | POA: Insufficient documentation

## 2014-09-19 DIAGNOSIS — Z8659 Personal history of other mental and behavioral disorders: Secondary | ICD-10-CM | POA: Insufficient documentation

## 2014-09-19 DIAGNOSIS — J45909 Unspecified asthma, uncomplicated: Secondary | ICD-10-CM | POA: Insufficient documentation

## 2014-09-19 DIAGNOSIS — S0990XA Unspecified injury of head, initial encounter: Secondary | ICD-10-CM

## 2014-09-19 DIAGNOSIS — W19XXXA Unspecified fall, initial encounter: Secondary | ICD-10-CM

## 2014-09-19 MED ORDER — OXYCODONE-ACETAMINOPHEN 5-325 MG PO TABS
2.0000 | ORAL_TABLET | Freq: Once | ORAL | Status: AC
  Administered 2014-09-19: 2 via ORAL
  Filled 2014-09-19: qty 2

## 2014-09-19 NOTE — ED Notes (Signed)
Pt sts slip and fall yesterday and since has been having right arm and shoulder pain.

## 2014-09-19 NOTE — Discharge Instructions (Signed)
Return to the emergency room with worsening of symptoms, new symptoms or with symptoms that are concerning, especially severe worsening of headache, visual or speech changes, weakness in face, arms or legs. Ibuprofen 400mg  (2 tablets 200mg ) every 5-6 hours for 3-5 days and then as needed for pain. Follow up with wellness center in one week. Call to make appointment.    Concussion A concussion, or closed-head injury, is a brain injury caused by a direct blow to the head or by a quick and sudden movement (jolt) of the head or neck. Concussions are usually not life-threatening. Even so, the effects of a concussion can be serious. If you have had a concussion before, you are more likely to experience concussion-like symptoms after a direct blow to the head.  CAUSES  Direct blow to the head, such as from running into another player during a soccer game, being hit in a fight, or hitting your head on a hard surface.  A jolt of the head or neck that causes the brain to move back and forth inside the skull, such as in a car crash. SIGNS AND SYMPTOMS The signs of a concussion can be hard to notice. Early on, they may be missed by you, family members, and health care providers. You may look fine but act or feel differently. Symptoms are usually temporary, but they may last for days, weeks, or even longer. Some symptoms may appear right away while others may not show up for hours or days. Every head injury is different. Symptoms include:  Mild to moderate headaches that will not go away.  A feeling of pressure inside your head.  Having more trouble than usual:  Learning or remembering things you have heard.  Answering questions.  Paying attention or concentrating.  Organizing daily tasks.  Making decisions and solving problems.  Slowness in thinking, acting or reacting, speaking, or reading.  Getting lost or being easily confused.  Feeling tired all the time or lacking energy  (fatigued).  Feeling drowsy.  Sleep disturbances.  Sleeping more than usual.  Sleeping less than usual.  Trouble falling asleep.  Trouble sleeping (insomnia).  Loss of balance or feeling lightheaded or dizzy.  Nausea or vomiting.  Numbness or tingling.  Increased sensitivity to:  Sounds.  Lights.  Distractions.  Vision problems or eyes that tire easily.  Diminished sense of taste or smell.  Ringing in the ears.  Mood changes such as feeling sad or anxious.  Becoming easily irritated or angry for little or no reason.  Lack of motivation.  Seeing or hearing things other people do not see or hear (hallucinations). DIAGNOSIS Your health care provider can usually diagnose a concussion based on a description of your injury and symptoms. He or she will ask whether you passed out (lost consciousness) and whether you are having trouble remembering events that happened right before and during your injury. Your evaluation might include:  A brain scan to look for signs of injury to the brain. Even if the test shows no injury, you may still have a concussion.  Blood tests to be sure other problems are not present. TREATMENT  Concussions are usually treated in an emergency department, in urgent care, or at a clinic. You may need to stay in the hospital overnight for further treatment.  Tell your health care provider if you are taking any medicines, including prescription medicines, over-the-counter medicines, and natural remedies. Some medicines, such as blood thinners (anticoagulants) and aspirin, may increase the chance of complications.  Also tell your health care provider whether you have had alcohol or are taking illegal drugs. This information may affect treatment.  Your health care provider will send you home with important instructions to follow.  How fast you will recover from a concussion depends on many factors. These factors include how severe your concussion is,  what part of your brain was injured, your age, and how healthy you were before the concussion.  Most people with mild injuries recover fully. Recovery can take time. In general, recovery is slower in older persons. Also, persons who have had a concussion in the past or have other medical problems may find that it takes longer to recover from their current injury. HOME CARE INSTRUCTIONS General Instructions  Carefully follow the directions your health care provider gave you.  Only take over-the-counter or prescription medicines for pain, discomfort, or fever as directed by your health care provider.  Take only those medicines that your health care provider has approved.  Do not drink alcohol until your health care provider says you are well enough to do so. Alcohol and certain other drugs may slow your recovery and can put you at risk of further injury.  If it is harder than usual to remember things, write them down.  If you are easily distracted, try to do one thing at a time. For example, do not try to watch TV while fixing dinner.  Talk with family members or close friends when making important decisions.  Keep all follow-up appointments. Repeated evaluation of your symptoms is recommended for your recovery.  Watch your symptoms and tell others to do the same. Complications sometimes occur after a concussion. Older adults with a brain injury may have a higher risk of serious complications, such as a blood clot on the brain.  Tell your teachers, school nurse, school counselor, coach, athletic trainer, or work Production designer, theatre/television/filmmanager about your injury, symptoms, and restrictions. Tell them about what you can or cannot do. They should watch for:  Increased problems with attention or concentration.  Increased difficulty remembering or learning new information.  Increased time needed to complete tasks or assignments.  Increased irritability or decreased ability to cope with stress.  Increased  symptoms.  Rest. Rest helps the brain to heal. Make sure you:  Get plenty of sleep at night. Avoid staying up late at night.  Keep the same bedtime hours on weekends and weekdays.  Rest during the day. Take daytime naps or rest breaks when you feel tired.  Limit activities that require a lot of thought or concentration. These include:  Doing homework or job-related work.  Watching TV.  Working on the computer.  Avoid any situation where there is potential for another head injury (football, hockey, soccer, basketball, martial arts, downhill snow sports and horseback riding). Your condition will get worse every time you experience a concussion. You should avoid these activities until you are evaluated by the appropriate follow-up health care providers. Returning To Your Regular Activities You will need to return to your normal activities slowly, not all at once. You must give your body and brain enough time for recovery.  Do not return to sports or other athletic activities until your health care provider tells you it is safe to do so.  Ask your health care provider when you can drive, ride a bicycle, or operate heavy machinery. Your ability to react may be slower after a brain injury. Never do these activities if you are dizzy.  Ask your health care  provider about when you can return to work or school. Preventing Another Concussion It is very important to avoid another brain injury, especially before you have recovered. In rare cases, another injury can lead to permanent brain damage, brain swelling, or death. The risk of this is greatest during the first 7-10 days after a head injury. Avoid injuries by:  Wearing a seat belt when riding in a car.  Drinking alcohol only in moderation.  Wearing a helmet when biking, skiing, skateboarding, skating, or doing similar activities.  Avoiding activities that could lead to a second concussion, such as contact or recreational sports, until  your health care provider says it is okay.  Taking safety measures in your home.  Remove clutter and tripping hazards from floors and stairways.  Use grab bars in bathrooms and handrails by stairs.  Place non-slip mats on floors and in bathtubs.  Improve lighting in dim areas. SEEK MEDICAL CARE IF:  You have increased problems paying attention or concentrating.  You have increased difficulty remembering or learning new information.  You need more time to complete tasks or assignments than before.  You have increased irritability or decreased ability to cope with stress.  You have more symptoms than before. Seek medical care if you have any of the following symptoms for more than 2 weeks after your injury:  Lasting (chronic) headaches.  Dizziness or balance problems.  Nausea.  Vision problems.  Increased sensitivity to noise or light.  Depression or mood swings.  Anxiety or irritability.  Memory problems.  Difficulty concentrating or paying attention.  Sleep problems.  Feeling tired all the time. SEEK IMMEDIATE MEDICAL CARE IF:  You have severe or worsening headaches. These may be a sign of a blood clot in the brain.  You have weakness (even if only in one hand, leg, or part of the face).  You have numbness.  You have decreased coordination.  You vomit repeatedly.  You have increased sleepiness.  One pupil is larger than the other.  You have convulsions.  You have slurred speech.  You have increased confusion. This may be a sign of a blood clot in the brain.  You have increased restlessness, agitation, or irritability.  You are unable to recognize people or places.  You have neck pain.  It is difficult to wake you up.  You have unusual behavior changes.  You lose consciousness. MAKE SURE YOU:  Understand these instructions.  Will watch your condition.  Will get help right away if you are not doing well or get worse. Document  Released: 02/01/2004 Document Revised: 11/16/2013 Document Reviewed: 06/03/2013 [facility] Patient Information 2015 [place], [place]. This information is not intended to replace advice given to you by your health care provider. Make sure you discuss any questions you have with your health care provider.

## 2014-09-19 NOTE — ED Provider Notes (Signed)
CSN: [id number]     Arrival date & time 09/19/14  1414 History  This chart was scribed for Penny ConroyVictoria Carlton Buskey, PA-C working with Arby BarretteMarcy Pfeiffer, MD by Evon Slackerrance Branch, ED Scribe. This patient was seen in room TR09C/TR09C and the patient's care was started at 4:36 PM.     Chief Complaint  Patient presents with  . Fall    Patient is a 28 y.o. female presenting with fall. The history is provided by the patient. No language interpreter was used.  Fall Associated symptoms include headaches. Pertinent negatives include no chest pain.   HPI Comments: Penny Young is a 28 y.o. female who presents to the Emergency Department complaining of fall onset 1 day ago. She states she is having right arm and right shoulder pain that's radiating into the right side of her neck. She states she is also having gradual intermittent aching headaches. She states she has vomited x2 nb/nb. She states that she slipped and fell backwards on to her back from a standing height. She states she did hit her head on the ground but denies LOC. She denies visual changes, photophobia, seizures, slurred speech, weakness, fever, chills or chest pain.     Past Medical History  Diagnosis Date  . Bipolar 1 disorder   . Schizophrenia   . Hepatitis B   . HIV (human immunodeficiency virus infection)   . Asthma   . Drug abuse   . Obese    Past Surgical History  Procedure Laterality Date  . Tympanostomy tube placement    . Hernia repair    . Tubal ligation     Family History  Problem Relation Age of Onset  . Hypertension     History  Substance Use Topics  . Smoking status: Current Every Day Smoker    Last Attempt to Quit: 11/26/2007  . Smokeless tobacco: Not on file  . Alcohol Use: Yes     Comment: occ   OB History   Grav Para Term Preterm Abortions TAB SAB Ect Mult Living                 Review of Systems  Constitutional: Negative for fever and chills.  Eyes: Negative for visual disturbance.  Cardiovascular:  Negative for chest pain.  Gastrointestinal: Positive for nausea and vomiting.  Musculoskeletal: Positive for arthralgias, myalgias and neck pain. Negative for back pain.  Neurological: Positive for headaches. Negative for syncope, speech difficulty and weakness.  All other systems reviewed and are negative.    Allergies  Aspirin and Norco  Home Medications   Prior to Admission medications   Medication Sig Start Date End Date Taking? Authorizing Provider  acetaminophen (TYLENOL) 500 MG tablet Take 1,500 mg by mouth every 6 (six) hours as needed (for pain.).     Historical Provider, MD  ibuprofen (ADVIL,MOTRIN) 800 MG tablet Take 1 tablet (800 mg total) by mouth every 8 (eight) hours as needed. 08/24/14   Fayrene HelperBowie Tran, PA-C  penicillin v potassium (VEETID) 500 MG tablet Take 1 tablet (500 mg total) by mouth 3 (three) times daily. 08/24/14   Fayrene HelperBowie Tran, PA-C  traMADol (ULTRAM) 50 MG tablet Take 1 tablet (50 mg total) by mouth every 6 (six) hours as needed. 07/17/14   Lyanne CoKevin M Campos, MD   Triage Vitals: BP 145/98  Pulse 90  Temp(Src) 98.3 F (36.8 C)  Resp 18  SpO2 98%  LMP 09/15/2014   Physical Exam  Nursing note and vitals reviewed. Constitutional: She appears well-developed and well-nourished.  No distress.  HENT:  Head: Normocephalic and atraumatic.  Mouth/Throat: Oropharynx is clear and moist.  Eyes: Conjunctivae and EOM are normal. Pupils are equal, round, and reactive to light. Right eye exhibits no discharge. Left eye exhibits no discharge.  Neck: Normal range of motion. Neck supple.  No nuchal rigidity. No midline neck tenderness. Patient with point tenderness to right side of neck in distribution of trapezius.  Cardiovascular: Normal rate and regular rhythm.   Pulmonary/Chest: Effort normal and breath sounds normal. No respiratory distress. She has no wheezes.  Abdominal: Soft. Bowel sounds are normal. She exhibits no distension. There is no tenderness.  Musculoskeletal:  no  clavicle tenderness or stepoff appreciated, no deformity of shoulder, full rom of shoulder with pain,  No midline back tenderness, step off or crepitus. No Right or Left sided lower back tenderness.  Neurological: She is alert. No cranial nerve deficit. Coordination normal.  Speech is clear and goal oriented. Peripheral visual fields intact. Strength 5/5 in upper and lower extremities. Sensation intact. Intact rapid alternating movements, finger to nose, and heel to shin. Negative Romberg. No pronator drift. Normal gait.   Skin: Skin is warm and dry. She is not diaphoretic.    ED Course  Procedures (including critical care time) DIAGNOSTIC STUDIES: Oxygen Saturation is 98% on RA, normal by my interpretation.    COORDINATION OF CARE: 4:59 PM-Discussed treatment plan which includes ibuprofen and muscle relaxants with pt at bedside and pt agreed to plan.     Labs Review Labs Reviewed - No data to display  Imaging Review Dg Shoulder Right  09/19/2014   CLINICAL DATA:  Slipped and fell 1 day ago, right shoulder pain posteriorly  EXAM: RIGHT SHOULDER - 2+ VIEW  COMPARISON:  03/17/2007  FINDINGS: There is no evidence of fracture or dislocation. There is no evidence of arthropathy or other focal bone abnormality. Soft tissues are unremarkable.  IMPRESSION: Negative.   Electronically Signed   By: Esperanza Heir M.D.   On: 09/19/2014 16:27   Ct Head Wo Contrast  09/19/2014   CLINICAL DATA:  Head injury after fall.  Initial encounter  EXAM: CT HEAD WITHOUT CONTRAST  TECHNIQUE: Contiguous axial images were obtained from the base of the skull through the vertex without intravenous contrast.  COMPARISON:  None available in the downtime setting  FINDINGS: Skull and Sinuses:No acute fracture or destructive process. There is a solitary posterior left ethmoid air cell which is completely opacified and expanded. Minimal opacification of left mastoid air cells.  Enlarged and low-density sella, see below.   Orbits: No acute abnormality.  Brain: No evidence of acute abnormality, such as acute infarction, hemorrhage, hydrocephalus, or mass lesion/mass effect. Incidental cavum septum pellucidum. 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 Given there is no chart history of chronic headaches, this is likely an incidental finding.  IMPRESSION: 1. No acute intracranial injury. 2. Expanded left posterior ethmoid air cell, a possible mucocele.   Electronically Signed   By: Tiburcio Pea M.D.   On: 09/19/2014 20:35     EKG Interpretation None      Meds given in ED:  Medications  oxyCODONE-acetaminophen (PERCOCET/ROXICET) 5-325 MG per tablet 2 tablet (2 tablets Oral Given 09/19/14 1708)    Discharge Medication List as of 09/19/2014  8:49 PM        MDM   Final diagnoses:  Head injury  Fall, initial encounter  Concussion, without loss of consciousness, initial encounter   Patient with head injury.  Mild headache that is typical of her normal headaches that is intermittent and improving. No LOC, weakness, visual changes slurred speech. Patient with two episodes of vomiting. VSS. Completely unremarkable neurological exam no nuchal rigidity. [place] CT rule and [place] head injury rule recommend CT due to two episodes of emesis. CT without evidence of hemorrhage, mass effect. I doubt SAH, ICH.  Patient most likely with concussion. Strict return precautions for CVA provided. Patient to follow-up with wellness center in 1 week.  Pt also with shoulder and left sided neck localized neck tenderness. Treat with RICE protocol and symptomatic treatments. Patient without a PCP. Patient to establish care and follow up. ED resources provided.   Discussed return precautions with patient. Discussed all results and patient verbalizes understanding and agrees with plan.  I personally performed the services described in this documentation, which was scribed in my presence. The recorded  information has been reviewed and is accurate.     Louann Sjogren, PA-C 09/20/14 [phone]

## 2014-09-19 NOTE — ED Notes (Signed)
Declined W/C at D/C and was escorted to lobby by RN. 

## 2014-09-20 NOTE — ED Provider Notes (Signed)
Medical screening examination/treatment/procedure(s) were performed by non-physician practitioner and as supervising physician I was immediately available for consultation/collaboration.   EKG Interpretation None       Rhesa Forsberg, MD 09/20/14 2350 

## 2014-10-16 ENCOUNTER — Emergency Department (HOSPITAL_COMMUNITY)
Admission: EM | Admit: 2014-10-16 | Discharge: 2014-10-16 | Disposition: A | Discharge status: Home / Self Care | Payer: Medicaid Other | Attending: Emergency Medicine | Admitting: Emergency Medicine

## 2014-10-16 ENCOUNTER — Encounter (HOSPITAL_COMMUNITY): Payer: Self-pay | Admitting: Family Medicine

## 2014-10-16 ENCOUNTER — Emergency Department (HOSPITAL_COMMUNITY): Payer: Medicaid Other

## 2014-10-16 DIAGNOSIS — Z9851 Tubal ligation status: Secondary | ICD-10-CM | POA: Insufficient documentation

## 2014-10-16 DIAGNOSIS — Z8619 Personal history of other infectious and parasitic diseases: Secondary | ICD-10-CM | POA: Diagnosis not present

## 2014-10-16 DIAGNOSIS — M79644 Pain in right finger(s): Secondary | ICD-10-CM

## 2014-10-16 DIAGNOSIS — R079 Chest pain, unspecified: Secondary | ICD-10-CM | POA: Diagnosis not present

## 2014-10-16 DIAGNOSIS — Z8659 Personal history of other mental and behavioral disorders: Secondary | ICD-10-CM | POA: Insufficient documentation

## 2014-10-16 DIAGNOSIS — Z21 Asymptomatic human immunodeficiency virus [HIV] infection status: Secondary | ICD-10-CM | POA: Insufficient documentation

## 2014-10-16 DIAGNOSIS — M549 Dorsalgia, unspecified: Secondary | ICD-10-CM | POA: Diagnosis present

## 2014-10-16 DIAGNOSIS — Z792 Long term (current) use of antibiotics: Secondary | ICD-10-CM | POA: Diagnosis not present

## 2014-10-16 DIAGNOSIS — J45909 Unspecified asthma, uncomplicated: Secondary | ICD-10-CM | POA: Diagnosis not present

## 2014-10-16 DIAGNOSIS — E669 Obesity, unspecified: Secondary | ICD-10-CM | POA: Insufficient documentation

## 2014-10-16 DIAGNOSIS — Z720 Tobacco use: Secondary | ICD-10-CM | POA: Diagnosis not present

## 2014-10-16 DIAGNOSIS — M546 Pain in thoracic spine: Secondary | ICD-10-CM | POA: Insufficient documentation

## 2014-10-16 DIAGNOSIS — R071 Chest pain on breathing: Secondary | ICD-10-CM

## 2014-10-16 LAB — URINALYSIS, ROUTINE W REFLEX MICROSCOPIC
Bilirubin Urine: NEGATIVE
Glucose, UA: NEGATIVE mg/dL
Hgb urine dipstick: NEGATIVE
Ketones, ur: NEGATIVE mg/dL
Nitrite: NEGATIVE
Protein, ur: NEGATIVE mg/dL
Specific Gravity, Urine: 1.027 (ref 1.005–1.030)
Urobilinogen, UA: 0.2 mg/dL (ref 0.0–1.0)
pH: 7.5 (ref 5.0–8.0)

## 2014-10-16 LAB — URINE MICROSCOPIC-ADD ON

## 2014-10-16 MED ORDER — HYDROCODONE-ACETAMINOPHEN 5-325 MG PO TABS
1.0000 | ORAL_TABLET | Freq: Once | ORAL | Status: AC
  Administered 2014-10-16: 1 via ORAL

## 2014-10-16 MED ORDER — HYDROCODONE-ACETAMINOPHEN 5-325 MG PO TABS
1.0000 | ORAL_TABLET | Freq: Once | ORAL | Status: DC
  Filled 2014-10-16: qty 1

## 2014-10-16 NOTE — ED Notes (Signed)
Pt given a large ice pack for back

## 2014-10-16 NOTE — ED Notes (Addendum)
Per pt sts upper back pain and right hand pain. sts back hurts when she breathes.

## 2014-10-16 NOTE — ED Provider Notes (Signed)
CSN: [id number]     Arrival date & time 10/16/14  1850 History  This chart was scribed for Junius Finner, PA-C, working with Elwin Mocha, MD by Chestine Spore, ED Scribe. The patient was seen in room TR08C/TR08C at 7:04 PM.   Chief Complaint  Patient presents with  . Back Pain  . Hand Pain    The history is provided by the patient. No language interpreter was used.   HPI Comments: Penny Young is a 28 y.o. female who presents to the Emergency Department complaining of back pain onset yesterday. When she breathes in it is very painful and it feels like "something is there in my lung". Pain is 10/10.  Yesterday the pain was Hehr and today the pain is worsened. She states that she has tried Tylenol and Tramadol with no relief for her symptoms. She denies cough, congestion, dysuria, fever, nausea, vomiting, hematuria, leg pain, leg swelling, and any other symptoms. Denies heavy lifting or fall. Has had kidney stone in the past and the pain is not similar. Pt has tubal ligation.   Hand pain onset 2 days. No injury to the area. The pain is rated as Sonnier but painful. The pt is left handed. Hx of asthma.   Past Medical History  Diagnosis Date  . Bipolar 1 disorder   . Schizophrenia   . Hepatitis B   . HIV (human immunodeficiency virus infection)   . Asthma   . Drug abuse   . Obese    Past Surgical History  Procedure Laterality Date  . Tympanostomy tube placement    . Hernia repair    . Tubal ligation     Family History  Problem Relation Age of Onset  . Hypertension     History  Substance Use Topics  . Smoking status: Current Every Day Smoker    Last Attempt to Quit: 11/26/2007  . Smokeless tobacco: Not on file  . Alcohol Use: Yes     Comment: occ   OB History    No data available     Review of Systems  Constitutional: Negative for fever.  HENT: Negative for congestion.   Respiratory: Negative for cough.   Cardiovascular: Negative for leg swelling.  Gastrointestinal:  Negative for nausea and vomiting.  Genitourinary: Negative for dysuria and hematuria.  Musculoskeletal: Positive for back pain and arthralgias (right hand).      Allergies  Aspirin and Norco  Home Medications   Prior to Admission medications   Medication Sig Start Date End Date Taking? Authorizing Provider  acetaminophen (TYLENOL) 500 MG tablet Take 1,500 mg by mouth every 6 (six) hours as needed (for pain.).     Historical Provider, MD  ibuprofen (ADVIL,MOTRIN) 800 MG tablet Take 1 tablet (800 mg total) by mouth every 8 (eight) hours as needed. 08/24/14   Fayrene Helper, PA-C  penicillin v potassium (VEETID) 500 MG tablet Take 1 tablet (500 mg total) by mouth 3 (three) times daily. 08/24/14   Fayrene Helper, PA-C  traMADol (ULTRAM) 50 MG tablet Take 1 tablet (50 mg total) by mouth every 6 (six) hours as needed. 07/17/14   Lyanne Co, MD   BP 132/80 mmHg  Pulse 76  Temp(Src) 98.5 F (36.9 C)  Resp 18  Ht 5\' 7"  (1.702 m)  Wt 230 lb (104.327 kg)  BMI 36.01 kg/m2  SpO2 100%  LMP 09/15/2014 Physical Exam  Constitutional: She is oriented to person, place, and time. She appears well-developed and well-nourished. No distress.  HENT:  Head: Normocephalic and atraumatic.  Eyes: EOM are normal.  Neck: Neck supple. No tracheal deviation present.  Cardiovascular: Normal rate.   Pulmonary/Chest: Effort normal. No respiratory distress. She exhibits no crepitus.  Tender in the left side of mid back over posterior ribs. No crepitus.   Musculoskeletal: Normal range of motion. She exhibits tenderness. She exhibits no edema.  No midline spinal tenderness.  Right hand: mild tenderness to proximal aspect of right ring finger. No edema, ecchymosis, or erythema. FROM. 5/5 grip strength bilaterally  Neurological: She is alert and oriented to person, place, and time.  Skin: Skin is warm and dry.  Psychiatric: She has a normal mood and affect. Her behavior is normal.  Nursing note and vitals  reviewed.   ED Course  Procedures (including critical care time) DIAGNOSTIC STUDIES: Oxygen Saturation is 99% on room air, normal by my interpretation.    COORDINATION OF CARE: 7:10 PM-Discussed treatment plan with pt at bedside and pt agreed to plan.   Labs Review Labs Reviewed  URINALYSIS, ROUTINE W REFLEX MICROSCOPIC - Abnormal; Notable for the following:    APPearance CLOUDY (*)    Leukocytes, UA TRACE (*)    All other components within normal limits  URINE MICROSCOPIC-ADD ON - Abnormal; Notable for the following:    Squamous Epithelial / LPF MANY (*)    Bacteria, UA MANY (*)    All other components within normal limits    Imaging Review Dg Chest 2 View  10/16/2014   CLINICAL DATA:  LEFT upper back pain with difficulty breathing for 2 days. Smoker.  EXAM: CHEST  2 VIEW  COMPARISON:  Chest radiograph December 29, 2013  FINDINGS: The heart size and mediastinal contours are within normal limits. Both lungs are clear. Mild bronchitic changes. Mild degenerative change of thoracic spine.  IMPRESSION: Mild bronchitic changes without focal consolidation.   Electronically Signed   By: Awilda Metroourtnay  Bloomer   On: 10/16/2014 20:15     EKG Interpretation None      MDM   Final diagnoses:  Mid back pain on left side  Chest pain varies with breathing  Finger pain, right    Pt is a 28yo female c/o mid-left sided back pain and unrelated right ring finger pain. No hx of injury or recent illness. Lungs: CTAB. Pt is tender to palpation on left mid-back.  Pt states feels different than renal stones but does have hx of stones.  UA: many bacteria, however, appears contaminated with squamous cells, otherwise unremarkable. CXR: mild bronchitis changes however no c/o cough or congestion, will tx conservatively for possible early onset of viral illness. Not concerned for PE. PERC negative.  Right finger pain: no hx of injury, no evidence of infection. Will tx as musculoskeletal pain. Rx: acetaminophen  and ibuprofen. Advised to f/u with PCP in 3-4 days for recheck of symptoms if not improving. Pt verbalized understanding and agreement with tx plan.  I personally performed the services described in this documentation, which was scribed in my presence. The recorded information has been reviewed and is accurate.    Junius Finnerrin O'Malley, PA-C 10/16/14 2220  Elwin MochaBlair Walden, MD 10/16/14 [phone]

## 2015-02-14 ENCOUNTER — Encounter (HOSPITAL_COMMUNITY): Payer: Self-pay | Admitting: Emergency Medicine

## 2015-02-14 ENCOUNTER — Emergency Department (HOSPITAL_COMMUNITY)
Admission: EM | Admit: 2015-02-14 | Discharge: 2015-02-14 | Disposition: A | Discharge status: Home / Self Care | Payer: Medicaid Other | Attending: Emergency Medicine | Admitting: Emergency Medicine

## 2015-02-14 DIAGNOSIS — Z21 Asymptomatic human immunodeficiency virus [HIV] infection status: Secondary | ICD-10-CM | POA: Insufficient documentation

## 2015-02-14 DIAGNOSIS — Z720 Tobacco use: Secondary | ICD-10-CM | POA: Insufficient documentation

## 2015-02-14 DIAGNOSIS — Z8619 Personal history of other infectious and parasitic diseases: Secondary | ICD-10-CM | POA: Insufficient documentation

## 2015-02-14 DIAGNOSIS — K002 Abnormalities of size and form of teeth: Secondary | ICD-10-CM | POA: Insufficient documentation

## 2015-02-14 DIAGNOSIS — J45909 Unspecified asthma, uncomplicated: Secondary | ICD-10-CM | POA: Insufficient documentation

## 2015-02-14 DIAGNOSIS — K029 Dental caries, unspecified: Secondary | ICD-10-CM | POA: Insufficient documentation

## 2015-02-14 DIAGNOSIS — E669 Obesity, unspecified: Secondary | ICD-10-CM | POA: Insufficient documentation

## 2015-02-14 DIAGNOSIS — Z8659 Personal history of other mental and behavioral disorders: Secondary | ICD-10-CM | POA: Insufficient documentation

## 2015-02-14 DIAGNOSIS — K047 Periapical abscess without sinus: Secondary | ICD-10-CM | POA: Insufficient documentation

## 2015-02-14 MED ORDER — PENICILLIN V POTASSIUM 500 MG PO TABS: 500.0000 mg | ORAL_TABLET | Freq: Three times a day (TID) | ORAL | Status: DC

## 2015-02-14 MED ORDER — TRAMADOL HCL 50 MG PO TABS: 50.0000 mg | ORAL_TABLET | Freq: Four times a day (QID) | ORAL | Status: DC | PRN

## 2015-02-14 MED ORDER — PENICILLIN V POTASSIUM 250 MG PO TABS
500.0000 mg | ORAL_TABLET | Freq: Once | ORAL | Status: AC
  Administered 2015-02-14: 500 mg via ORAL
  Filled 2015-02-14: qty 2

## 2015-02-14 MED ORDER — TRAMADOL HCL 50 MG PO TABS
50.0000 mg | ORAL_TABLET | Freq: Once | ORAL | Status: AC
  Administered 2015-02-14: 50 mg via ORAL
  Filled 2015-02-14: qty 1

## 2015-02-14 NOTE — ED Provider Notes (Signed)
CSN: [id number]     Arrival date & time 02/14/15  2034 History   First MD Initiated Contact with Patient 02/14/15 2108     Chief Complaint  Patient presents with  . Dental Pain     (Consider location/radiation/quality/duration/timing/severity/associated sxs/prior Treatment) HPI Comments: Patient with history of HIV, hepatitis C presents with complaint of left lower dental pain for 2 days. Patient has pain that radiates to her left face. She denies swelling. No difficulty breathing or swallowing. Patient states that it hurts to chew on that side of her mouth. She has a dental appointment in one month. She's been taking Tylenol at home without relief. Onset of symptoms acute. Course is gradually worsening. Nothing makes symptoms better or worse.  Patient is a 29 y.o. female presenting with tooth pain. The history is provided by the patient.  Dental Pain Associated symptoms: no facial swelling, no fever, no headaches and no neck pain     Past Medical History  Diagnosis Date  . Bipolar 1 disorder   . Schizophrenia   . Hepatitis B   . HIV (human immunodeficiency virus infection)   . Asthma   . Drug abuse   . Obese    Past Surgical History  Procedure Laterality Date  . Tympanostomy tube placement    . Hernia repair    . Tubal ligation     Family History  Problem Relation Age of Onset  . Hypertension     History  Substance Use Topics  . Smoking status: Current Every Day Smoker    Last Attempt to Quit: 11/26/2007  . Smokeless tobacco: Not on file  . Alcohol Use: Yes     Comment: occ   OB History    No data available     Review of Systems  Constitutional: Negative for fever.  HENT: Positive for dental problem. Negative for ear pain, facial swelling, sore throat and trouble swallowing.   Respiratory: Negative for shortness of breath and stridor.   Musculoskeletal: Negative for neck pain.  Skin: Negative for color change.  Neurological: Negative for headaches.       Allergies  Aspirin and Norco  Home Medications   Prior to Admission medications   Medication Sig Start Date End Date Taking? Authorizing Provider  acetaminophen (TYLENOL) 500 MG tablet Take 1,500 mg by mouth every 6 (six) hours as needed (for pain.).     Historical Provider, MD  ibuprofen (ADVIL,MOTRIN) 800 MG tablet Take 1 tablet (800 mg total) by mouth every 8 (eight) hours as needed. 08/24/14   Fayrene Helper, PA-C  penicillin v potassium (VEETID) 500 MG tablet Take 1 tablet (500 mg total) by mouth 3 (three) times daily. 02/14/15   Renne Crigler, PA-C  traMADol (ULTRAM) 50 MG tablet Take 1 tablet (50 mg total) by mouth every 6 (six) hours as needed. 02/14/15   Renne Crigler, PA-C   BP 120/71 mmHg  Pulse 88  Temp(Src) 98.7 F (37.1 C) (Oral)  Resp 18  Ht  (1.702 m)  Wt 230 lb (104.327 kg)  BMI 36.01 kg/m2  SpO2 98%  LMP 02/10/2015 (Exact Date)  Physical Exam  Constitutional: She appears well-developed and well-nourished.  HENT:  Head: Normocephalic and atraumatic.  Right Ear: Tympanic membrane, external ear and ear canal normal.  Left Ear: Tympanic membrane, external ear and ear canal normal.  Nose: Nose normal.  Mouth/Throat: Uvula is midline, oropharynx is clear and moist and mucous membranes are normal. No trismus in the jaw. Abnormal dentition. Dental  caries present. No dental abscesses or uvula swelling. No tonsillar abscesses.  Erythema and tenderness along the gumline, left mandibular jaw. Dentition is in poor repair. There is a small pustule noted on the left gums. No palpable or gross abscess.  Eyes: Conjunctivae are normal.  Neck: Normal range of motion. Neck supple.  No neck swelling or Ludwig's angina  Lymphadenopathy:    She has no cervical adenopathy.  Neurological: She is alert.  Skin: Skin is warm and dry.  Psychiatric: She has a normal mood and affect.  Nursing note and vitals reviewed.   ED Course  Procedures (including critical care time) Labs  Review Labs Reviewed - No data to display  Imaging Review No results found.   EKG Interpretation None      9:41 PM Patient seen and examined. Medications ordered.   Vital signs reviewed and are as follows: BP 120/71 mmHg  Pulse 88  Temp(Src) 98.7 F (37.1 C) (Oral)  Resp 18  Ht  (1.702 m)  Wt 230 lb (104.327 kg)  BMI 36.01 kg/m2  SpO2 98%  LMP 02/10/2015 (Exact Date)  Patient counseled on use of narcotic pain medications. Counseled not to combine these medications with others containing tylenol. Urged not to drink alcohol, drive, or perform any other activities that requires focus while taking these medications. The patient verbalizes understanding and agrees with the plan.  Patient counseled to take prescribed medications as directed, return with worsening facial or neck swelling, and to follow-up with their dentist as soon as possible.    MDM   Final diagnoses:  Dental infection   Patient with toothache. No fever. Exam unconcerning for Ludwig's angina or other deep tissue infection in neck.   As there is gum swelling, erythema, will treat with antibiotic and pain medicine. Urged patient to follow-up with dentist.       Renne Crigler, PA-C 02/14/15 1610  Tilden Fossa, MD 02/14/15 [phone]

## 2015-02-14 NOTE — ED Notes (Signed)
Patient here with lower left dental pain. States unable to get to dentist until next month. Has been taking OTC medications without effect. Face appears mildly swollen, no fevers.

## 2015-02-14 NOTE — Discharge Instructions (Signed)
Please read and follow all provided instructions.  Your diagnoses today include:  1. Dental infection    The exam and treatment you received today has been provided on an emergency basis only. This is not a substitute for complete medical or dental care.  Tests performed today include:  Vital signs. See below for your results today.   Medications prescribed:   Penicillin - antibiotic  You have been prescribed an antibiotic medicine: take the entire course of medicine even if you are feeling better. Stopping early can cause the antibiotic not to work.   Tramadol - narcotic-like pain medication  DO NOT drive or perform any activities that require you to be awake and alert because this medicine can make you drowsy.   Take any prescribed medications only as directed.  Home care instructions:  Follow any educational materials contained in this packet.  Follow-up instructions: Please follow-up with your dentist for further evaluation of your symptoms.   Dental Assistance: See below for dental referrals  Return instructions:   Please return to the Emergency Department if you experience worsening symptoms.  Please return if you develop a fever, you develop more swelling in your face or neck, you have trouble breathing or swallowing food.  Please return if you have any other emergent concerns.  Additional Information:  Your vital signs today were: BP 120/71 mmHg   Pulse 88   Temp(Src) 98.7 F (37.1 C) (Oral)   Resp 18   Ht  (1.702 m)   Wt 230 lb (104.327 kg)   BMI 36.01 kg/m2   SpO2 98%   LMP 02/10/2015 (Exact Date) If your blood pressure (BP) was elevated above 135/85 this visit, please have this repeated by your doctor within one month. -------------- Dental Care: Organization         Address  Phone  Notes  [facility] Department of [facility] [address] [place], [place] [phone] Accepts children up to age 7 who are enrolled  in [place] or [place] Health Choice; pregnant women with a Medicaid card; and children who have applied for Medicaid or [place] Health Choice, but were declined, whose parents can pay a reduced fee at time of service.  [facility] Department of [facility]  [address], [place] [phone] Accepts children up to age 43 who are enrolled in [place] or [place] Health Choice; pregnant women with a Medicaid card; and children who have applied for Medicaid or [place] Health Choice, but were declined, whose parents can pay a reduced fee at time of service.  [facility]  [address] [place], [place] [phone] Patients are seen by appointment only. Walk-ins are not accepted. [facility] will see patients 43 years of age and older. Monday - Tuesday (8am-5pm) Most Wednesdays (8:30-5pm) $30 per visit, cash only  [facility]  [address], [facility] [phone] Patients are seen by appointment only. Walk-ins are not accepted. [facility] will see patients 69 years of age and older. One Wednesday Evening (Monthly: Volunteer Based).  $30 per visit, cash only  [employer] [facility] [employer]  [phone] for adults; Children under age 73, call Graduate Pediatric Dentistry at [phone]. Children aged 42-14, please call [phone] to request a pediatric application.  Dental services are provided in all areas of dental care including fillings, crowns and bridges, complete and partial dentures, implants, gum treatment, root canals, and  extractions. Preventive care is also provided. Treatment is provided to both adults and children. Patients are selected via a lottery and there is often a waiting list.   [facility] [address], [place]  [phone] [email or web address]   [facility] [address] [place], [place] [phone], Ext. [phone] Second and Fourth Thursday of each month, opens at  6:30 AM; Clinic ends at 9 AM.  Patients are seen on a first-come first-served basis, and a limited number are seen during each clinic.   [facility]  [address] [place], [place] [phone]   Eligibility Requirements You must have lived in [place], [place], or [place] counties for at least the last three months.   You cannot be eligible for state or federal sponsored [place], including [employer], [place], or [employer].   You generally cannot be eligible for healthcare insurance through your employer.    How to apply: Eligibility screenings are held every Tuesday and Wednesday afternoon from 1:00 pm until 4:00 pm. You do not need an appointment for the interview!  [facility] [address], [place], [place] [phone]   [facility] Health Department  [phone]   [facility] Health Department  [phone]   [facility] Health Department  [phone]

## 2015-03-07 ENCOUNTER — Emergency Department (HOSPITAL_COMMUNITY)
Admission: EM | Admit: 2015-03-07 | Discharge: 2015-03-07 | Disposition: A | Discharge status: Home / Self Care | Payer: Medicaid Other | Source: Home / Self Care

## 2015-04-26 ENCOUNTER — Encounter (HOSPITAL_COMMUNITY): Payer: Self-pay | Admitting: Emergency Medicine

## 2015-04-26 ENCOUNTER — Emergency Department (HOSPITAL_COMMUNITY)
Admission: EM | Admit: 2015-04-26 | Discharge: 2015-04-26 | Disposition: A | Discharge status: Home / Self Care | Payer: Medicaid Other | Attending: Emergency Medicine | Admitting: Emergency Medicine

## 2015-04-26 DIAGNOSIS — Z21 Asymptomatic human immunodeficiency virus [HIV] infection status: Secondary | ICD-10-CM | POA: Diagnosis not present

## 2015-04-26 DIAGNOSIS — E669 Obesity, unspecified: Secondary | ICD-10-CM | POA: Insufficient documentation

## 2015-04-26 DIAGNOSIS — Z792 Long term (current) use of antibiotics: Secondary | ICD-10-CM | POA: Diagnosis not present

## 2015-04-26 DIAGNOSIS — K088 Other specified disorders of teeth and supporting structures: Secondary | ICD-10-CM | POA: Insufficient documentation

## 2015-04-26 DIAGNOSIS — K0889 Other specified disorders of teeth and supporting structures: Secondary | ICD-10-CM

## 2015-04-26 DIAGNOSIS — J45909 Unspecified asthma, uncomplicated: Secondary | ICD-10-CM | POA: Insufficient documentation

## 2015-04-26 DIAGNOSIS — K029 Dental caries, unspecified: Secondary | ICD-10-CM | POA: Diagnosis not present

## 2015-04-26 DIAGNOSIS — Z720 Tobacco use: Secondary | ICD-10-CM | POA: Diagnosis not present

## 2015-04-26 DIAGNOSIS — Z8659 Personal history of other mental and behavioral disorders: Secondary | ICD-10-CM | POA: Diagnosis not present

## 2015-04-26 DIAGNOSIS — Z8619 Personal history of other infectious and parasitic diseases: Secondary | ICD-10-CM | POA: Insufficient documentation

## 2015-04-26 MED ORDER — CLINDAMYCIN HCL 300 MG PO CAPS: 300.0000 mg | ORAL_CAPSULE | Freq: Four times a day (QID) | ORAL | Status: DC

## 2015-04-26 NOTE — ED Provider Notes (Signed)
CSN: [id number]     Arrival date & time 04/26/15  0950 History  This chart was scribed for Trisha Mangle, PA-C, working with Gerhard Munch, MD by Elon Spanner, ED Scribe. This patient was seen in room TR11C/TR11C and the patient's care was started at 10:23 AM.  Chief Complaint  Patient presents with  . Dental Pain   The history is provided by the patient. No language interpreter was used.   HPI Comments: Penny Young is a 29 y.o. female who presents to the Emergency Department complaining of ongoing left lower dental pain with worsening last night after chipping a tooth while eating.  She has been seen for similar complaints previously and prescribed antibiotics frequently with minimal, transient improvement.  She has not been seen by a dentist for financial reasons. She has used salt water rinses and mouthwash.   Past Medical History  Diagnosis Date  . Bipolar 1 disorder   . Schizophrenia   . Hepatitis B   . HIV (human immunodeficiency virus infection)   . Asthma   . Drug abuse   . Obese    Past Surgical History  Procedure Laterality Date  . Tympanostomy tube placement    . Hernia repair    . Tubal ligation     Family History  Problem Relation Age of Onset  . Hypertension     History  Substance Use Topics  . Smoking status: Current Every Day Smoker -- 1.50 packs/day    Types: Cigarettes    Last Attempt to Quit: 11/26/2007  . Smokeless tobacco: Not on file  . Alcohol Use: Yes     Comment: occ   OB History    No data available     Review of Systems  HENT: Positive for dental problem.     Allergies  Aspirin and Norco  Home Medications   Prior to Admission medications   Medication Sig Start Date End Date Taking? Authorizing Provider  acetaminophen (TYLENOL) 500 MG tablet Take 1,500 mg by mouth every 6 (six) hours as needed (for pain.).     Historical Provider, MD  ibuprofen (ADVIL,MOTRIN) 800 MG tablet Take 1 tablet (800 mg total) by mouth every 8 (eight) hours as  needed. 08/24/14   Fayrene Helper, PA-C  penicillin v potassium (VEETID) 500 MG tablet Take 1 tablet (500 mg total) by mouth 3 (three) times daily. 02/14/15   Renne Crigler, PA-C  traMADol (ULTRAM) 50 MG tablet Take 1 tablet (50 mg total) by mouth every 6 (six) hours as needed. 02/14/15   Renne Crigler, PA-C   BP 138/87 mmHg  Pulse 80  Temp(Src) 98.2 F (36.8 C) (Oral)  Resp 20  SpO2 100% Physical Exam  Constitutional: She is oriented to person, place, and time. She appears well-developed and well-nourished. No distress.  HENT:  Head: Normocephalic and atraumatic.  Dental decay and filling around left lower third molar with bulging gum line.   Eyes: Conjunctivae and EOM are normal.  Neck: Neck supple. No tracheal deviation present.  Cardiovascular: Normal rate.   Pulmonary/Chest: Effort normal. No respiratory distress.  Musculoskeletal: Normal range of motion.  Neurological: She is alert and oriented to person, place, and time.  Skin: Skin is warm and dry.  Psychiatric: She has a normal mood and affect. Her behavior is normal.  Nursing note and vitals reviewed.   ED Course  Procedures (including critical care time) DIAGNOSTIC STUDIES: Oxygen Saturation is 00% on RA, normal by my interpretation.    COORDINATION OF CARE:  10:26 AM Will prescribe antibiotic and refer to dentist.  Patient acknowledges and agrees with plan.    Labs Review Labs Reviewed - No data to display  Imaging Review No results found.   EKG Interpretation None      MDM   Final diagnoses:  Toothache    Clindamycin Schedule dental evaluation   Elson Areas, PA-C 04/26/15 1108  Gerhard Munch, MD 04/27/15 1610

## 2015-04-26 NOTE — ED Notes (Signed)
Pain in left lower teeth.  Patient states she chipped a tooth some time ago and chipped a tooth.   Patient states she does have a dentist, but didn't call for an appointment.

## 2015-04-26 NOTE — Discharge Instructions (Signed)

## 2015-05-02 ENCOUNTER — Emergency Department (HOSPITAL_COMMUNITY)
Admission: EM | Admit: 2015-05-02 | Discharge: 2015-05-02 | Disposition: A | Discharge status: Home / Self Care | Payer: Medicaid Other | Attending: Emergency Medicine | Admitting: Emergency Medicine

## 2015-05-02 ENCOUNTER — Encounter (HOSPITAL_COMMUNITY): Payer: Self-pay | Admitting: Emergency Medicine

## 2015-05-02 DIAGNOSIS — S299XXA Unspecified injury of thorax, initial encounter: Secondary | ICD-10-CM | POA: Diagnosis not present

## 2015-05-02 DIAGNOSIS — Y998 Other external cause status: Secondary | ICD-10-CM | POA: Insufficient documentation

## 2015-05-02 DIAGNOSIS — Z720 Tobacco use: Secondary | ICD-10-CM | POA: Insufficient documentation

## 2015-05-02 DIAGNOSIS — Z8619 Personal history of other infectious and parasitic diseases: Secondary | ICD-10-CM | POA: Insufficient documentation

## 2015-05-02 DIAGNOSIS — E669 Obesity, unspecified: Secondary | ICD-10-CM | POA: Diagnosis not present

## 2015-05-02 DIAGNOSIS — Y92410 Unspecified street and highway as the place of occurrence of the external cause: Secondary | ICD-10-CM | POA: Insufficient documentation

## 2015-05-02 DIAGNOSIS — Z792 Long term (current) use of antibiotics: Secondary | ICD-10-CM | POA: Insufficient documentation

## 2015-05-02 DIAGNOSIS — M62838 Other muscle spasm: Secondary | ICD-10-CM | POA: Diagnosis not present

## 2015-05-02 DIAGNOSIS — S4992XA Unspecified injury of left shoulder and upper arm, initial encounter: Secondary | ICD-10-CM | POA: Insufficient documentation

## 2015-05-02 DIAGNOSIS — J45909 Unspecified asthma, uncomplicated: Secondary | ICD-10-CM | POA: Diagnosis not present

## 2015-05-02 DIAGNOSIS — F319 Bipolar disorder, unspecified: Secondary | ICD-10-CM | POA: Diagnosis not present

## 2015-05-02 DIAGNOSIS — Z21 Asymptomatic human immunodeficiency virus [HIV] infection status: Secondary | ICD-10-CM | POA: Insufficient documentation

## 2015-05-02 DIAGNOSIS — Z79899 Other long term (current) drug therapy: Secondary | ICD-10-CM | POA: Insufficient documentation

## 2015-05-02 DIAGNOSIS — Y9389 Activity, other specified: Secondary | ICD-10-CM | POA: Insufficient documentation

## 2015-05-02 MED ORDER — IBUPROFEN 600 MG PO TABS: 600.0000 mg | ORAL_TABLET | Freq: Four times a day (QID) | ORAL | Status: DC | PRN

## 2015-05-02 MED ORDER — METHOCARBAMOL 500 MG PO TABS: 500.0000 mg | ORAL_TABLET | Freq: Two times a day (BID) | ORAL | Status: DC
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
## 2015-05-02 NOTE — ED Notes (Signed)
Upon pt's arrival, pt was noted to be sitting in a triage room, yelling and screaming about how "fucking ridiculous it was that she was in a wheelchair and there were no pillows and blankets provided to her on arrival."  Pt was not redirectable.  Pt was taken to triage 6 and placed in a stretcher chair and given a pillow.  Upon arrival of pt's family members into the room, pt began screaming at the top of her lungs and crying about how much pain she was in.  Unable to do vital signs on arrival due to patient's behavior.  Pt was encouraged that her airway was intact, she was breathing, and she was mentating.  Pt yelled "I'm mentating because I'm in fucking pain, lady!  I had 2 kids, you can't tell me about my pain!"  Family members seemed to be escalating the situation.  Family members were asked to leave the room.

## 2015-05-03 ENCOUNTER — Encounter (HOSPITAL_COMMUNITY): Payer: Self-pay

## 2015-05-03 ENCOUNTER — Emergency Department (HOSPITAL_COMMUNITY): Payer: Medicaid Other

## 2015-05-03 ENCOUNTER — Emergency Department (HOSPITAL_COMMUNITY)
Admission: EM | Admit: 2015-05-03 | Discharge: 2015-05-03 | Disposition: A | Discharge status: Home / Self Care | Payer: Medicaid Other | Attending: Emergency Medicine | Admitting: Emergency Medicine

## 2015-05-03 DIAGNOSIS — Y9389 Activity, other specified: Secondary | ICD-10-CM | POA: Insufficient documentation

## 2015-05-03 DIAGNOSIS — Y92410 Unspecified street and highway as the place of occurrence of the external cause: Secondary | ICD-10-CM | POA: Diagnosis not present

## 2015-05-03 DIAGNOSIS — S161XXA Strain of muscle, fascia and tendon at neck level, initial encounter: Secondary | ICD-10-CM

## 2015-05-03 DIAGNOSIS — Z8659 Personal history of other mental and behavioral disorders: Secondary | ICD-10-CM | POA: Insufficient documentation

## 2015-05-03 DIAGNOSIS — Z720 Tobacco use: Secondary | ICD-10-CM | POA: Diagnosis not present

## 2015-05-03 DIAGNOSIS — J45909 Unspecified asthma, uncomplicated: Secondary | ICD-10-CM | POA: Diagnosis not present

## 2015-05-03 DIAGNOSIS — E669 Obesity, unspecified: Secondary | ICD-10-CM | POA: Insufficient documentation

## 2015-05-03 DIAGNOSIS — Z792 Long term (current) use of antibiotics: Secondary | ICD-10-CM | POA: Diagnosis not present

## 2015-05-03 DIAGNOSIS — S199XXA Unspecified injury of neck, initial encounter: Secondary | ICD-10-CM | POA: Diagnosis present

## 2015-05-03 DIAGNOSIS — Z21 Asymptomatic human immunodeficiency virus [HIV] infection status: Secondary | ICD-10-CM | POA: Diagnosis not present

## 2015-05-03 DIAGNOSIS — Y998 Other external cause status: Secondary | ICD-10-CM | POA: Diagnosis not present

## 2015-05-03 DIAGNOSIS — S43402A Unspecified sprain of left shoulder joint, initial encounter: Secondary | ICD-10-CM | POA: Insufficient documentation

## 2015-05-03 DIAGNOSIS — Z8619 Personal history of other infectious and parasitic diseases: Secondary | ICD-10-CM | POA: Diagnosis not present

## 2015-05-03 MED ORDER — IBUPROFEN 400 MG PO TABS
800.0000 mg | ORAL_TABLET | Freq: Once | ORAL | Status: AC
  Administered 2015-05-03: 800 mg via ORAL
  Filled 2015-05-03: qty 2
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
## 2015-05-03 NOTE — ED Notes (Signed)
Pt A&OX4, ambulatory at d/c with steady gait, NAD 

## 2015-05-03 NOTE — Discharge Instructions (Signed)
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 [phone]   [facility]/Dr. Osei-Bonsu  [address] [place] or [phone]  Dr, Laurell Josephs [address], [place] [phone] Phone number for both [facility] and [place] locations is the same.  [facility] [address], [place] [phone]   [facility] [address], [place] or [address] [phone]   [facility] [address], [place] [phone], phone; [phone], fax Sees patients 1st and 3rd Saturday of every month.  Must not qualify for public  or private insurance (i.e. Medicaid, Medicare, [place] Health Choice, Veterans' Benefits)  Household income should be no more than 200% of the poverty level The clinic cannot treat you if you are pregnant or think you are pregnant  Sexually transmitted diseases are not treated at the clinic.    Dental Care: Organization         Address  Phone  Notes  [facility] Department of [facility] [address] [place], [place] [phone] Accepts children up to age 52 who are enrolled in [place] or [place] Health Choice; pregnant women with a Medicaid card; and children who have applied for Medicaid or [place] Health Choice, but were declined, whose parents can pay a reduced fee at time of service.  [facility] Department of [facility]  [address], [place] [phone] Accepts children up to age 84 who are enrolled in [place] or [place] Health Choice; pregnant women with a Medicaid card; and children who have applied for Medicaid or [place] Health Choice, but were declined, whose parents can pay a reduced fee at time of service.  [facility]  [address] [place], [place] [phone] Patients are seen by appointment only. Walk-ins are not accepted. [facility] will see patients 41 years of age and older. Monday - Tuesday (8am-5pm) Most Wednesdays (8:30-5pm) $30 per visit, cash only  [facility]  [address], [facility] [phone] Patients are seen by appointment only. Walk-ins are not accepted. [facility] will see patients 80 years of age and older. One Wednesday Evening (Monthly: Volunteer Based).  $30 per visit, cash only  [employer]  [phone] for adults; Children under age 46, call Graduate Pediatric Dentistry at [phone]. Children aged 33-14, please call [phone] to request a pediatric application.  Dental services are provided in all areas of  dental care including fillings, crowns and bridges, complete and partial dentures, implants, gum treatment, root canals, and extractions. Preventive care is also provided. Treatment is provided to both adults and children. Patients are selected via a lottery and there is often a waiting list.   [facility] [address], [place]  [phone] [email or web address]   [facility] [address] [place], [place] [phone], Ext. [phone] Second and Fourth Thursday of each month, opens at 6:30 AM; Clinic ends at 9 AM.  Patients are seen on a first-come first-served basis, and a limited number are seen during each clinic.   [facility]  [address] Ether Griffins [place], [place] [phone]   Eligibility Requirements You must have lived in [place], [place], or [place] counties for at least the last three months.   You cannot be eligible for state or federal sponsored [place], including [employer], [place], or [employer].   You generally cannot be eligible for healthcare insurance through your employer.    How to apply: Eligibility screenings are held  every Tuesday and Wednesday afternoon from 1:00 pm until 4:00 pm. You do not need an appointment for the interview!  [facility] [address], [place], [place] [phone]   [facility] Health Department  [phone]   [facility] Health Department  [phone]   [facility] Health Department  [phone]    Behavioral Health Resources in the Community: Intensive Outpatient Programs Organization         Address  Phone  Notes  [facility] Services [address] [place], [place] [phone]   [facility] Outpatient [address], [place], [place] [phone]   [facility] [address], [place], [place]  [phone]   [facility] [address]  [place], [place] [phone] or  [phone]   Substance Abuse Resources Organization         Address  Phone  Notes  [facility] Drug Services  [phone]   [facility]  [phone]   [facility]  [phone]   Floydene Flock  [phone]   Residential & Outpatient Substance Abuse Program  [phone]   Psychological Services Organization         Address  Phone  Notes  [facility] Behavioral Health  [phone]   [facility] Services  [phone]   [facility] Mental Health [address] [place] [phone] or [phone]    Mobile Crisis Teams Organization         Address  Phone  Notes  [facility], Mobile Crisis Care Unit  [phone]   [facility]  [address] [place], [place] [phone]   Doristine Locks [address] [place] [phone]    Self-Help/Support Groups Organization         Address  Phone             Notes  [facility] [place] - variety of support groups  [phone]- [phone] Call for more information  Narcotics Anonymous (NA), [facility] [address], [employer] [place]  2 meetings at this location   Statistician         Address  Phone  Notes  [facility] [address],    [place]  [phone]   [facility]  [address], [place] [address], [place], [place] [phone]   [facility] [address] [place], [place] [phone] Admissions: 8am-3pm M-F  [facility] [address],    [place], [place] [phone]   [facility] [address] [place], [place], [place] [phone]   [facility] [address]  [place], [place] [phone]   [facility] - Intensive Outpatient [address] Laure[address] Josephs [address], [place], [place] [phone]   [facility] ([facility] [address] [place] [place]  [place], [place] [phone] or [phone]   [facility]idential  Treatment Services ([facility]) [address], [place], [place] [phone] Accepts Medicaid  [facility] [place] [address],  [place] [phone] Substance Abuse/Addiction Treatment   [facility] Organization         Address  Phone  Notes  [facility]  [phone]   Angie Fava, PhD [address] Ervin Knack [place], [place]   [phone] or [phone]   [facility]al   [address] [place], [place] [phone]   [facility] [address] [place], [place] [phone] Insurance/Medicaid/sponsorship through  [facility] [address] [place], [place] [phone] [place], [place] [phone]    Dr. Adele Schilder  [phone]   [facility] Dept. 1) [address] [place] 2) [place] 3)  [address], [place] [phone]  [phone]   [place] [phone] or [phone] (After Hours)

## 2015-05-03 NOTE — ED Notes (Signed)
Pt reports she took 2 muscle relaxers about 5 hours ago.

## 2015-06-27 ENCOUNTER — Emergency Department (HOSPITAL_COMMUNITY)
Admission: EM | Admit: 2015-06-27 | Discharge: 2015-06-27 | Disposition: A | Discharge status: Home / Self Care | Payer: Medicaid Other | Attending: Emergency Medicine | Admitting: Emergency Medicine

## 2015-06-27 ENCOUNTER — Encounter (HOSPITAL_COMMUNITY): Payer: Self-pay | Admitting: Emergency Medicine

## 2015-06-27 ENCOUNTER — Emergency Department (HOSPITAL_BASED_OUTPATIENT_CLINIC_OR_DEPARTMENT_OTHER): Payer: Medicaid Other

## 2015-06-27 ENCOUNTER — Emergency Department (HOSPITAL_COMMUNITY): Payer: Medicaid Other

## 2015-06-27 DIAGNOSIS — Z720 Tobacco use: Secondary | ICD-10-CM | POA: Diagnosis not present

## 2015-06-27 DIAGNOSIS — M79609 Pain in unspecified limb: Secondary | ICD-10-CM | POA: Diagnosis not present

## 2015-06-27 DIAGNOSIS — M79604 Pain in right leg: Secondary | ICD-10-CM | POA: Diagnosis present

## 2015-06-27 DIAGNOSIS — J45909 Unspecified asthma, uncomplicated: Secondary | ICD-10-CM | POA: Insufficient documentation

## 2015-06-27 DIAGNOSIS — M79661 Pain in right lower leg: Secondary | ICD-10-CM | POA: Diagnosis not present

## 2015-06-27 DIAGNOSIS — Z8659 Personal history of other mental and behavioral disorders: Secondary | ICD-10-CM | POA: Diagnosis not present

## 2015-06-27 DIAGNOSIS — Z8619 Personal history of other infectious and parasitic diseases: Secondary | ICD-10-CM | POA: Insufficient documentation

## 2015-06-27 DIAGNOSIS — Z21 Asymptomatic human immunodeficiency virus [HIV] infection status: Secondary | ICD-10-CM | POA: Insufficient documentation

## 2015-06-27 DIAGNOSIS — Z79899 Other long term (current) drug therapy: Secondary | ICD-10-CM | POA: Insufficient documentation

## 2015-06-27 DIAGNOSIS — Z792 Long term (current) use of antibiotics: Secondary | ICD-10-CM | POA: Insufficient documentation

## 2015-06-27 DIAGNOSIS — R079 Chest pain, unspecified: Secondary | ICD-10-CM | POA: Diagnosis not present

## 2015-06-27 DIAGNOSIS — E669 Obesity, unspecified: Secondary | ICD-10-CM | POA: Insufficient documentation

## 2015-06-27 LAB — CBC WITH DIFFERENTIAL/PLATELET
Basophils Absolute: 0 10*3/uL (ref 0.0–0.1)
Basophils Relative: 0 % (ref 0–1)
Eosinophils Absolute: 0.1 10*3/uL (ref 0.0–0.7)
Eosinophils Relative: 1 % (ref 0–5)
HCT: 40.1 % (ref 36.0–46.0)
HEMOGLOBIN: 13.6 g/dL (ref 12.0–15.0)
LYMPHS ABS: 2.4 10*3/uL (ref 0.7–4.0)
Lymphocytes Relative: 50 % — ABNORMAL HIGH (ref 12–46)
MCH: 28 pg (ref 26.0–34.0)
MCHC: 33.9 g/dL (ref 30.0–36.0)
MCV: 82.5 fL (ref 78.0–100.0)
MONOS PCT: 7 % (ref 3–12)
Monocytes Absolute: 0.4 10*3/uL (ref 0.1–1.0)
Neutro Abs: 2.1 10*3/uL (ref 1.7–7.7)
Neutrophils Relative %: 42 % — ABNORMAL LOW (ref 43–77)
Platelets: 282 10*3/uL (ref 150–400)
RBC: 4.86 MIL/uL (ref 3.87–5.11)
RDW: 14 % (ref 11.5–15.5)
WBC: 4.9 10*3/uL (ref 4.0–10.5)

## 2015-06-27 LAB — I-STAT TROPONIN, ED: Troponin i, poc: 0 ng/mL (ref 0.00–0.08)

## 2015-06-27 LAB — BASIC METABOLIC PANEL
ANION GAP: 6 (ref 5–15)
BUN: 11 mg/dL (ref 6–20)
CO2: 25 mmol/L (ref 22–32)
CREATININE: 0.71 mg/dL (ref 0.44–1.00)
Calcium: 9.1 mg/dL (ref 8.9–10.3)
Chloride: 105 mmol/L (ref 101–111)
GFR calc Af Amer: >60 mL/min (ref >60)
GFR calc non Af Amer: >60 mL/min (ref >60)
GLUCOSE: 95 mg/dL (ref 65–99)
POTASSIUM: 4.1 mmol/L (ref 3.5–5.1)
Sodium: 136 mmol/L (ref 135–145)

## 2015-06-27 MED ORDER — NAPROXEN 250 MG PO TABS
500.0000 mg | ORAL_TABLET | Freq: Once | ORAL | Status: AC
  Administered 2015-06-27: 500 mg via ORAL
  Filled 2015-06-27: qty 2

## 2015-06-27 MED ORDER — NAPROXEN 500 MG PO TABS: 500.0000 mg | ORAL_TABLET | Freq: Two times a day (BID) | ORAL | Status: DC

## 2015-06-27 NOTE — ED Provider Notes (Signed)
CSN: [id number]     Arrival date & time 06/27/15  1433 History   First MD Initiated Contact with Patient 06/27/15 1636     Chief Complaint  Patient presents with  . Chest Pain  . Leg Pain     (Consider location/radiation/quality/duration/timing/severity/associated sxs/prior Treatment) The history is provided by the patient and medical records.    This is a 29 year old female with history of bipolar disorder, schizophrenia, hepatitis, HIV, asthma, drug abuse, presenting to the ED for leg pain. Triage note reports chest pain, however patient denies this to me. She states for the past 2 days she has had aching pains in her bilateral calf, significantly worse in her right leg. States pain seems worse at night. She initially thought this was due to a "charley horse" so she tried stretching out her leg without relief. She denies numbness or weakness of her lower extremities. She denies any back pain, fever, or chills. Patient denies any recent travel, trauma, surgery, prolonged immobilization, or exogenous estrogen use. No history of DVT or PE.  VSS.  Past Medical History  Diagnosis Date  . Bipolar 1 disorder   . Schizophrenia   . Hepatitis B   . HIV (human immunodeficiency virus infection)   . Asthma   . Drug abuse   . Obese    Past Surgical History  Procedure Laterality Date  . Tympanostomy tube placement    . Hernia repair    . Tubal ligation     Family History  Problem Relation Age of Onset  . Hypertension     History  Substance Use Topics  . Smoking status: Current Every Day Smoker -- 1.50 packs/day    Types: Cigarettes    Last Attempt to Quit: 11/26/2007  . Smokeless tobacco: Not on file  . Alcohol Use: Yes     Comment: occ   OB History    No data available     Review of Systems  Musculoskeletal: Positive for myalgias and arthralgias.  All other systems reviewed and are negative.     Allergies  Aspirin and Norco  Home Medications   Prior to Admission  medications   Medication Sig Start Date End Date Taking? Authorizing Provider  acetaminophen (TYLENOL) 500 MG tablet Take 1,500 mg by mouth every 6 (six) hours as needed (for pain.).     Historical Provider, MD  clindamycin (CLEOCIN) 300 MG capsule Take 1 capsule (300 mg total) by mouth every 6 (six) hours. 04/26/15   Elson Areas, PA-C  ibuprofen (ADVIL,MOTRIN) 600 MG tablet Take 1 tablet (600 mg total) by mouth every 6 (six) hours as needed. 05/02/15   Eyvonne Mechanic, PA-C  methocarbamol (ROBAXIN) 500 MG tablet Take 1 tablet (500 mg total) by mouth 2 (two) times daily. 05/02/15   Eyvonne Mechanic, PA-C  penicillin v potassium (VEETID) 500 MG tablet Take 1 tablet (500 mg total) by mouth 3 (three) times daily. 02/14/15   Renne Crigler, PA-C  traMADol (ULTRAM) 50 MG tablet Take 1 tablet (50 mg total) by mouth every 6 (six) hours as needed. 02/14/15   Renne Crigler, PA-C   BP 133/78 mmHg  Pulse 68  Temp(Src) 98.6 F (37 C) (Oral)  Resp 17  SpO2 98%  LMP 06/25/2015   Physical Exam  Constitutional: She is oriented to person, place, and time. She appears well-developed and well-nourished.  HENT:  Head: Normocephalic and atraumatic.  Mouth/Throat: Oropharynx is clear and moist.  Eyes: Conjunctivae and EOM are normal. Pupils are equal, round,  and reactive to light.  Neck: Normal range of motion.  Cardiovascular: Normal rate, regular rhythm and normal heart sounds.   Pulmonary/Chest: Effort normal and breath sounds normal. No respiratory distress. She has no wheezes.  Abdominal: Soft. Bowel sounds are normal.  Musculoskeletal: Normal range of motion.  No noted calf asymmetry, tenderness, or palpable cords; no overlying skin changes or warmth to touch; both legs are neurovascularly intact; normal gait  Neurological: She is alert and oriented to person, place, and time.  Skin: Skin is warm and dry.  Psychiatric: She has a normal mood and affect.  Nursing note and vitals reviewed.   ED Course   Procedures (including critical care time) Labs Review Labs Reviewed  CBC WITH DIFFERENTIAL/PLATELET - Abnormal; Notable for the following:    Neutrophils Relative % 42 (*)    Lymphocytes Relative 50 (*)    All other components within normal limits  BASIC METABOLIC PANEL  I-STAT TROPOININ, ED    Imaging Review Dg Chest 2 View  06/27/2015   CLINICAL DATA:  Chest pain. Left-sided pressure. Duration of symptoms 2 days. Personal history of hypertension and 30 pack-year smoking.  EXAM: CHEST  2 VIEW  COMPARISON:  10/16/2014  FINDINGS: Heart size is normal. Mediastinal shadows are normal. The lungs are clear except for slightly increased interstitial markings. No infiltrate, mass, effusion or collapse. The vascularity is normal. Ordinary mild degenerative changes affect the spine.  IMPRESSION: No active disease. Slight prominent interstitial markings consistent with the described smoking history.   Electronically Signed   By: Paulina Fusi M.D.   On: 06/27/2015 15:22   VASCULAR LAB PRELIMINARY PRELIMINARY PRELIMINARY PRELIMINARY  Right lower extremity venous duplex completed.   Preliminary report: Right: No evidence of DVT, superficial thrombosis, or Baker's cyst.  Cestone,Helene, RVT 06/27/2015, 5:36 PM   EKG Interpretation   Date/Time:  Tuesday June 27 2015 14:41:15 EDT Ventricular Rate:  82 PR Interval:  158 QRS Duration: 84 QT Interval:  354 QTC Calculation: 413 R Axis:   65 Text Interpretation:  Normal sinus rhythm with sinus arrhythmia Normal ECG  When compared with ECG of 12/29/2013, No significant change was found  Confirmed by [facility]  MD, DAVID ([id number]) on 06/27/2015 3:12:52 PM      MDM   Final diagnoses:  Pain of right lower extremity   29 year old female here with bilateral leg pain for the past 2 days. Triage note reports chest pain, however patient denies this to me. She has no clinical signs of lower extremity DVT on exam, however does express concern for this.   EKG, labs, and chest x-ray are reassuring. Lower extremity duplex of right leg was obtained, negative for DVT.  Patient given dose of Naprosyn here in the ED, improvement of symptoms. She has ambulated independently to bathroom without difficulty.  Continues to deny any chest pain or SOB.  Patient stable for discharge.  Rx naprosyn.  Does not have PCP currently, has medicaid.  Resource guide given.  Discussed plan with patient, he/she acknowledged understanding and agreed with plan of care.  Return precautions given for new or worsening symptoms.  Garlon Hatchet, PA-C 06/27/15 Ninfa Linden  Blake Divine, MD 06/29/15 [phone]

## 2015-06-27 NOTE — ED Notes (Signed)
Pt sts left sided CP into shoulder and bilateral leg pain x 2 days

## 2015-06-27 NOTE — ED Notes (Signed)
Pt is in stable condition upon d/c and ambulates from ED. 

## 2015-06-27 NOTE — ED Notes (Signed)
Pt placed in a gown and hooked up to the monitor with the 5 lead, BP cuff and pulse ox 

## 2015-06-27 NOTE — Progress Notes (Signed)
VASCULAR LAB PRELIMINARY  PRELIMINARY  PRELIMINARY  PRELIMINARY  Right lower extremity venous duplex completed.    Preliminary report:  Right:  No evidence of DVT, superficial thrombosis, or Baker's cyst.  Penny Young, RVT 06/27/2015, 5:36 PM

## 2015-06-27 NOTE — Discharge Instructions (Signed)
Take the prescribed medication as directed. Follow-up with a primary care physician in the area. The resource guide to help with this. Return to the ED for new or worsening symptoms.   Emergency Department Resource Guide 1) Find a Doctor and Pay Out of Pocket Although you won't have to find out who is covered by your insurance plan, it is a good idea to ask around and get recommendations. You will then need to call the office and see if the doctor you have chosen will accept you as a new patient and what types of options they offer for patients who are self-pay. Some doctors offer discounts or will set up payment plans for their patients who do not have insurance, but you will need to ask so you aren't surprised when you get to your appointment.  2) Contact Your Local Health Department Not all health departments have doctors that can see patients for sick visits, but many do, so it is worth a call to see if yours does. If you don't know where your local health department is, you can check in your phone book. The CDC also has a tool to help you locate your state's health department, and many state websites also have listings of all of their local health departments.  3) Find a Walk-in Clinic If your illness is not likely to be very severe or complicated, you may want to try a walk in clinic. These are popping up all over the country in pharmacies, drugstores, and shopping centers. They're usually staffed by nurse practitioners or physician assistants that have been trained to treat common illnesses and complaints. They're usually fairly quick and inexpensive. However, if you have serious medical issues or chronic medical problems, these are probably not your best option.  No Primary Care Doctor: - Call Health Connect at  [phone] - they can help you locate a primary care doctor that  accepts your insurance, provides certain services, etc. - Physician Referral Service- [phone]  Chronic Pain  Problems: Organization         Address  Phone   Notes  [facility]  [phone] Patients need to be referred by their primary care doctor.   Medication Assistance: Organization         Address  Phone   Notes  [facility] [address] [place] [place], [place] [address] [phone] --Must be a resident of [facility] -- Must have NO insurance coverage whatsoever (no Medicaid/ Medicare, etc.) -- The pt. MUST have a primary care doctor that directs their care regularly and follows them in the community   [facility]  [phone]   [employer]  [phone]    Agencies that provide inexpensive medical care: Organization         Address  Phone   Notes  [facility]  [phone]   [facility]    [phone]   [facility] [address] [place], [place] [address] [phone]   [facility] [address] [place], [place] [phone]   [facility]anned Parenthood    [phone]   [facility]    [phone]   [facility]  [address], [place] Phone:  [phone], Fax:  [phone] Hours of Operation:  9 am - 6 pm, M-F.  Also accepts Medicaid/Medicare and self-pay.  [facility]  [address], [place] Phone: [phone], Fax: [phone]. Hours of Operation:  8:30 am - 5:30 pm, M-F.  Also accepts Medicaid and self-pay.  °[facility] [place] [address], [place] Phone: [phone]   °[facility] [address], [place], [place] [phone], Ext. 123 Mondays & Thursdays: 7-9 AM.  First 15 patients are seen on a first come, first serve basis. °  ° °Medicaid-accepting [place] Providers: ° °Organization         Address  Phone   Notes  °[facility] [address], [place] [phone] Also  accepts self-pay patients.  °[facility] [address], [place] ° ([phone]) [phone]   °[facility] [address], [place] ([phone]) [phone]   °[facility] [address] [place] [address], [place] ([phone]) [phone]   °Veita Bland [address], [place]  ° ([phone] Only accepts [place] Access Medicaid patients after they have their name applied to their card.  ° °Self-Pay (no insurance) in [place]: ° °Organization         Address  Phone   Notes  °Sickle Cell Patients, [facility] [address], [place] [phone]) [phone]   °[facility] [address], [place] ([phone]) [phone]   °[facility] ° [address], [place] [phone]) [phone]   °[facility]/Dr. Osei-Bonsu ° [address] [place] [address], [place] or [address], [place] [phone] Phone number for both [place] and [place] locations is the same.  °[facility] [address], [place] [phone]) [phone]   °[facility] [address] [place] [address], [place] or [address] ([phone]) [phone] °([phone]) [phone]   °[facility] [address], [place] ([phone]) [phone], phone; ([phone], fax Sees patients 1st and 3rd Saturday of every month.  Must not qualify for public or private insurance (i.e. Medicaid, Medicare, [place] Health Choice, Veterans' Benefits) • Household income should be no more than 200% of the poverty level •The clinic cannot treat you if you are pregnant or think you are pregnant • Sexually transmitted diseases are not treated at the clinic.  ° ° °Dental Care: °Organization         Address  Phone  Notes  °[place] [facility] [address], [place] ([phone]) [phone] Accepts children up to age 21 who are enrolled in Medicaid or [place] Health Choice; pregnant  women with a Medicaid card; and children who have applied for Medicaid or [place] Health Choice, but were declined, whose parents can pay a reduced fee at time of service.  °[place] [facility] [place]  [address], [place] ([phone]) [phone] Accepts children up to age 21 who are enrolled in Medicaid or [place] Health Choice; pregnant women with a Medicaid card; and children who have applied for Medicaid or [place] Health Choice, but were declined, whose parents can pay a reduced fee at time of service.  °[facility] ° [address], [place] [phone]) [phone] Patients are seen by appointment only. Walk-ins are not accepted. [facility] will see patients 18 years of age and older. °Monday - Tuesday (8am-5pm) °Most Wednesdays (8:30-5pm) °$30 per visit, cash only  °[facility]   [facility]  [address], [facility] [phone] Patients are seen by appointment only. Walk-ins are not accepted. [place] will see patients 37 years of age and older. One Wednesday Evening (Monthly: Volunteer Based).  $30 per visit, cash only  [place]  [phone] for adults; Children under age 77, call Graduate Pediatric Dentistry at [phone]. Children aged 79-14, please call [phone] to request a pediatric application.  Dental services are provided in all areas of dental care including fillings, crowns and bridges, complete and partial dentures, implants, gum treatment, root canals, and extractions. Preventive care is also provided. Treatment is provided to both adults and children. Patients are selected via a lottery and there is often a waiting list.   [facility] [address], [place]  [phone] [email or web address]   [facility] [address] [place], [place] [phone], Ext. [phone] Second and Fourth Thursday of each month, opens at 6:30 AM; Clinic ends at 9 AM.  Patients are  seen on a first-come first-served basis, and a limited number are seen during each clinic.   [facility]  [address] [place] D[place]er [place], [place] [phone]   Eligibility Requirements You must have lived in [place], [place], or [place] counties for at least the last three months.   You cannot be eligible for state or federal sponsored [employer], including [employer], [place], or [employer].   You generally cannot be eligible for healthcare insurance through your employer.    How to apply: Eligibility screenings are held every Tuesday and Wednesday afternoon from 1:00 pm until 4:00 pm. You do not need an appointment for the interview!  [facility] [address], [place], [place]   [place]  [place] Department  [place]  [phone]    Behavioral Health Resources in the Community: Intensive Outpatient Programs Organization         Address  Phone  Notes  [place]. [address] [place], [place] [phone]   [facility] [address], [place], [place]   [facility] [address] [place], [place]   [place] [address],  [place], [place] or [phone]   Substance Abuse Resources Organization         Address  Phone  Notes  [facility]  [phone]   [employer]  [phone]   [facility] [place] [facility]   Chinita Pester  [phone]   Residential & Outpatient Substance Abuse Program  [phone]   Psychological Services Organization         Address  Phone  Notes  [facility] [place]  [place]  [phone]   [place] [address] [place] [phone] or [phone]    Mobile Crisis  Teams Organization         Address  Phone  Notes  [facility], Mobile Crisis Care Unit  [phone]   [facility]  [address] [place], [place]   [place] [address], [place] [phone]    Self-Help/Support Groups Organization         Address  Phone             Notes  Mental Health  [place] - variety of support groups  336- H3156881 Call for more information  Narcotics Anonymous (NA), [facility] [address], [employer] [place]  2 meetings at this location   Special educational needs teacher         Address  Phone  Notes  [facility] [place],    [place]  [phone])[phone]   [facility]  [address], [place] [id number], [place], [place]   [place], [place] [phone] Admissions: 8am-3pm M-F  Incentives Substance [place] [address],    [place], [place] [phone]   [facility] [address] [place], [place], [place]   [facility] [address],  [place], [place]   [facility] - Intensive Outpatient [address], [place], [place]   [facility]  [place], [place] [phone])[phone] or [phone]   [facility] ([facility]) [address] [place], [place] Accepts Medicaid  [facility] [address],  [place] [phone] Substance Abuse/Addiction Treatment   [facility] Organization         Address  Phone  Notes  [facility]  [phone]   Domenic Schwab, PhD [address] [place], [place]   [phone] or [phone]   [place], [place] [phone]   [facility] [address], [place], [place] [phone]  Insurance/Medicaid/sponsorship through [facility] and Families [address], [address] [place]                                    [place], [place] [phone] [employer] [address], [place] [phone]    Dr. Adele Schilder  [phone]   [facility] Dept. 1) [address] [place] 2) [place] 3)  [place] [address], [place] [phone]  [phone]   [place] [phone] or [phone] (After Hours)

## 2015-06-29 ENCOUNTER — Emergency Department (HOSPITAL_COMMUNITY)
Admission: EM | Admit: 2015-06-29 | Discharge: 2015-06-29 | Disposition: A | Discharge status: Home / Self Care | Payer: Medicaid Other | Attending: Emergency Medicine | Admitting: Emergency Medicine

## 2015-06-29 ENCOUNTER — Encounter (HOSPITAL_COMMUNITY): Payer: Self-pay | Admitting: Neurology

## 2015-06-29 DIAGNOSIS — R42 Dizziness and giddiness: Secondary | ICD-10-CM

## 2015-06-29 DIAGNOSIS — Z720 Tobacco use: Secondary | ICD-10-CM | POA: Insufficient documentation

## 2015-06-29 DIAGNOSIS — E669 Obesity, unspecified: Secondary | ICD-10-CM | POA: Insufficient documentation

## 2015-06-29 DIAGNOSIS — Z21 Asymptomatic human immunodeficiency virus [HIV] infection status: Secondary | ICD-10-CM | POA: Diagnosis not present

## 2015-06-29 DIAGNOSIS — Z791 Long term (current) use of non-steroidal anti-inflammatories (NSAID): Secondary | ICD-10-CM | POA: Insufficient documentation

## 2015-06-29 DIAGNOSIS — Z8619 Personal history of other infectious and parasitic diseases: Secondary | ICD-10-CM | POA: Diagnosis not present

## 2015-06-29 DIAGNOSIS — J45909 Unspecified asthma, uncomplicated: Secondary | ICD-10-CM | POA: Diagnosis not present

## 2015-06-29 DIAGNOSIS — Z8659 Personal history of other mental and behavioral disorders: Secondary | ICD-10-CM | POA: Diagnosis not present

## 2015-06-29 HISTORY — DX: Restless legs syndrome: G25.81

## 2015-06-29 LAB — I-STAT BETA HCG BLOOD, ED (MC, WL, AP ONLY): I-stat hCG, quantitative: <5 m[IU]/mL (ref <5)

## 2015-06-29 LAB — I-STAT CHEM 8, ED
BUN: 12 mg/dL (ref 6–20)
CREATININE: 0.6 mg/dL (ref 0.44–1.00)
Calcium, Ion: 1.12 mmol/L (ref 1.12–1.23)
Chloride: 105 mmol/L (ref 101–111)
GLUCOSE: 91 mg/dL (ref 65–99)
HCT: 42 % (ref 36.0–46.0)
HEMOGLOBIN: 14.3 g/dL (ref 12.0–15.0)
Potassium: 3.7 mmol/L (ref 3.5–5.1)
SODIUM: 140 mmol/L (ref 135–145)
TCO2: 20 mmol/L (ref 0–100)

## 2015-06-29 MED ORDER — ONDANSETRON HCL 4 MG/2ML IJ SOLN
4.0000 mg | Freq: Once | INTRAMUSCULAR | Status: AC
  Administered 2015-06-29: 4 mg via INTRAVENOUS
  Filled 2015-06-29: qty 2

## 2015-06-29 MED ORDER — MECLIZINE HCL 25 MG PO TABS: 25.0000 mg | ORAL_TABLET | Freq: Three times a day (TID) | ORAL | Status: DC | PRN

## 2015-06-29 MED ORDER — ONDANSETRON 8 MG PO TBDP: 8.0000 mg | ORAL_TABLET | Freq: Three times a day (TID) | ORAL | Status: DC | PRN

## 2015-06-29 MED ORDER — LORAZEPAM 2 MG/ML IJ SOLN
1.0000 mg | Freq: Once | INTRAMUSCULAR | Status: AC
  Administered 2015-06-29: 1 mg via INTRAVENOUS
  Filled 2015-06-29: qty 1

## 2015-06-29 MED ORDER — SODIUM CHLORIDE 0.9 % IV BOLUS (SEPSIS)
1000.0000 mL | Freq: Once | INTRAVENOUS | Status: AC
  Administered 2015-06-29: 1000 mL via INTRAVENOUS

## 2015-06-29 NOTE — Discharge Instructions (Signed)

## 2015-06-29 NOTE — ED Notes (Signed)
NAD at this time. Pt is stable and going home.  

## 2015-06-29 NOTE — ED Notes (Signed)
Per ems- pt reports she was standing beside her car getting ready to go to the unemployment office when she got dizzy and had tingling in her hands at 1345. Pt got back in her car and laid down with no relief. Reports lower abd cramping but is on her period today. Pt appears tearful and anxious. BP 130/80, HR 90, 18 RR, 80 CBG.

## 2015-06-30 NOTE — ED Provider Notes (Signed)
CSN: [id number]     Arrival date & time 06/29/15  1416 History   First MD Initiated Contact with Patient 06/29/15 1420     Chief Complaint  Patient presents with  . Dizziness     HPI Patient reports acute onset symptoms of the room spinning around.  This results in tingling of her bilateral hands.  She states she feels nauseated.  She feels as though things in front of her moving around.  No recent head injury or trauma.  She reports some mild lower abdominal cramping but reports is normal when she is menstruating.  She denies fevers and chills.  No neck pain.  Denies chest pain shortness breath.  No upper abdominal pain.  Denies back pain.  She reports opening her eyes worsens her symptoms.   Past Medical History  Diagnosis Date  . Bipolar 1 disorder   . Schizophrenia   . Hepatitis B   . HIV (human immunodeficiency virus infection)   . Asthma   . Drug abuse   . Obese   . Restless leg syndrome    Past Surgical History  Procedure Laterality Date  . Tympanostomy tube placement    . Hernia repair    . Tubal ligation     Family History  Problem Relation Age of Onset  . Hypertension     History  Substance Use Topics  . Smoking status: Current Every Day Smoker -- 1.50 packs/day    Types: Cigarettes    Last Attempt to Quit: 11/26/2007  . Smokeless tobacco: Not on file  . Alcohol Use: Yes     Comment: occ   OB History    No data available     Review of Systems  All other systems reviewed and are negative.     Allergies  Aspirin and Norco  Home Medications   Prior to Admission medications   Medication Sig Start Date End Date Taking? Authorizing Provider  acetaminophen (TYLENOL) 500 MG tablet Take 1,500 mg by mouth every 6 (six) hours as needed (for pain.).    Yes Historical Provider, MD  ibuprofen (ADVIL,MOTRIN) 600 MG tablet Take 1 tablet (600 mg total) by mouth every 6 (six) hours as needed. 05/02/15  Yes Eyvonne Mechanic, PA-C  meclizine (ANTIVERT) 25 MG tablet Take  1 tablet (25 mg total) by mouth 3 (three) times daily as needed for dizziness. 06/29/15   Azalia Bilis, MD  naproxen (NAPROSYN) 500 MG tablet Take 1 tablet (500 mg total) by mouth 2 (two) times daily with a meal. 06/27/15   Garlon Hatchet, PA-C  ondansetron (ZOFRAN ODT) 8 MG disintegrating tablet Take 1 tablet (8 mg total) by mouth every 8 (eight) hours as needed for nausea or vomiting. 06/29/15   Azalia Bilis, MD   BP 129/94 mmHg  Pulse 81  Temp(Src) 98.3 F (36.8 C) (Oral)  Resp 18  SpO2 98%  LMP 06/25/2015 Physical Exam  Constitutional: She is oriented to person, place, and time. She appears well-developed and well-nourished. No distress.  HENT:  Head: Normocephalic and atraumatic.  Eyes: EOM are normal. Pupils are equal, round, and reactive to light.  Neck: Normal range of motion.  Cardiovascular: Normal rate, regular rhythm and normal heart sounds.   Pulmonary/Chest: Effort normal and breath sounds normal.  Abdominal: Soft. She exhibits no distension. There is no tenderness.  Musculoskeletal: Normal range of motion.  Neurological: She is alert and oriented to person, place, and time.  5/5 strength in major muscle groups of  bilateral  upper and lower extremities. Speech normal. No facial asymetry.   Skin: Skin is warm and dry.  Psychiatric: She has a normal mood and affect. Judgment normal.  Nursing note and vitals reviewed.   ED Course  Procedures (including critical care time) Labs Review Labs Reviewed  I-STAT CHEM 8, ED  I-STAT BETA HCG BLOOD, ED ([facility], [facility], [facility] ONLY)    Imaging Review No results found.   EKG Interpretation   Date/Time:  Thursday June 29 2015 14:20:30 EDT Ventricular Rate:  89 PR Interval:  183 QRS Duration: 61 QT Interval:  330 QTC Calculation: 401 R Axis:   59 Text Interpretation:  Sinus rhythm Ventricular premature complex Left  atrial enlargement Minimal ST depression ST elev, probable normal early  repol pattern  MDM   Final diagnoses:   Vertigo    Patient feels much better after benzodiazepines in the emergency department.  I suspect this is peripheral vertigo.  Doubt central vertigo.  Overall well-appearing.  Discharge home in good condition.    Azalia Bilis, MD 06/30/15 1535

## 2015-07-07 ENCOUNTER — Emergency Department (HOSPITAL_COMMUNITY): Payer: Medicaid Other

## 2015-07-07 ENCOUNTER — Emergency Department (HOSPITAL_COMMUNITY)
Admission: EM | Admit: 2015-07-07 | Discharge: 2015-07-08 | Disposition: A | Discharge status: Home / Self Care | Payer: Medicaid Other | Attending: Emergency Medicine | Admitting: Emergency Medicine

## 2015-07-07 DIAGNOSIS — Z791 Long term (current) use of non-steroidal anti-inflammatories (NSAID): Secondary | ICD-10-CM | POA: Diagnosis not present

## 2015-07-07 DIAGNOSIS — E669 Obesity, unspecified: Secondary | ICD-10-CM | POA: Insufficient documentation

## 2015-07-07 DIAGNOSIS — J45901 Unspecified asthma with (acute) exacerbation: Secondary | ICD-10-CM | POA: Diagnosis not present

## 2015-07-07 DIAGNOSIS — Z21 Asymptomatic human immunodeficiency virus [HIV] infection status: Secondary | ICD-10-CM | POA: Diagnosis not present

## 2015-07-07 DIAGNOSIS — R079 Chest pain, unspecified: Secondary | ICD-10-CM | POA: Diagnosis not present

## 2015-07-07 DIAGNOSIS — Z720 Tobacco use: Secondary | ICD-10-CM | POA: Insufficient documentation

## 2015-07-07 DIAGNOSIS — Z8669 Personal history of other diseases of the nervous system and sense organs: Secondary | ICD-10-CM | POA: Insufficient documentation

## 2015-07-07 DIAGNOSIS — Z8619 Personal history of other infectious and parasitic diseases: Secondary | ICD-10-CM | POA: Diagnosis not present

## 2015-07-07 DIAGNOSIS — R059 Cough, unspecified: Secondary | ICD-10-CM

## 2015-07-07 DIAGNOSIS — Z8659 Personal history of other mental and behavioral disorders: Secondary | ICD-10-CM | POA: Diagnosis not present

## 2015-07-07 DIAGNOSIS — R05 Cough: Secondary | ICD-10-CM

## 2015-07-07 LAB — BASIC METABOLIC PANEL
Anion gap: 8 (ref 5–15)
BUN: 15 mg/dL (ref 6–20)
CALCIUM: 9.2 mg/dL (ref 8.9–10.3)
CO2: 24 mmol/L (ref 22–32)
Chloride: 103 mmol/L (ref 101–111)
Creatinine, Ser: 0.71 mg/dL (ref 0.44–1.00)
Glucose, Bld: 91 mg/dL (ref 65–99)
POTASSIUM: 3.8 mmol/L (ref 3.5–5.1)
SODIUM: 135 mmol/L (ref 135–145)

## 2015-07-07 LAB — CBC
HEMATOCRIT: 40.1 % (ref 36.0–46.0)
Hemoglobin: 13.7 g/dL (ref 12.0–15.0)
MCH: 28.1 pg (ref 26.0–34.0)
MCHC: 34.2 g/dL (ref 30.0–36.0)
MCV: 82.3 fL (ref 78.0–100.0)
Platelets: 328 10*3/uL (ref 150–400)
RBC: 4.87 MIL/uL (ref 3.87–5.11)
RDW: 13.6 % (ref 11.5–15.5)
WBC: 6 10*3/uL (ref 4.0–10.5)

## 2015-07-07 LAB — I-STAT TROPONIN, ED: TROPONIN I, POC: 0 ng/mL (ref 0.00–0.08)

## 2015-07-07 MED ORDER — ALBUTEROL SULFATE HFA 108 (90 BASE) MCG/ACT IN AERS
2.0000 | INHALATION_SPRAY | Freq: Once | RESPIRATORY_TRACT | Status: AC
  Administered 2015-07-07: 2 via RESPIRATORY_TRACT
  Filled 2015-07-07: qty 6.7

## 2015-07-07 NOTE — Discharge Instructions (Signed)
If you were given medicines take as directed.  If you are on coumadin or contraceptives realize their levels and effectiveness is altered by many different medicines.  If you have any reaction (rash, tongues swelling, other) to the medicines stop taking and see a physician.    If your blood pressure was elevated in the ER make sure you follow up for management with a primary doctor or return for chest pain, shortness of breath or stroke symptoms.  Please follow up as directed and return to the ER or see a physician for new or worsening symptoms.  Thank you. Filed Vitals:   07/07/15 2153  BP: 136/64  Pulse: 87  Temp: 98.2 F (36.8 C)  TempSrc: Oral  Resp: 20  SpO2: 100%

## 2015-07-07 NOTE — ED Notes (Addendum)
Pt complains of shortness of breath and 10/10 chest pressure for the past hour. Pt states she has been coughing up yellow-white phlem for the past 2 days. Pt states her SOB and chest pain became worse after smelling smoke from a black and mild cigar.

## 2015-07-07 NOTE — ED Provider Notes (Signed)
CSN: [id number]     Arrival date & time 07/07/15  2146 History   First MD Initiated Contact with Patient 07/07/15 2213     Chief Complaint  Patient presents with  . Shortness of Breath  . Chest Pain     (Consider location/radiation/quality/duration/timing/severity/associated sxs/prior Treatment) HPI Comments: 29 year old female with history of HIV, asthma, bipolar, obesity presents with productive cough anterior chest pain bilateral nonradiating. No cardiac or blood clot history, no recent surgeries or leg swelling, no recent travel. Patient felt it started after being exposed to cigarette smoke 3 days ago. Symptoms intermittent. No exertional symptoms  Patient is a 29 y.o. female presenting with shortness of breath and chest pain. The history is provided by the patient.  Shortness of Breath Associated symptoms: chest pain and cough   Associated symptoms: no abdominal pain, no fever, no headaches, no neck pain, no rash and no vomiting   Chest Pain Associated symptoms: cough and shortness of breath   Associated symptoms: no abdominal pain, no back pain, no fever, no headache and not vomiting     Past Medical History  Diagnosis Date  . Bipolar 1 disorder   . Schizophrenia   . Hepatitis B   . HIV (human immunodeficiency virus infection)   . Asthma   . Drug abuse   . Obese   . Restless leg syndrome    Past Surgical History  Procedure Laterality Date  . Tympanostomy tube placement    . Hernia repair    . Tubal ligation     Family History  Problem Relation Age of Onset  . Hypertension     Social History  Substance Use Topics  . Smoking status: Current Every Day Smoker -- 1.50 packs/day    Types: Cigarettes    Last Attempt to Quit: 11/26/2007  . Smokeless tobacco: Not on file  . Alcohol Use: Yes     Comment: occ   OB History    No data available     Review of Systems  Constitutional: Negative for fever and chills.  HENT: Negative for congestion.   Eyes: Negative  for visual disturbance.  Respiratory: Positive for cough and shortness of breath.   Cardiovascular: Positive for chest pain. Negative for leg swelling.  Gastrointestinal: Negative for vomiting and abdominal pain.  Genitourinary: Negative for dysuria and flank pain.  Musculoskeletal: Negative for back pain, neck pain and neck stiffness.  Skin: Negative for rash.  Neurological: Negative for light-headedness and headaches.      Allergies  Aspirin and Norco  Home Medications   Prior to Admission medications   Medication Sig Start Date End Date Taking? Authorizing Provider  acetaminophen (TYLENOL) 500 MG tablet Take 1,500 mg by mouth every 6 (six) hours as needed (for pain.).    Yes Historical Provider, MD  ibuprofen (ADVIL,MOTRIN) 600 MG tablet Take 1 tablet (600 mg total) by mouth every 6 (six) hours as needed. Patient taking differently: Take 600 mg by mouth every 6 (six) hours as needed for moderate pain.  05/02/15  Yes Eyvonne Mechanic, PA-C  meclizine (ANTIVERT) 25 MG tablet Take 1 tablet (25 mg total) by mouth 3 (three) times daily as needed for dizziness. 06/29/15  Yes Azalia Bilis, MD  naproxen (NAPROSYN) 500 MG tablet Take 1 tablet (500 mg total) by mouth 2 (two) times daily with a meal. 06/27/15  Yes Garlon Hatchet, PA-C  ondansetron (ZOFRAN ODT) 8 MG disintegrating tablet Take 1 tablet (8 mg total) by mouth every 8 (eight) hours  as needed for nausea or vomiting. 06/29/15   Azalia Bilis, MD   BP 136/64 mmHg  Pulse 87  Temp(Src) 98.2 F (36.8 C) (Oral)  Resp 20  SpO2 100%  LMP 06/25/2015 Physical Exam  Constitutional: She is oriented to person, place, and time. She appears well-developed and well-nourished.  HENT:  Head: Normocephalic and atraumatic.  Eyes: Conjunctivae are normal. Right eye exhibits no discharge. Left eye exhibits no discharge.  Neck: Normal range of motion. Neck supple. No tracheal deviation present.  Cardiovascular: Normal rate and regular rhythm.    Pulmonary/Chest: Effort normal and breath sounds normal. No respiratory distress.  Abdominal: Soft. She exhibits no distension. There is no tenderness. There is no guarding.  Musculoskeletal: She exhibits no edema.  Neurological: She is alert and oriented to person, place, and time.  Skin: Skin is warm. No rash noted.  Psychiatric: She has a normal mood and affect.  Nursing note and vitals reviewed.   ED Course  Procedures (including critical care time) Labs Review Labs Reviewed  BASIC METABOLIC PANEL  CBC  I-STAT TROPOININ, ED    Imaging Review Dg Chest 2 View  07/07/2015   CLINICAL DATA:  Shortness of Breath  EXAM: CHEST  2 VIEW  COMPARISON:  06/27/2015  FINDINGS: The heart size and mediastinal contours are within normal limits. Both lungs are clear. The visualized skeletal structures are unremarkable.  IMPRESSION: No active cardiopulmonary disease.   Electronically Signed   By: Natasha Mead M.D.   On: 07/07/2015 22:35   I, Rayan Ines M, personally reviewed and evaluated these images and lab results as part of my medical decision-making.   EKG Interpretation   Date/Time:  Friday July 07 2015 21:54:39 EDT Ventricular Rate:  90 PR Interval:  149 QRS Duration: 84 QT Interval:  354 QTC Calculation: 433 R Axis:   51 Text Interpretation:  Sinus rhythm Consider right atrial enlargement  Confirmed by Shayli Altemose  MD, Izzabella Besse ([id number]) on 07/07/2015 10:41:06 PM      MDM   Final diagnoses:  Cough  Chest pain, unspecified chest pain type   Well-appearing patient presents with clinical concern for pleuritis/bronchitis. Chest x-ray reviewed by myself and showed the patient no acute findings. Screening cardiac unremarkable. Patient very low risk and not consistent with cardiac. No classic blood clot risk factors. Plan for albuterol and close follow-up outpatient.  Results and differential diagnosis were discussed with the patient/parent/guardian. Xrays were independently reviewed by  myself.  Close follow up outpatient was discussed, comfortable with the plan.   Medications  albuterol (PROVENTIL HFA;VENTOLIN HFA) 108 (90 BASE) MCG/ACT inhaler 2 puff (not administered)    Filed Vitals:   07/07/15 2153  BP: 136/64  Pulse: 87  Temp: 98.2 F (36.8 C)  TempSrc: Oral  Resp: 20  SpO2: 100%    Final diagnoses:  Cough  Chest pain, unspecified chest pain type      Blane Ohara, MD 07/07/15 [phone]

## 2016-12-05 ENCOUNTER — Emergency Department
Admission: EM | Admit: 2016-12-05 | Discharge: 2016-12-05 | Disposition: A | Discharge status: Home / Self Care | Payer: Medicaid Other | Attending: Emergency Medicine | Admitting: Emergency Medicine

## 2016-12-05 ENCOUNTER — Encounter: Payer: Self-pay | Admitting: Emergency Medicine

## 2016-12-05 DIAGNOSIS — S161XXA Strain of muscle, fascia and tendon at neck level, initial encounter: Secondary | ICD-10-CM | POA: Insufficient documentation

## 2016-12-05 DIAGNOSIS — Z87891 Personal history of nicotine dependence: Secondary | ICD-10-CM | POA: Insufficient documentation

## 2016-12-05 DIAGNOSIS — Y999 Unspecified external cause status: Secondary | ICD-10-CM | POA: Insufficient documentation

## 2016-12-05 DIAGNOSIS — Y939 Activity, unspecified: Secondary | ICD-10-CM | POA: Insufficient documentation

## 2016-12-05 DIAGNOSIS — S39012A Strain of muscle, fascia and tendon of lower back, initial encounter: Secondary | ICD-10-CM

## 2016-12-05 DIAGNOSIS — J45909 Unspecified asthma, uncomplicated: Secondary | ICD-10-CM | POA: Insufficient documentation

## 2016-12-05 DIAGNOSIS — X58XXXA Exposure to other specified factors, initial encounter: Secondary | ICD-10-CM | POA: Insufficient documentation

## 2016-12-05 DIAGNOSIS — Z21 Asymptomatic human immunodeficiency virus [HIV] infection status: Secondary | ICD-10-CM | POA: Insufficient documentation

## 2016-12-05 DIAGNOSIS — Z79899 Other long term (current) drug therapy: Secondary | ICD-10-CM | POA: Insufficient documentation

## 2016-12-05 DIAGNOSIS — Z791 Long term (current) use of non-steroidal anti-inflammatories (NSAID): Secondary | ICD-10-CM | POA: Insufficient documentation

## 2016-12-05 DIAGNOSIS — Y929 Unspecified place or not applicable: Secondary | ICD-10-CM | POA: Insufficient documentation

## 2016-12-05 MED ORDER — ORPHENADRINE CITRATE 30 MG/ML IJ SOLN
60.0000 mg | Freq: Two times a day (BID) | INTRAMUSCULAR | Status: DC
  Administered 2016-12-05: 60 mg via INTRAMUSCULAR
  Filled 2016-12-05: qty 2

## 2016-12-05 MED ORDER — METHYLPREDNISOLONE SODIUM SUCC 125 MG IJ SOLR
125.0000 mg | Freq: Once | INTRAMUSCULAR | Status: AC
  Administered 2016-12-05: 125 mg via INTRAMUSCULAR
  Filled 2016-12-05: qty 2

## 2016-12-05 MED ORDER — CYCLOBENZAPRINE HCL 10 MG PO TABS: 10.0000 mg | ORAL_TABLET | Freq: Three times a day (TID) | ORAL | 0 refills | Status: DC | PRN

## 2016-12-05 MED ORDER — IBUPROFEN 800 MG PO TABS: 800.0000 mg | ORAL_TABLET | Freq: Three times a day (TID) | ORAL | 0 refills | Status: DC | PRN

## 2016-12-05 MED ORDER — METHYLPREDNISOLONE SODIUM SUCC 125 MG IJ SOLR: 125.0000 mg | Freq: Once | INTRAMUSCULAR | Status: DC
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
## 2016-12-05 NOTE — ED Notes (Signed)
ED Provider at bedside. 

## 2016-12-05 NOTE — ED Notes (Signed)
Pt alert and oriented X4, active, cooperative, pt in NAD. RR even and unlabored, color WNL.  Pt informed to return if any life threatening symptoms occur.   

## 2016-12-05 NOTE — ED Notes (Signed)
Back pain, worse with movement. No known injury. Hx of similar, muscle relaxers helped.
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
## 2017-03-17 ENCOUNTER — Emergency Department
Admission: EM | Admit: 2017-03-17 | Discharge: 2017-03-17 | Disposition: A | Discharge status: Home / Self Care | Payer: Self-pay | Attending: Emergency Medicine | Admitting: Emergency Medicine

## 2017-03-17 ENCOUNTER — Emergency Department: Payer: Self-pay

## 2017-03-17 ENCOUNTER — Encounter: Payer: Self-pay | Admitting: Emergency Medicine

## 2017-03-17 DIAGNOSIS — J069 Acute upper respiratory infection, unspecified: Secondary | ICD-10-CM

## 2017-03-17 DIAGNOSIS — J45909 Unspecified asthma, uncomplicated: Secondary | ICD-10-CM | POA: Insufficient documentation

## 2017-03-17 DIAGNOSIS — Z79899 Other long term (current) drug therapy: Secondary | ICD-10-CM | POA: Insufficient documentation

## 2017-03-17 DIAGNOSIS — Z21 Asymptomatic human immunodeficiency virus [HIV] infection status: Secondary | ICD-10-CM | POA: Insufficient documentation

## 2017-03-17 DIAGNOSIS — Z87891 Personal history of nicotine dependence: Secondary | ICD-10-CM | POA: Insufficient documentation

## 2017-03-17 MED ORDER — FLUTICASONE PROPIONATE 50 MCG/ACT NA SUSP: 2.0000 | Freq: Every day | NASAL | Status: DC

## 2017-03-17 MED ORDER — PREDNISONE 10 MG (21) PO TBPK: ORAL_TABLET | ORAL | 0 refills | Status: DC

## 2017-03-17 MED ORDER — FLUTICASONE PROPIONATE 50 MCG/ACT NA SUSP: 2.0000 | Freq: Every day | NASAL | 0 refills | Status: DC

## 2017-03-17 NOTE — ED Triage Notes (Signed)
Sinus congestion and drainage x 2 days.

## 2017-03-17 NOTE — ED Provider Notes (Signed)
[facility] Emergency Department Provider Note  ____________________________________________  Time seen: Approximately 11:07 AM  I have reviewed the triage vital signs and the nursing notes.   HISTORY  Chief Complaint Nasal Congestion    HPI Penny Young is a 31 y.o. female that presents to the emergency department with chills, muscle aches, fatigue, and slight nasal congestion for 2 days. Worst symptom is the nasal congestion. Patient states that yesterday she had some left-sided chest pressure. She has not had an appetite today. No alleviating measures have been attempted. She denies sore throat, shortness of breath, nausea, vomiting, abdominal pain, diarrhea, constipation.   Past Medical History:  Diagnosis Date  . Asthma   . Bipolar 1 disorder (HCC)   . Drug abuse   . Hepatitis B   . HIV (human immunodeficiency virus infection) (HCC)   . Obese   . Restless leg syndrome   . Schizophrenia [facility])     Patient Active Problem List   Diagnosis Date Noted  . HIV DISEASE 04/27/2008    Past Surgical History:  Procedure Laterality Date  . HERNIA REPAIR    . TUBAL LIGATION    . TYMPANOSTOMY TUBE PLACEMENT      Prior to Admission medications   Medication Sig Start Date End Date Taking? Authorizing Provider  acetaminophen (TYLENOL) 500 MG tablet Take 1,500 mg by mouth every 6 (six) hours as needed (for pain.).     Historical Provider, MD  cyclobenzaprine (FLEXERIL) 10 MG tablet Take 1 tablet (10 mg total) by mouth 3 (three) times daily as needed. 12/05/16   Joni Reining, PA-C  fluticasone (FLONASE) 50 MCG/ACT nasal spray Place 2 sprays into both nostrils daily. 03/17/17 03/17/18  Enid Derry, PA-C  ibuprofen (ADVIL,MOTRIN) 600 MG tablet Take 1 tablet (600 mg total) by mouth every 6 (six) hours as needed. Patient taking differently: Take 600 mg by mouth every 6 (six) hours as needed for moderate pain.  05/02/15   Eyvonne Mechanic, PA-C  ibuprofen  (ADVIL,MOTRIN) 800 MG tablet Take 1 tablet (800 mg total) by mouth every 8 (eight) hours as needed for moderate pain. 12/05/16   Joni Reining, PA-C  meclizine (ANTIVERT) 25 MG tablet Take 1 tablet (25 mg total) by mouth 3 (three) times daily as needed for dizziness. 06/29/15   Azalia Bilis, MD  naproxen (NAPROSYN) 500 MG tablet Take 1 tablet (500 mg total) by mouth 2 (two) times daily with a meal. 06/27/15   Garlon Hatchet, PA-C  ondansetron (ZOFRAN ODT) 8 MG disintegrating tablet Take 1 tablet (8 mg total) by mouth every 8 (eight) hours as needed for nausea or vomiting. 06/29/15   Azalia Bilis, MD  predniSONE (STERAPRED UNI-PAK 21 TAB) 10 MG (21) TBPK tablet Take 6 tablets on day 1, take 5 tablets on day 2, take 4 tablets on day 3, take 3 tablets on day 4, take 2 tablets on day 5, take 1 tablet on day 6 03/17/17   Enid Derry, PA-C    Allergies Aspirin  Family History  Problem Relation Age of Onset  . Hypertension      Social History Social History  Substance Use Topics  . Smoking status: Former Smoker    Packs/day: 0.05    Types: Cigarettes    Quit date: 11/26/2007  . Smokeless tobacco: Never Used  . Alcohol use Yes     Comment: occ     Review of Systems  Constitutional: Positive for chills. ENT: Positive for nasal congestion. Respiratory:  No cough. No SOB. Gastrointestinal: No abdominal pain.  No nausea, no vomiting.  Musculoskeletal: Negative for musculoskeletal pain. Skin: Negative for rash, abrasions, lacerations, ecchymosis. Neurological: Negative for headaches, numbness or tingling   ____________________________________________   PHYSICAL EXAM:  VITAL SIGNS: ED Triage Vitals  Enc Vitals Group     BP 03/17/17 1016 138/90     Pulse Rate 03/17/17 1016 100     Resp 03/17/17 1016 15     Temp 03/17/17 1016 98.6 F (37 C)     Temp Source 03/17/17 1016 Oral     SpO2 03/17/17 1016 99 %     Weight 03/17/17 1018 (!) 310 lb 8 oz (140.8 kg)     Height 03/17/17 1018 5\' 7"   (1.702 m)     Head Circumference --      Peak Flow --      Pain Score 03/17/17 1026 9     Pain Loc --      Pain Edu? --      Excl. in GC? --      Constitutional: Alert and oriented. Well appearing and in no acute distress. Eyes: Conjunctivae are normal. PERRL. EOMI. Head: Atraumatic. ENT:      Ears: Tympanic membranes pearly gray with good landmarks.      Nose: Mild congestion/rhinnorhea.      Mouth/Throat: Mucous membranes are moist. Oropharynx non-erythematous. Tonsils not enlarged. Uvula midline. Neck: No stridor.  Cardiovascular: Normal rate, regular rhythm.  Good peripheral circulation. Respiratory: Normal respiratory effort without tachypnea or retractions. Scattered wheezes. Good air entry to the bases with no decreased or absent breath sounds. Gastrointestinal: Bowel sounds 4 quadrants. Soft and nontender to palpation. No guarding or rigidity. No palpable masses. No distention.  Musculoskeletal: Full range of motion to all extremities. No gross deformities appreciated. Neurologic:  Normal speech and language. No gross focal neurologic deficits are appreciated.  Skin:  Skin is warm, dry and intact. No rash noted.   ____________________________________________   LABS (all labs ordered are listed, but only abnormal results are displayed)  Labs Reviewed - No data to display ____________________________________________  EKG   ____________________________________________  RADIOLOGY Lexine Baton, personally viewed and evaluated these images (plain radiographs) as part of my medical decision making, as well as reviewing the written report by the radiologist.  Dg Chest 2 View  Result Date: 03/17/2017 CLINICAL DATA:  Chest pain, congestion, headache for 2 days EXAM: CHEST  2 VIEW COMPARISON:  07/07/2015 FINDINGS: The heart size and mediastinal contours are within normal limits. Both lungs are clear. The visualized skeletal structures are unremarkable. IMPRESSION: No  active cardiopulmonary disease. Electronically Signed   By: Elige Ko   On: 03/17/2017 11:49    ____________________________________________    PROCEDURES  Procedure(s) performed:    Procedures    Medications - No data to display   ____________________________________________   INITIAL IMPRESSION / ASSESSMENT AND PLAN / ED COURSE  Pertinent labs & imaging results that were available during my care of the patient were reviewed by me and considered in my medical decision making (see chart for details).  Review of the [place] CSRS was performed in accordance of the [facility] prior to dispensing any controlled drugs.   Patient's diagnosis is consistent with upper respiratory infection. Vital signs and exam are reassuring. Chest x-ray negative for acute cardiopulmonary processes. No cardiac changes on EKG. Patient will be discharged home with prescriptions for prednisone and Flonase. Patient is to follow up with PCP as directed. Patient is  given ED precautions to return to the ED for any worsening or new symptoms.     ____________________________________________  FINAL CLINICAL IMPRESSION(S) / ED DIAGNOSES  Final diagnoses:  Upper respiratory tract infection, unspecified type      NEW MEDICATIONS STARTED DURING THIS VISIT:  Discharge Medication List as of 03/17/2017 12:17 PM    START taking these medications   Details  fluticasone (FLONASE) 50 MCG/ACT nasal spray Place 2 sprays into both nostrils daily., Starting Mon 03/17/2017, Until Tue 03/17/2018, Print    predniSONE (STERAPRED UNI-PAK 21 TAB) 10 MG (21) TBPK tablet Take 6 tablets on day 1, take 5 tablets on day 2, take 4 tablets on day 3, take 3 tablets on day 4, take 2 tablets on day 5, take 1 tablet on day 6, Print            This chart was dictated using voice recognition software/Dragon. Despite best efforts to proofread, errors can occur which can change the meaning. Any change was purely unintentional.     Enid Derry, PA-C 03/17/17 1846    Enid Derry, PA-C 03/17/17 1847    Jene Every, MD 03/24/17 [phone]

## 2017-03-27 ENCOUNTER — Emergency Department: Payer: Medicaid Other

## 2017-03-27 ENCOUNTER — Encounter: Payer: Self-pay | Admitting: Emergency Medicine

## 2017-03-27 ENCOUNTER — Emergency Department
Admission: EM | Admit: 2017-03-27 | Discharge: 2017-03-27 | Disposition: A | Discharge status: Home / Self Care | Payer: Medicaid Other | Attending: Emergency Medicine | Admitting: Emergency Medicine

## 2017-03-27 DIAGNOSIS — Z87891 Personal history of nicotine dependence: Secondary | ICD-10-CM | POA: Insufficient documentation

## 2017-03-27 DIAGNOSIS — J45909 Unspecified asthma, uncomplicated: Secondary | ICD-10-CM | POA: Insufficient documentation

## 2017-03-27 DIAGNOSIS — R55 Syncope and collapse: Secondary | ICD-10-CM

## 2017-03-27 DIAGNOSIS — Z21 Asymptomatic human immunodeficiency virus [HIV] infection status: Secondary | ICD-10-CM | POA: Insufficient documentation

## 2017-03-27 DIAGNOSIS — N946 Dysmenorrhea, unspecified: Secondary | ICD-10-CM

## 2017-03-27 DIAGNOSIS — Z79899 Other long term (current) drug therapy: Secondary | ICD-10-CM | POA: Insufficient documentation

## 2017-03-27 LAB — URINALYSIS, COMPLETE (UACMP) WITH MICROSCOPIC
Bacteria, UA: NONE SEEN
SPECIFIC GRAVITY, URINE: 1.029 (ref 1.005–1.030)

## 2017-03-27 LAB — BASIC METABOLIC PANEL
Anion gap: 7 (ref 5–15)
BUN: 13 mg/dL (ref 6–20)
CALCIUM: 9 mg/dL (ref 8.9–10.3)
CO2: 24 mmol/L (ref 22–32)
CREATININE: 0.63 mg/dL (ref 0.44–1.00)
Chloride: 104 mmol/L (ref 101–111)
GFR calc Af Amer: >60 mL/min (ref >60)
GLUCOSE: 115 mg/dL — AB (ref 65–99)
Potassium: 4 mmol/L (ref 3.5–5.1)
Sodium: 135 mmol/L (ref 135–145)

## 2017-03-27 LAB — CBC
HCT: 39.6 % (ref 35.0–47.0)
HEMOGLOBIN: 12.9 g/dL (ref 12.0–16.0)
MCH: 25.6 pg — ABNORMAL LOW (ref 26.0–34.0)
MCHC: 32.7 g/dL (ref 32.0–36.0)
MCV: 78.4 fL — ABNORMAL LOW (ref 80.0–100.0)
PLATELETS: 277 10*3/uL (ref 150–440)
RBC: 5.05 MIL/uL (ref 3.80–5.20)
RDW: 14.6 % — AB (ref 11.5–14.5)
WBC: 6.5 10*3/uL (ref 3.6–11.0)

## 2017-03-27 LAB — POCT PREGNANCY, URINE: Preg Test, Ur: NEGATIVE

## 2017-03-27 MED ORDER — KETOROLAC TROMETHAMINE 30 MG/ML IJ SOLN
30.0000 mg | Freq: Once | INTRAMUSCULAR | Status: AC
  Administered 2017-03-27: 30 mg via INTRAVENOUS
  Filled 2017-03-27: qty 1

## 2017-03-27 MED ORDER — IBUPROFEN 800 MG PO TABS: 800.0000 mg | ORAL_TABLET | Freq: Three times a day (TID) | ORAL | 0 refills | Status: DC | PRN

## 2017-03-27 MED ORDER — SODIUM CHLORIDE 0.9 % IV BOLUS (SEPSIS)
1000.0000 mL | Freq: Once | INTRAVENOUS | Status: AC
  Administered 2017-03-27: 1000 mL via INTRAVENOUS

## 2017-03-27 NOTE — ED Notes (Signed)
Patient made aware of need of urine sample. States she is unable to void at this time.  

## 2017-03-27 NOTE — ED Provider Notes (Signed)
[facility] Emergency Department Provider Note  ____________________________________________  Time seen: Approximately 9:27 AM  I have reviewed the triage vital signs and the nursing notes.   HISTORY  Chief Complaint Loss of Consciousness    HPI Penny Young is a 31 y.o. female with a history of dysmenorrhea and associated recurrent syncopal episodes, schizophrenia, bipolar disorder, HIV, and morbid obesity presenting with syncope. 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 She then developed hot and cold sweats and significant pelvic cramping typical of her menstrual periods. She went to the bathroom and noted that she was having some bleeding. When she was exiting the stall, she became very lightheaded and had a brief syncopal episode. She woke up on the floor without any obvious injury and has no pain. She denies any associated chest pain, palpitations, numbness tingling or weakness. She has not had any recent illness, including fever or chills, nausea vomiting or diarrhea. The patient has had syncope due to significant pelvic cramping and passed, but has never been evaluated by a gynecologist for dysmenorrhea. Other than mild pelvic cramping, the patient feels back to normal at this time.  FH: No history of sudden cardiac death. She has a sister who died at age 53 with significant complications of morbid obesity and oxygen requirement.   Past Medical History:  Diagnosis Date  . Asthma   . Bipolar 1 disorder (HCC)   . Drug abuse   . Hepatitis B   . HIV (human immunodeficiency virus infection) (HCC)   . Obese   . Restless leg syndrome   . Schizophrenia [facility])     Patient Active Problem List   Diagnosis Date Noted  . HIV DISEASE 04/27/2008    Past Surgical History:  Procedure Laterality Date  . HERNIA REPAIR    . TUBAL LIGATION    . TYMPANOSTOMY TUBE PLACEMENT      Current Outpatient Rx   . Order #: [id number] Class: Historical Med  . Order #: [id number] Class: Print  . Order #: [id number] Class: Print  . Order #: [id number] Class: Print  . Order #: [id number] Class: Print  . Order #: [id number] Class: Print  . Order #: [id number] Class: Print  . Order #: [id number] Class: Print    Allergies Aspirin  Family History  Problem Relation Age of Onset  . Hypertension      Social History Social History  Substance Use Topics  . Smoking status: Former Smoker    Packs/day: 0.05    Types: Cigarettes    Quit date: 11/26/2007  . Smokeless tobacco: Never Used  . Alcohol use Yes     Comment: occ    Review of Systems Constitutional: No fever/chills.Positive lightheadedness. Positive syncope. No associated injury with the fall. Eyes: No visual changes. No blurred or double vision. ENT: No sore throat. No congestion or rhinorrhea. Cardiovascular: Denies chest pain. Denies palpitations. Respiratory: Denies shortness of breath.  No cough. Gastrointestinal: No abdominal pain.  No nausea, no vomiting.  No diarrhea.  No constipation. Genitourinary: Negative for dysuria. Positive vaginal bleeding typical menstruation with associated typical menstrual cramping. Musculoskeletal: Negative for back pain. Skin: Negative for rash. Neurological: Negative for headaches. No focal numbness, tingling or weakness. No difficulty walking.  10-point ROS otherwise negative.  ____________________________________________   PHYSICAL EXAM:  VITAL SIGNS: ED Triage Vitals [03/27/17 0849]  Enc Vitals Group     BP 134/87     Pulse Rate 73     Resp 16  Temp 97.8 F (36.6 C)     Temp Source Oral     SpO2 95 %     Weight 300 lb (136.1 kg)     Height 5\' 7"  (1.702 m)     Head Circumference      Peak Flow      Pain Score 0     Pain Loc      Pain Edu?      Excl. in GC?     Constitutional: Alert and oriented. Well appearing and in no acute distress. Answers questions appropriately. Eyes:  Conjunctivae are normal.  EOMI. No scleral icterus. Head: Atraumatic. Nose: No congestion/rhinnorhea. Mouth/Throat: Mucous membranes are moist.  Neck: No stridor.  Supple.  No meningismus. No JVD. Cardiovascular: Normal rate, regular rhythm. No murmurs, rubs or gallops.  Respiratory: Normal respiratory effort.  No accessory muscle use or retractions. Lungs CTAB.  No wheezes, rales or ronchi. Gastrointestinal: Morbidly obese. Soft, and nondistended.  Minimal tenderness to palpation in the suprapubic region. No guarding or rebound.  No peritoneal signs. Musculoskeletal: No LE edema. No ttp in the calves or palpable cords.  Negative Homan's sign. Neurologic:  A&Ox3.  Speech is clear.  Face and smile are symmetric.  EOMI.  Moves all extremities well. Skin:  Skin is warm, dry and intact. No rash noted. Psychiatric: Mood and affect are normal. Speech and behavior are normal.  Normal judgement.  ____________________________________________   LABS (all labs ordered are listed, but only abnormal results are displayed)  Labs Reviewed  BASIC METABOLIC PANEL - Abnormal; Notable for the following:       Result Value   Glucose, Bld 115 (*)    All other components within normal limits  CBC - Abnormal; Notable for the following:    MCV 78.4 (*)    MCH 25.6 (*)    RDW 14.6 (*)    All other components within normal limits  URINALYSIS, COMPLETE (UACMP) WITH MICROSCOPIC  POC URINE PREG, ED  POCT PREGNANCY, URINE   ____________________________________________  EKG  ED ECG REPORT I, Rockne Menghini, the attending physician, personally viewed and interpreted this ECG.   Date: 03/27/2017  EKG Time: 847  Rate: 69  Rhythm: normal sinus rhythm  Axis: normal  Intervals:none  ST&T Change: No STEMI. Nonspecific T-wave inversion in V1. No evidence of prolonged QTC. No evidence of Brugada syndrome. No evidence of hypertrophy.  ____________________________________________  RADIOLOGY  Dg Chest  2 View  Result Date: 03/27/2017 CLINICAL DATA:  Syncope. EXAM: CHEST  2 VIEW COMPARISON:  Radiographs of March 17, 2017. FINDINGS: The heart size and mediastinal contours are within normal limits. Both lungs are clear. No pneumothorax or pleural effusion is noted. The visualized skeletal structures are unremarkable. IMPRESSION: No active cardiopulmonary disease. Electronically Signed   By: Lupita Raider, M.D.   On: 03/27/2017 10:05    ____________________________________________   PROCEDURES  Procedure(s) performed: None  Procedures  Critical Care performed: No ____________________________________________   INITIAL IMPRESSION / ASSESSMENT AND PLAN / ED COURSE  Pertinent labs & imaging results that were available during my care of the patient were reviewed by me and considered in my medical decision making (see chart for details).  31 y.o. female with a history of dysmenorrhea presenting with syncopal episode in the setting of pelvic cramping and vaginal bleeding. Overall, the patient's vital signs are reassuring and other than some suprapubic discomfort, her examination is reassuring. We'll get basic blood work to rule out anemia, electrolyte abnormality. Her EKG  does not show any evidence of ischemia, arrhythmia, Brugada syndrome or prolonged QTC or hypertrophy. We'll get a chest x-ray, orthostatics, UA, pregnancy test. If the patient continues to feel well, workup in the emergency department is reassuring, I do anticipate the patient will be able to be discharged.  ----------------------------------------- 10:21 AM on 03/27/2017 -----------------------------------------  The patient has remained hemodynamically stable in the emergency department. She is asymptomatic at this time. She is able to ambulate, and drink without any difficulty. Her workup is reassuring at this time the patient is safe for discharge. Return precautions as well as follow-up instructions were  discussed.  ____________________________________________  FINAL CLINICAL IMPRESSION(S) / ED DIAGNOSES  Final diagnoses:  Syncope, unspecified syncope type  Dysmenorrhea         NEW MEDICATIONS STARTED DURING THIS VISIT:  New Prescriptions   IBUPROFEN (ADVIL,MOTRIN) 800 MG TABLET    Take 1 tablet (800 mg total) by mouth every 8 (eight) hours as needed.      Rockne Menghini, MD 03/27/17 1021

## 2017-03-27 NOTE — ED Triage Notes (Signed)
Patient presents to ED via [facility] from work where she had a syncopal episode. Hx of same. Patient unable to state when the last episode was. A&O x4 at this time. GCS 15. EMS reports CBG 99. Patient also c/o menstrual cramps. Patient was in the bathroom at work. States "I stood up and fell. Everything went black". Patient denies hitting head.

## 2017-03-27 NOTE — Discharge Instructions (Signed)
Please drink plenty of fluids stay well-hydrated. You may take Motrin for your pelvic cramping. Please make an appointment with a gynecologist to discuss your heavy painful periods.  Return to the emergency department if you develop severe pain, fainting, chest pain, palpitations, fever, or any other symptoms concerning to you.

## 2017-08-04 ENCOUNTER — Encounter: Payer: Self-pay | Admitting: Emergency Medicine

## 2017-08-04 ENCOUNTER — Emergency Department
Admission: EM | Admit: 2017-08-04 | Discharge: 2017-08-04 | Disposition: A | Discharge status: Home / Self Care | Payer: Managed Care, Other (non HMO) | Attending: Emergency Medicine | Admitting: Emergency Medicine

## 2017-08-04 DIAGNOSIS — J45909 Unspecified asthma, uncomplicated: Secondary | ICD-10-CM | POA: Diagnosis not present

## 2017-08-04 DIAGNOSIS — Z87891 Personal history of nicotine dependence: Secondary | ICD-10-CM | POA: Insufficient documentation

## 2017-08-04 DIAGNOSIS — K0889 Other specified disorders of teeth and supporting structures: Secondary | ICD-10-CM | POA: Insufficient documentation

## 2017-08-04 DIAGNOSIS — H6981 Other specified disorders of Eustachian tube, right ear: Secondary | ICD-10-CM | POA: Diagnosis not present

## 2017-08-04 DIAGNOSIS — Z79899 Other long term (current) drug therapy: Secondary | ICD-10-CM | POA: Diagnosis not present

## 2017-08-04 MED ORDER — LIDOCAINE VISCOUS 2 % MT SOLN
15.0000 mL | Freq: Once | OROMUCOSAL | Status: AC
  Administered 2017-08-04: 15 mL via OROMUCOSAL
  Filled 2017-08-04: qty 15

## 2017-08-04 MED ORDER — TRAMADOL HCL 50 MG PO TABS: 50.0000 mg | ORAL_TABLET | Freq: Four times a day (QID) | ORAL | 0 refills | Status: DC | PRN

## 2017-08-04 MED ORDER — PSEUDOEPHEDRINE HCL ER 120 MG PO TB12: 120.0000 mg | ORAL_TABLET | Freq: Two times a day (BID) | ORAL | 2 refills | Status: DC | PRN

## 2017-08-04 NOTE — ED Triage Notes (Signed)
Presents with right ear pain for a couple of days   Then developed tooth pain last pm  With some gumline swelling

## 2017-08-04 NOTE — ED Provider Notes (Signed)
[facility] Emergency Department Provider Note   ____________________________________________   First MD Initiated Contact with Patient 08/04/17 1056     (approximate)  I have reviewed the triage vital signs and the nursing notes.     HISTORY  Chief Complaint Dental Pain and Otalgia    HPI Penny Young is a 31 y.o. female patient presents with right ear and dental pain 2 days. Patient describes ear pain as pressure with mild hearing loss. She rates pain as 4/10. Patient described dental pain as "aching" and rates it as a 8/10. No palliative measures for complaint.  Past Medical History:  Diagnosis Date  . Asthma   . Bipolar 1 disorder (HCC)   . Drug abuse   . Hepatitis B   . HIV (human immunodeficiency virus infection) (HCC)   . Obese   . Restless leg syndrome   . Schizophrenia [facility])     Patient Active Problem List   Diagnosis Date Noted  . HIV DISEASE 04/27/2008    Past Surgical History:  Procedure Laterality Date  . HERNIA REPAIR    . TUBAL LIGATION    . TYMPANOSTOMY TUBE PLACEMENT      Prior to Admission medications   Medication Sig Start Date End Date Taking? Authorizing Provider  acetaminophen (TYLENOL) 500 MG tablet Take 1,500 mg by mouth every 6 (six) hours as needed (for pain.).     [provider]  cyclobenzaprine (FLEXERIL) 10 MG tablet Take 1 tablet (10 mg total) by mouth 3 (three) times daily as needed. Patient not taking: Reported on 03/27/2017 12/05/16   Joni Reining, PA-C  fluticasone [facility]) 50 MCG/ACT nasal spray Place 2 sprays into both nostrils daily. 03/17/17 03/17/18  Enid Derry, PA-C  ibuprofen (ADVIL,MOTRIN) 800 MG tablet Take 1 tablet (800 mg total) by mouth every 8 (eight) hours as needed. 03/27/17   Rockne Menghini, MD  meclizine (ANTIVERT) 25 MG tablet Take 1 tablet (25 mg total) by mouth 3 (three) times daily as needed for dizziness. Patient not taking: Reported on 03/27/2017 06/29/15   Azalia Bilis, MD  naproxen (NAPROSYN) 500 MG tablet Take 1 tablet (500 mg total) by mouth 2 (two) times daily with a meal. Patient not taking: Reported on 03/27/2017 06/27/15   Garlon Hatchet, PA-C  ondansetron (ZOFRAN ODT) 8 MG disintegrating tablet Take 1 tablet (8 mg total) by mouth every 8 (eight) hours as needed for nausea or vomiting. Patient not taking: Reported on 03/27/2017 06/29/15   Azalia Bilis, MD  predniSONE (STERAPRED UNI-PAK 21 TAB) 10 MG (21) TBPK tablet Take 6 tablets on day 1, take 5 tablets on day 2, take 4 tablets on day 3, take 3 tablets on day 4, take 2 tablets on day 5, take 1 tablet on day 6 Patient not taking: Reported on 03/27/2017 03/17/17   Enid Derry, PA-C  pseudoephedrine (SUDAFED) 120 MG 12 hr tablet Take 1 tablet (120 mg total) by mouth 2 (two) times daily as needed for congestion. 08/04/17 08/04/18  Joni Reining, PA-C  traMADol (ULTRAM) 50 MG tablet Take 1 tablet (50 mg total) by mouth every 6 (six) hours as needed for moderate pain. 08/04/17   Joni Reining, PA-C    Allergies Aspirin  Family History  Problem Relation Age of Onset  . Hypertension Unknown     Social History Social History  Substance Use Topics  . Smoking status: Former Smoker    Packs/day: 0.05    Types: Cigarettes  Quit date: 11/26/2007  . Smokeless tobacco: Never Used  . Alcohol use Yes     Comment: occ    Review of Systems  Constitutional: No fever/chills Eyes: No visual changes. ENT: No sore throat. Cardiovascular: Denies chest pain. Respiratory: Denies shortness of breath. Gastrointestinal: No abdominal pain.  No nausea, no vomiting.  No diarrhea.  No constipation. Genitourinary: Negative for dysuria. Musculoskeletal: Negative for back pain. Skin: Negative for rash. Neurological: Negative for headaches, focal weakness or numbness. Psychiatric:Bipolar and schizophrenia. Endocrine:Hepatitis B Hematological/Lymphatic: Allergic/Immunilogical: HIV.  ____________________________________________   PHYSICAL EXAM:  VITAL SIGNS: ED Triage Vitals  Enc Vitals Group     BP 08/04/17 1052 119/70     Pulse Rate 08/04/17 1052 87     Resp 08/04/17 1052 16     Temp 08/04/17 1052 98.5 F (36.9 C)     Temp Source 08/04/17 1052 Oral     SpO2 08/04/17 1052 99 %     Weight 08/04/17 1055 290 lb (131.5 kg)     Height 08/04/17 1055 5\' 7"  (1.702 m)     Head Circumference --      Peak Flow --      Pain Score 08/04/17 1056 8     Pain Loc --      Pain Edu? --      Excl. in GC? --     Constitutional: Alert and oriented. Well appearing and in no acute distress. Morbid obesity Ears; right knee tenderness but not erythematous TM. Mouth/Throat: Mucous membranes are moist.  Oropharynx non-erythematous.  Neck: No stridor.  Hematological/Lymphatic/Immunilogical: No cervical lymphadenopathy.  devitalized tooth #20 which indicate edema Cardiovascular: Normal rate, regular rhythm. Grossly normal heart sounds.  Good peripheral circulation. Respiratory: Normal respiratory effort.  No retractions. Lungs CTAB. Neurologic:  Normal speech and language. No gross focal neurologic deficits are appreciated. No gait instability. Skin:  Skin is warm, dry and intact. No rash noted. Psychiatric: Mood and affect are normal. Speech and behavior are normal.  ____________________________________________   LABS (all labs ordered are listed, but only abnormal results are displayed)  Labs Reviewed - No data to display ____________________________________________  EKG   ____________________________________________  RADIOLOGY  No results found.  ____________________________________________   PROCEDURES  Procedure(s) performed: None  Procedures  Critical Care performed: No  ____________________________________________   INITIAL IMPRESSION / ASSESSMENT AND PLAN / ED COURSE  Pertinent labs & imaging results that were available during my care of the  patient were reviewed by me and considered in my medical decision making (see chart for details).  Right eustachian tube dysfunction and dental pain. Patient given discharge Instructions and advised to report to the walk-in dental clinic today.      ____________________________________________   FINAL CLINICAL IMPRESSION(S) / ED DIAGNOSES  Final diagnoses:  Pain, dental  Eustachian tube dysfunction, right      NEW MEDICATIONS STARTED DURING THIS VISIT:  Discharge Medication List as of 08/04/2017 11:03 AM    START taking these medications   Details  pseudoephedrine (SUDAFED) 120 MG 12 hr tablet Take 1 tablet (120 mg total) by mouth 2 (two) times daily as needed for congestion., Starting Mon 08/04/2017, Until Tue 08/04/2018, Print    traMADol (ULTRAM) 50 MG tablet Take 1 tablet (50 mg total) by mouth every 6 (six) hours as needed for moderate pain., Starting Mon 08/04/2017, Print         Note:  This document was prepared using Dragon voice recognition software and may include unintentional dictation errors.  Joni Reining, PA-C 08/04/17 1150    Jene Every, MD 08/04/17 [phone]

## 2017-10-07 ENCOUNTER — Ambulatory Visit: Payer: Managed Care, Other (non HMO) | Admitting: Gastroenterology

## 2017-12-01 ENCOUNTER — Emergency Department
Admission: EM | Admit: 2017-12-01 | Discharge: 2017-12-01 | Disposition: A | Discharge status: Home / Self Care | Payer: Managed Care, Other (non HMO) | Attending: Emergency Medicine | Admitting: Emergency Medicine

## 2017-12-01 ENCOUNTER — Other Ambulatory Visit: Payer: Self-pay

## 2017-12-01 ENCOUNTER — Encounter: Payer: Self-pay | Admitting: Emergency Medicine

## 2017-12-01 DIAGNOSIS — B20 Human immunodeficiency virus [HIV] disease: Secondary | ICD-10-CM | POA: Insufficient documentation

## 2017-12-01 DIAGNOSIS — J45909 Unspecified asthma, uncomplicated: Secondary | ICD-10-CM | POA: Diagnosis not present

## 2017-12-01 DIAGNOSIS — Z79899 Other long term (current) drug therapy: Secondary | ICD-10-CM | POA: Insufficient documentation

## 2017-12-01 DIAGNOSIS — R51 Headache: Secondary | ICD-10-CM | POA: Diagnosis not present

## 2017-12-01 DIAGNOSIS — R05 Cough: Secondary | ICD-10-CM | POA: Insufficient documentation

## 2017-12-01 DIAGNOSIS — H9201 Otalgia, right ear: Secondary | ICD-10-CM | POA: Diagnosis not present

## 2017-12-01 DIAGNOSIS — R519 Headache, unspecified: Secondary | ICD-10-CM

## 2017-12-01 DIAGNOSIS — F17210 Nicotine dependence, cigarettes, uncomplicated: Secondary | ICD-10-CM | POA: Insufficient documentation

## 2017-12-01 MED ORDER — IBUPROFEN 600 MG PO TABS: 600.0000 mg | ORAL_TABLET | Freq: Four times a day (QID) | ORAL | 0 refills | Status: DC | PRN

## 2017-12-01 MED ORDER — KETOROLAC TROMETHAMINE 30 MG/ML IJ SOLN
30.0000 mg | Freq: Once | INTRAMUSCULAR | Status: AC
  Administered 2017-12-01: 30 mg via INTRAMUSCULAR
  Filled 2017-12-01: qty 1

## 2017-12-01 MED ORDER — PREDNISONE 10 MG PO TABS: ORAL_TABLET | ORAL | 0 refills | Status: DC

## 2017-12-01 NOTE — ED Triage Notes (Addendum)
R earache x 2 days. 

## 2017-12-01 NOTE — ED Notes (Signed)
See triage note  States she developed pain to right ear and right side of face couple of days ago  Possible fever  afebrile

## 2017-12-01 NOTE — ED Provider Notes (Signed)
[facility] Emergency Department Provider Note  ____________________________________________  Time seen: Approximately 12:28 PM  I have reviewed the triage vital signs and the nursing notes.   HISTORY  Chief Complaint Otalgia    HPI Penny Young is a 32 y.o. female that presents to the emergency department for evaluation of right-sided facial pain for 2 days.  Patient states it feels like something is draining down the side of her face/throat and into her neck.  She has a mild nonproductive cough.  She has chronic right ear humming.  She works at [employer] and is around sick people frequently.  She has taken Tylenol for symptoms.  She denies fever, headache, visual changes, eye pain, hearing changes, ear pain, sinus tenderness, nasal congestion or drainage, dental pain, sore throat, shortness of breath, chest pain, nausea, vomiting, abdominal pain.  Past Medical History:  Diagnosis Date  . Asthma   . Bipolar 1 disorder (HCC)   . Drug abuse (HCC)   . Hepatitis B   . HIV (human immunodeficiency virus infection) (HCC)   . Obese   . Restless leg syndrome   . Schizophrenia [facility])     Patient Active Problem List   Diagnosis Date Noted  . HIV DISEASE 04/27/2008    Past Surgical History:  Procedure Laterality Date  . HERNIA REPAIR    . TUBAL LIGATION    . TYMPANOSTOMY TUBE PLACEMENT      Prior to Admission medications   Medication Sig Start Date End Date Taking? Authorizing Provider  acetaminophen (TYLENOL) 500 MG tablet Take 1,500 mg by mouth every 6 (six) hours as needed (for pain.).     [provider]  cyclobenzaprine (FLEXERIL) 10 MG tablet Take 1 tablet (10 mg total) by mouth 3 (three) times daily as needed. Patient not taking: Reported on 03/27/2017 12/05/16   Joni Reining, PA-C  fluticasone [facility]) 50 MCG/ACT nasal spray Place 2 sprays into both nostrils daily. 03/17/17 03/17/18  Enid Derry, PA-C  ibuprofen (ADVIL,MOTRIN) 600 MG  tablet Take 1 tablet (600 mg total) by mouth every 6 (six) hours as needed. 12/01/17   Enid Derry, PA-C  meclizine (ANTIVERT) 25 MG tablet Take 1 tablet (25 mg total) by mouth 3 (three) times daily as needed for dizziness. Patient not taking: Reported on 03/27/2017 06/29/15   Azalia Bilis, MD  naproxen (NAPROSYN) 500 MG tablet Take 1 tablet (500 mg total) by mouth 2 (two) times daily with a meal. Patient not taking: Reported on 03/27/2017 06/27/15   Garlon Hatchet, PA-C  ondansetron (ZOFRAN ODT) 8 MG disintegrating tablet Take 1 tablet (8 mg total) by mouth every 8 (eight) hours as needed for nausea or vomiting. Patient not taking: Reported on 03/27/2017 06/29/15   Azalia Bilis, MD  predniSONE (DELTASONE) 10 MG tablet Take 6 tablets on day 1, take 5 tablets on day 2, take 4 tablets on day 3, take 3 tablets on day 4, take 2 tablets on day 5, take 1 tablet on day 6 12/01/17   Enid Derry, PA-C  pseudoephedrine (SUDAFED) 120 MG 12 hr tablet Take 1 tablet (120 mg total) by mouth 2 (two) times daily as needed for congestion. 08/04/17 08/04/18  Joni Reining, PA-C  traMADol (ULTRAM) 50 MG tablet Take 1 tablet (50 mg total) by mouth every 6 (six) hours as needed for moderate pain. 08/04/17   Joni Reining, PA-C    Allergies Aspirin  Family History  Problem Relation Age of Onset  . Hypertension  Unknown     Social History Social History   Tobacco Use  . Smoking status: Current Every Day Smoker    Packs/day: 0.10    Types: Cigarettes    Last attempt to quit: 11/26/2007    Years since quitting: 10.0  . Smokeless tobacco: Never Used  Substance Use Topics  . Alcohol use: Yes    Comment: occ  . Drug use: Yes    Types: Marijuana    Comment: past abuse     Review of Systems  Constitutional: No fever/chills Cardiovascular: No chest pain. Respiratory: No SOB. Gastrointestinal: No abdominal pain.  No nausea, no vomiting.  Musculoskeletal: Negative for musculoskeletal pain. Skin: Negative for rash,  abrasions, lacerations, ecchymosis. Neurological: Negative for headaches   ____________________________________________   PHYSICAL EXAM:  VITAL SIGNS: ED Triage Vitals  Enc Vitals Group     BP 12/01/17 1127 131/62     Pulse Rate 12/01/17 1127 91     Resp 12/01/17 1127 20     Temp 12/01/17 1127 98.6 F (37 C)     Temp Source 12/01/17 1127 Oral     SpO2 12/01/17 1127 100 %     Weight 12/01/17 1130 290 lb (131.5 kg)     Height 12/01/17 1130 5\' 7"  (1.702 m)     Head Circumference --      Peak Flow --      Pain Score 12/01/17 1127 8     Pain Loc --      Pain Edu? --      Excl. in GC? --      Constitutional: Alert and oriented. Well appearing and in no acute distress. Eyes: Conjunctivae are normal. PERRL. EOMI. Head:  ENT: No frontal or maxillary sinus tenderness.  No tenderness over temporals.      Ears: Tympanic membranes are pearly.      Nose: No congestion/rhinnorhea.      Mouth/Throat: Mucous membranes are moist.  Oropharynx nonerythematous.  Tonsils not enlarged.  No tenderness to palpation around teeth. Neck: No stridor.   Cardiovascular: Normal rate, regular rhythm.  Good peripheral circulation. Respiratory: Normal respiratory effort without tachypnea or retractions. Lungs CTAB. Good air entry to the bases with no decreased or absent breath sounds. Musculoskeletal: Full range of motion to all extremities. No gross deformities appreciated. Neurologic:  Normal speech and language. No gross focal neurologic deficits are appreciated.  Skin:  Skin is warm, dry and intact. No rash noted.   ____________________________________________   LABS (all labs ordered are listed, but only abnormal results are displayed)  Labs Reviewed - No data to display ____________________________________________  EKG   ____________________________________________  RADIOLOGY   No results found.  ____________________________________________    PROCEDURES  Procedure(s)  performed:    Procedures    Medications  ketorolac (TORADOL) 30 MG/ML injection 30 mg (30 mg Intramuscular Given 12/01/17 1251)   ____________________________________________   INITIAL IMPRESSION / ASSESSMENT AND PLAN / ED COURSE  Pertinent labs & imaging results that were available during my care of the patient were reviewed by me and considered in my medical decision making (see chart for details).  Review of the [place] CSRS was performed in accordance of the [facility] prior to dispensing any controlled drugs.  Patient presented to the emergency department for evaluation of right-sided facial pain.  Vital signs and exam are reassuring. Exam is unremarkable. Patient denies headache. No indication of GCA, ear infection, sinusitis, pharyngitis, tonsillitis.  Symptoms almost completely resolved with Toradol.  Patient will be  discharged home with prescriptions for prednisone and ibuprofen for inflammation of sinuses. Patient is to follow up with PCP as directed. Patient is given ED precautions to return to the ED for any worsening or new symptoms.     ____________________________________________  FINAL CLINICAL IMPRESSION(S) / ED DIAGNOSES  Final diagnoses:  Facial pain      NEW MEDICATIONS STARTED DURING THIS VISIT:  ED Discharge Orders        Ordered    predniSONE (DELTASONE) 10 MG tablet     12/01/17 1321    ibuprofen (ADVIL,MOTRIN) 600 MG tablet  Every 6 hours PRN     12/01/17 1321          This chart was dictated using voice recognition software/Dragon. Despite best efforts to proofread, errors can occur which can change the meaning. Any change was purely unintentional.    Enid Derry, PA-C 12/01/17 1750    Emily Filbert, MD 12/02/17 [phone]

## 2018-02-10 ENCOUNTER — Other Ambulatory Visit: Payer: Self-pay

## 2018-02-10 ENCOUNTER — Emergency Department
Admission: EM | Admit: 2018-02-10 | Discharge: 2018-02-10 | Disposition: A | Discharge status: Home / Self Care | Payer: Managed Care, Other (non HMO) | Attending: Emergency Medicine | Admitting: Emergency Medicine

## 2018-02-10 ENCOUNTER — Emergency Department: Payer: Managed Care, Other (non HMO)

## 2018-02-10 ENCOUNTER — Encounter: Payer: Self-pay | Admitting: Emergency Medicine

## 2018-02-10 DIAGNOSIS — M19071 Primary osteoarthritis, right ankle and foot: Secondary | ICD-10-CM | POA: Diagnosis not present

## 2018-02-10 DIAGNOSIS — B20 Human immunodeficiency virus [HIV] disease: Secondary | ICD-10-CM | POA: Diagnosis not present

## 2018-02-10 DIAGNOSIS — F17210 Nicotine dependence, cigarettes, uncomplicated: Secondary | ICD-10-CM | POA: Insufficient documentation

## 2018-02-10 DIAGNOSIS — M25571 Pain in right ankle and joints of right foot: Secondary | ICD-10-CM | POA: Diagnosis present

## 2018-02-10 DIAGNOSIS — J45909 Unspecified asthma, uncomplicated: Secondary | ICD-10-CM | POA: Insufficient documentation

## 2018-02-10 MED ORDER — IBUPROFEN 600 MG PO TABS
600.0000 mg | ORAL_TABLET | Freq: Once | ORAL | Status: AC
Start: 2018-02-10 — End: 2018-02-10
  Administered 2018-02-10: 600 mg via ORAL
  Filled 2018-02-10: qty 1

## 2018-02-10 MED ORDER — NAPROXEN 500 MG PO TABS: 500.0000 mg | ORAL_TABLET | Freq: Two times a day (BID) | ORAL | 0 refills | Status: DC

## 2018-02-10 NOTE — ED Triage Notes (Signed)
Pt to ED via POV, pt ambulatory to triage c/o right ankle pain x 1 week. Pt states that over the past few days it has become harder to walk on her ankle. Pt states that she has had no new injury to the ankle. Pt in NAD at this time.

## 2018-02-10 NOTE — Discharge Instructions (Signed)
Follow-up with Dr. Orland Jarred who is the podiatrist on call today.  You will need to call his office to make an appointment.  His contact information is listed on your discharge papers.  Begin taking naproxen 500 mg twice daily with food.

## 2018-02-10 NOTE — ED Notes (Signed)
See triage note  Presents with pain to right ankle for about 1 week  Denies any injury   No deformity or swelling noted  Good pulses  States pain is mainly at ankle and posterior foot

## 2018-02-10 NOTE — ED Provider Notes (Signed)
[facility] Emergency Department Provider Note  ____________________________________________   First MD Initiated Contact with Patient 02/10/18 [phone]     (approximate)  I have reviewed the triage vital signs and the nursing notes.   HISTORY  Chief Complaint Ankle Pain   HPI Penny Young is a 32 y.o. female is here with complaint of posterior ankle pain that began last evening.  Patient states she has been in pain for 1 week and is not take any over-the-counter medications other than occasional Tylenol with minimal to no relief at all.  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  She denies any recent injury to her ankle.  She rates her pain as 9 out of 10.   Past Medical History:  Diagnosis Date  . Asthma   . Bipolar 1 disorder (HCC)   . Drug abuse (HCC)   . Hepatitis B   . HIV (human immunodeficiency virus infection) (HCC)   . Obese   . Restless leg syndrome   . Schizophrenia [facility])     Patient Active Problem List   Diagnosis Date Noted  . HIV DISEASE 04/27/2008    Past Surgical History:  Procedure Laterality Date  . HERNIA REPAIR    . TUBAL LIGATION    . TYMPANOSTOMY TUBE PLACEMENT      Prior to Admission medications   Medication Sig Start Date End Date Taking? Authorizing Provider  acetaminophen (TYLENOL) 500 MG tablet Take 1,500 mg by mouth every 6 (six) hours as needed (for pain.).     [provider]  naproxen (NAPROSYN) 500 MG tablet Take 1 tablet (500 mg total) by mouth 2 (two) times daily with a meal. 02/10/18   Tommi Rumps, PA-C    Allergies Aspirin  Family History  Problem Relation Age of Onset  . Hypertension Unknown     Social History Social History   Tobacco Use  . Smoking status: Current Every Day Smoker    Packs/day: 0.10    Types: Cigarettes    Last attempt to quit: 11/26/2007    Years since quitting: 10.2  . Smokeless tobacco: Never Used  Substance Use Topics    . Alcohol use: Yes    Comment: occ  . Drug use: Yes    Types: Marijuana    Comment: past abuse    Review of Systems Constitutional: No fever/chills Cardiovascular: Denies chest pain. Respiratory: Denies shortness of breath. Musculoskeletal: Positive for right foot and ankle pain. Skin: Negative for rash. Neurological: Negative for headaches, focal weakness or numbness. ___________________________________________   PHYSICAL EXAM:  VITAL SIGNS: ED Triage Vitals  Enc Vitals Group     BP 02/10/18 0852 (!) 141/87     Pulse Rate 02/10/18 0852 84     Resp 02/10/18 0852 16     Temp 02/10/18 0852 98.7 F (37.1 C)     Temp Source 02/10/18 0852 Oral     SpO2 02/10/18 0852 100 %     Weight 02/10/18 0853 280 lb (127 kg)     Height 02/10/18 0853 5\' 7"  (1.702 m)     Head Circumference --      Peak Flow --      Pain Score 02/10/18 0853 9     Pain Loc --      Pain Edu? --      Excl. in GC? --    Constitutional: Alert and oriented. Well appearing and in no acute distress.  Morbidly obese. Eyes: Conjunctivae are normal.  Head: Atraumatic. Neck: No stridor.   Cardiovascular: Normal rate, regular rhythm. Grossly normal heart sounds.  Good peripheral circulation. Respiratory: Normal respiratory effort.  No retractions. Lungs CTAB. Musculoskeletal: On examination of the right foot there is moderate tenderness on palpation of posterior foot and at the insertion of the Achilles tendon.  There is no gross deformity and no soft tissue swelling.  There is no erythema or warmth.  Range of motion is slightly decreased secondary to discomfort.  Skin is intact and pulse positive.  Motor sensory function intact.  Patient was noted to be limping.  Achilles tendon intact. Neurologic:  Normal speech and language. No gross focal neurologic deficits are appreciated.  Skin:  Skin is warm, dry and intact. No rash noted. Psychiatric: Mood and affect are normal. Speech and behavior are  normal.  ____________________________________________   LABS (all labs ordered are listed, but only abnormal results are displayed)  Labs Reviewed - No data to display  RADIOLOGY  ED MD interpretation:   Right foot x-ray shows degenerative changes with heel spur present.  Official radiology report(s): Dg Foot Complete Right  Result Date: 02/10/2018 CLINICAL DATA:  Right heel pain for 1 week, worsening. No known injury. EXAM: RIGHT FOOT COMPLETE - 3+ VIEW COMPARISON:  Plain films of the right ankle 09/28/2013 FINDINGS: No acute bony or joint abnormality is identified. Degenerative change about the midfoot is worst at the talonavicular joint where it is advanced for age. Small dorsal calcaneal spur is identified. Soft tissues are unremarkable. 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 No acute abnormality. Electronically Signed   By: Drusilla Kanner M.D.   On: 02/10/2018 09:37    ____________________________________________   PROCEDURES  Procedure(s) performed: None  Procedures  Critical Care performed: No  ____________________________________________   INITIAL IMPRESSION / ASSESSMENT AND PLAN / ED COURSE  As part of my medical decision making, I reviewed the following data within the electronic MEDICAL RECORD NUMBER Notes from prior ED visits and [place] Controlled Substance Database  Patient is here with complaint of right foot/ankle pain for 1 week.  No acute injury was seen on x-ray however patient does have a osteo-arthritic changes which most likely is the cause of her pain.  Patient was given ibuprofen 600 mg in the department and given a prescription at discharge for naproxen 500 mg twice daily with food.  She has taken both of these without any difficulty as she has a sensitivity to aspirin.  She was referred to Dr. Orland Jarred the podiatrist on call for further evaluation of her arthritic  foot.  ____________________________________________   FINAL CLINICAL IMPRESSION(S) / ED DIAGNOSES  Final diagnoses:  Osteoarthritis of right foot, unspecified osteoarthritis type     ED Discharge Orders        Ordered    naproxen (NAPROSYN) 500 MG tablet  2 times daily with meals     02/10/18 0950       Note:  This document was prepared using Dragon voice recognition software and may include unintentional dictation errors.    Tommi Rumps, PA-C 02/10/18 1011    Jeanmarie Plant, MD 02/10/18 [phone]

## 2018-06-29 ENCOUNTER — Emergency Department
Admission: EM | Admit: 2018-06-29 | Discharge: 2018-06-29 | Disposition: A | Discharge status: Home / Self Care | Payer: Managed Care, Other (non HMO) | Attending: Emergency Medicine | Admitting: Emergency Medicine

## 2018-06-29 ENCOUNTER — Other Ambulatory Visit: Payer: Self-pay

## 2018-06-29 DIAGNOSIS — R51 Headache: Secondary | ICD-10-CM | POA: Insufficient documentation

## 2018-06-29 DIAGNOSIS — Z79899 Other long term (current) drug therapy: Secondary | ICD-10-CM | POA: Insufficient documentation

## 2018-06-29 DIAGNOSIS — F17210 Nicotine dependence, cigarettes, uncomplicated: Secondary | ICD-10-CM | POA: Insufficient documentation

## 2018-06-29 DIAGNOSIS — R519 Headache, unspecified: Secondary | ICD-10-CM

## 2018-06-29 DIAGNOSIS — J45909 Unspecified asthma, uncomplicated: Secondary | ICD-10-CM | POA: Insufficient documentation

## 2018-06-29 DIAGNOSIS — I10 Essential (primary) hypertension: Secondary | ICD-10-CM | POA: Insufficient documentation

## 2018-06-29 HISTORY — DX: Essential (primary) hypertension: I10

## 2018-06-29 MED ORDER — FLUTICASONE PROPIONATE 50 MCG/ACT NA SUSP: 1.0000 | Freq: Every day | NASAL | 0 refills | Status: DC

## 2018-06-29 MED ORDER — METOCLOPRAMIDE HCL 5 MG/ML IJ SOLN
10.0000 mg | Freq: Once | INTRAMUSCULAR | Status: AC
  Administered 2018-06-29: 10 mg via INTRAMUSCULAR
  Filled 2018-06-29: qty 2

## 2018-06-29 MED ORDER — ACETAMINOPHEN 500 MG PO TABS
1000.0000 mg | ORAL_TABLET | Freq: Once | ORAL | Status: AC
  Administered 2018-06-29: 1000 mg via ORAL
  Filled 2018-06-29: qty 2

## 2018-06-29 MED ORDER — METOCLOPRAMIDE HCL 10 MG PO TABS: 10.0000 mg | ORAL_TABLET | Freq: Three times a day (TID) | ORAL | 0 refills | Status: DC | PRN

## 2018-06-29 MED ORDER — PSEUDOEPHEDRINE HCL 60 MG PO TABS: 60.0000 mg | ORAL_TABLET | Freq: Four times a day (QID) | ORAL | 0 refills | Status: DC | PRN

## 2018-06-29 NOTE — ED Provider Notes (Signed)
[facility] Emergency Department Provider Note ____________________________________________   First MD Initiated Contact with Patient 06/29/18 1114     (approximate)  I have reviewed the triage vital signs and the nursing notes.   HISTORY  Chief Complaint Headache    HPI Penny Young is a 32 y.o. female with PMH as noted below who presents with headache, gradual onset, right-sided and mainly around the right temple, and constant over the last week but intermittent in intensity.  Patient states is worse when she is looking at a television or staring somewhere, and she has slight photophobia, but no photophobia, nausea, or vomiting.  No trauma.  No vision changes.  No prior history of this type headache.  The patient also reports sinus pressure and nasal congestion although no rhinorrhea, fever, or cough.   Past Medical History:  Diagnosis Date  . Asthma   . Bipolar 1 disorder (HCC)   . Drug abuse (HCC)   . Hepatitis B   . HIV (human immunodeficiency virus infection) (HCC)   . Hypertension   . Obese   . Restless leg syndrome   . Schizophrenia [facility])     Patient Active Problem List   Diagnosis Date Noted  . HIV DISEASE 04/27/2008    Past Surgical History:  Procedure Laterality Date  . HERNIA REPAIR    . TUBAL LIGATION    . TYMPANOSTOMY TUBE PLACEMENT      Prior to Admission medications   Medication Sig Start Date End Date Taking? Authorizing Provider  amLODipine (NORVASC) 5 MG tablet Take 5 mg by mouth daily. 05/19/18  Yes [provider]  hydrochlorothiazide (HYDRODIURIL) 12.5 MG tablet Take 12.5 mg by mouth daily. 05/07/18  Yes [provider]  ranitidine (ZANTAC) 150 MG tablet Take 150 mg by mouth 2 (two) times daily. 05/07/18  Yes [provider]  acetaminophen (TYLENOL) 500 MG tablet Take 1,500 mg by mouth every 6 (six) hours as needed (for pain.).     [provider]  fluticasone (FLONASE) 50 MCG/ACT nasal  spray Place 1 spray into both nostrils daily. 06/29/18   Dionne Bucy, MD  metoCLOPramide (REGLAN) 10 MG tablet Take 1 tablet (10 mg total) by mouth every 8 (eight) hours as needed for up to 5 days (Headache). 06/29/18 07/04/18  Dionne Bucy, MD  naproxen (NAPROSYN) 500 MG tablet Take 1 tablet (500 mg total) by mouth 2 (two) times daily with a meal. Patient not taking: Reported on 06/29/2018 02/10/18   Tommi Rumps, PA-C  pseudoephedrine (SUDAFED) 60 MG tablet Take 1 tablet (60 mg total) by mouth every 6 (six) hours as needed for congestion. 06/29/18   Dionne Bucy, MD    Allergies Aspirin  Family History  Problem Relation Age of Onset  . Hypertension Unknown     Social History Social History   Tobacco Use  . Smoking status: Current Every Day Smoker    Packs/day: 0.10    Types: Cigarettes    Last attempt to quit: 11/26/2007    Years since quitting: 10.5  . Smokeless tobacco: Never Used  Substance Use Topics  . Alcohol use: Yes    Comment: occ  . Drug use: Yes    Types: Marijuana    Comment: past abuse    Review of Systems  Constitutional: No fever. Eyes: No visual changes.  Mild photophobia. ENT: No neck pain. Cardiovascular: Denies chest pain. Respiratory: Denies shortness of breath. Gastrointestinal: No vomiting. Genitourinary: Negative for flank pain.  Musculoskeletal: Negative  for back pain. Skin: Negative for rash. Neurological: Positive for headache.   ____________________________________________   PHYSICAL EXAM:  VITAL SIGNS: ED Triage Vitals [06/29/18 1024]  Enc Vitals Group     BP (!) 150/92     Pulse Rate 91     Resp 18     Temp 99.3 F (37.4 C)     Temp Source Oral     SpO2 97 %     Weight 275 lb (124.7 kg)     Height 5\' 7"  (1.702 m)     Head Circumference      Peak Flow      Pain Score 9     Pain Loc      Pain Edu?      Excl. in GC?     Constitutional: Alert and oriented. Well appearing and in no acute distress. Eyes:  Conjunctivae are normal.  EOMI.  PERRLA. Head: Atraumatic. Nose: No congestion/rhinnorhea. Mouth/Throat: Mucous membranes are moist.  Oropharynx clear. Neck: Normal range of motion.  No meningeal signs. Cardiovascular:  Good peripheral circulation. Respiratory: Normal respiratory effort.  Gastrointestinal: No distention.  Musculoskeletal: Extremities warm and well perfused.  Neurologic:  Normal speech and language.  Motor and sensory intact in all extremities.  Normal coordination.  No gross focal neurologic deficits are appreciated.  Skin:  Skin is warm and dry. No rash noted. Psychiatric: Mood and affect are normal. Speech and behavior are normal.  ____________________________________________   LABS (all labs ordered are listed, but only abnormal results are displayed)  Labs Reviewed - No data to display ____________________________________________  EKG   ____________________________________________  RADIOLOGY    ____________________________________________   PROCEDURES  Procedure(s) performed: No  Procedures  Critical Care performed: No ____________________________________________   INITIAL IMPRESSION / ASSESSMENT AND PLAN / ED COURSE  Pertinent labs & imaging results that were available during my care of the patient were reviewed by me and considered in my medical decision making (see chart for details).  32 year old female with PMH as noted above presents with right-sided headache over the last week, intermittent in intensity not associated with any specific neurologic symptoms.  The patient does have some sinus pressure and congestion.  On exam, she is well-appearing, her vital signs are normal except for hypertension in triage which resolved spontaneously without intervention.  Neuro exam is normal.  The remainder of the exam is as described above.  Overall I suspect most likely tension type headache, migraine, or mild sinusitis.  No evidence for concerning  cause such as SAH or meningitis.  I do not suspect temporal arteritis given the patient's age, overall well appearance, and the description of the pain.  We will treat empirically with Reglan and Tylenol.  There is no indication for imaging at this time.  If the symptoms improve, I will send the patient home with decongestant and nasal spray and plan for PMD follow-up.  ----------------------------------------- 12:57 PM on 06/29/2018 -----------------------------------------  Patient is feeling much better after the Reglan and Tylenol.  She would like to go home.  I counseled the patient on the likely causes of her headache and the plan of care.  I will prescribe a nasal spray and decongestant as well as p.o. Reglan as needed for home.  Return precautions given, and she expresses understanding.  ____________________________________________   FINAL CLINICAL IMPRESSION(S) / ED DIAGNOSES  Final diagnoses:  Acute nonintractable headache, unspecified headache type      NEW MEDICATIONS STARTED DURING THIS VISIT:  New Prescriptions  FLUTICASONE (FLONASE) 50 MCG/ACT NASAL SPRAY    Place 1 spray into both nostrils daily.   METOCLOPRAMIDE (REGLAN) 10 MG TABLET    Take 1 tablet (10 mg total) by mouth every 8 (eight) hours as needed for up to 5 days (Headache).   PSEUDOEPHEDRINE (SUDAFED) 60 MG TABLET    Take 1 tablet (60 mg total) by mouth every 6 (six) hours as needed for congestion.     Note:  This document was prepared using Dragon voice recognition software and may include unintentional dictation errors.    Dionne Bucy, MD 06/29/18 1258

## 2018-06-29 NOTE — ED Notes (Signed)
RN Trula Ore Aware of pt roomed in 12 at this time.

## 2018-06-29 NOTE — Discharge Instructions (Addendum)
Your headache may be caused by sinus inflammation and pressure, or it may be a migraine.  Take the nasal spray and the decongestant over the next several days.  You may take the Reglan (metoclopramide) in addition to Tylenol as needed for headache.  Make an appointment to follow-up with your primary care doctor.  Return to the ER for new, worsening, persistent severe headache, fevers, weakness, vision changes, or any other new or worsening symptoms that concern you.

## 2018-06-29 NOTE — ED Triage Notes (Signed)
Pt c/o right sided HA for the past week states she has been taking her BP meds and tylenol for the pain with no relief. States she does have some sinus congestion.

## 2018-08-19 ENCOUNTER — Emergency Department
Admission: EM | Admit: 2018-08-19 | Discharge: 2018-08-19 | Disposition: A | Discharge status: Home / Self Care | Payer: Self-pay | Attending: Emergency Medicine | Admitting: Emergency Medicine

## 2018-08-19 ENCOUNTER — Emergency Department: Payer: Self-pay

## 2018-08-19 ENCOUNTER — Encounter: Payer: Self-pay | Admitting: Emergency Medicine

## 2018-08-19 ENCOUNTER — Other Ambulatory Visit: Payer: Self-pay

## 2018-08-19 DIAGNOSIS — Z79899 Other long term (current) drug therapy: Secondary | ICD-10-CM | POA: Insufficient documentation

## 2018-08-19 DIAGNOSIS — I10 Essential (primary) hypertension: Secondary | ICD-10-CM | POA: Insufficient documentation

## 2018-08-19 DIAGNOSIS — R200 Anesthesia of skin: Secondary | ICD-10-CM | POA: Insufficient documentation

## 2018-08-19 DIAGNOSIS — M5410 Radiculopathy, site unspecified: Secondary | ICD-10-CM

## 2018-08-19 DIAGNOSIS — J45909 Unspecified asthma, uncomplicated: Secondary | ICD-10-CM | POA: Insufficient documentation

## 2018-08-19 DIAGNOSIS — Z21 Asymptomatic human immunodeficiency virus [HIV] infection status: Secondary | ICD-10-CM | POA: Insufficient documentation

## 2018-08-19 DIAGNOSIS — F17210 Nicotine dependence, cigarettes, uncomplicated: Secondary | ICD-10-CM | POA: Insufficient documentation

## 2018-08-19 DIAGNOSIS — M5412 Radiculopathy, cervical region: Secondary | ICD-10-CM | POA: Insufficient documentation

## 2018-08-19 MED ORDER — CYCLOBENZAPRINE HCL 5 MG PO TABS: ORAL_TABLET | ORAL | 0 refills | Status: DC

## 2018-08-19 MED ORDER — PREDNISONE 10 MG PO TABS: ORAL_TABLET | ORAL | 0 refills | Status: DC

## 2018-08-19 NOTE — ED Triage Notes (Signed)
Patient states she has neck pain on her left side X 1 week, started while you at work.  Works as a Engineer, mining.  States "it's going down my whole arm" indicates left arm, states she can be holding something and not know it.  Hx verified with patient verbally.

## 2018-08-19 NOTE — ED Notes (Signed)
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

## 2018-08-19 NOTE — ED Provider Notes (Signed)
[facility] Emergency Department Provider Note  ____________________________________________  Time seen: Approximately 12:04 PM  I have reviewed the triage vital signs and the nursing notes.   HISTORY  Chief Complaint Shoulder Pain and Neck Pain    HPI Cydni Stemm is a 32 y.o. female presents emergency department for evaluation of left shoulder and arm pain for 1 week.  Pain is worse when she moves her left shoulder.  Pain is worse when she touches her left upper arm.  She occasionally has pain that shoots from her left shoulder down her left arm and numbness in her fingers.  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  No specific injury.  No IV drug use.  She denies no fever, chills, headache, chest pain, shortness of breath, weakness.  Past Medical History:  Diagnosis Date  . Asthma   . Bipolar 1 disorder (HCC)   . Drug abuse (HCC)   . Hepatitis B   . HIV (human immunodeficiency virus infection) (HCC)   . Hypertension   . Obese   . Restless leg syndrome   . Schizophrenia [facility])     Patient Active Problem List   Diagnosis Date Noted  . HIV DISEASE 04/27/2008    Past Surgical History:  Procedure Laterality Date  . HERNIA REPAIR    . TUBAL LIGATION    . TYMPANOSTOMY TUBE PLACEMENT      Prior to Admission medications   Medication Sig Start Date End Date Taking? Authorizing Provider  acetaminophen (TYLENOL) 500 MG tablet Take 1,500 mg by mouth every 6 (six) hours as needed (for pain.).     [provider]  amLODipine (NORVASC) 5 MG tablet Take 5 mg by mouth daily. 05/19/18   [provider]  cyclobenzaprine (FLEXERIL) 5 MG tablet Take 1-2 tablets 3 times daily as needed 08/19/18   Enid Derry, PA-C  fluticasone [facility]) 50 MCG/ACT nasal spray Place 1 spray into both nostrils daily. 06/29/18   Dionne Bucy, MD  hydrochlorothiazide (HYDRODIURIL) 12.5 MG tablet Take 12.5 mg by mouth daily.  05/07/18   [provider]  metoCLOPramide (REGLAN) 10 MG tablet Take 1 tablet (10 mg total) by mouth every 8 (eight) hours as needed for up to 5 days (Headache). 06/29/18 07/04/18  Dionne Bucy, MD  naproxen (NAPROSYN) 500 MG tablet Take 1 tablet (500 mg total) by mouth 2 (two) times daily with a meal. Patient not taking: Reported on 06/29/2018 02/10/18   Tommi Rumps, PA-C  predniSONE (DELTASONE) 10 MG tablet Take 6 tablets on day 1, take 5 tablets on day 2, take 4 tablets on day 3, take 3 tablets on day 4, take 2 tablets on day 5, take 1 tablet on day 6 08/19/18   Enid Derry, PA-C  pseudoephedrine (SUDAFED) 60 MG tablet Take 1 tablet (60 mg total) by mouth every 6 (six) hours as needed for congestion. 06/29/18   Dionne Bucy, MD  ranitidine (ZANTAC) 150 MG tablet Take 150 mg by mouth 2 (two) times daily. 05/07/18   [provider]    Allergies Aspirin  Family History  Problem Relation Age of Onset  . Hypertension Unknown     Social History Social History   Tobacco Use  . Smoking status: Current Every Day Smoker    Packs/day: 0.10    Types: Cigarettes    Last attempt to quit: 11/26/2007    Years since quitting: 10.7  . Smokeless tobacco: Never  Used  Substance Use Topics  . Alcohol use: Yes    Comment: occ  . Drug use: Not Currently    Types: Marijuana    Comment: past abuse     Review of Systems  Constitutional: No fever/chills Cardiovascular: No chest pain. Respiratory:  No SOB. Gastrointestinal: No abdominal pain.  No nausea, no vomiting.  Musculoskeletal: Positive for neck and shoulder pain.   Skin: Negative for rash, abrasions, lacerations, ecchymosis.   ____________________________________________   PHYSICAL EXAM:  VITAL SIGNS: ED Triage Vitals  Enc Vitals Group     BP 08/19/18 1133 127/79     Pulse Rate 08/19/18 1133 92     Resp 08/19/18 1133 16     Temp 08/19/18 1133 98.5 F (36.9 C)     Temp Source 08/19/18 1133 Oral      SpO2 08/19/18 1133 99 %     Weight 08/19/18 1135 230 lb (104.3 kg)     Height 08/19/18 1135 5\' 7"  (1.702 m)     Head Circumference --      Peak Flow --      Pain Score 08/19/18 1134 10     Pain Loc --      Pain Edu? --      Excl. in GC? --      Constitutional: Alert and oriented. Well appearing and in no acute distress. Eyes: Conjunctivae are normal. PERRL. EOMI. Head: Atraumatic. ENT:      Ears:      Nose: No congestion/rhinnorhea.      Mouth/Throat: Mucous membranes are moist.  Neck: No stridor. No cervical spine tenderness to palpation. Tenderness to palpation of left trapezius muscle. Cardiovascular: Normal rate, regular rhythm.  Good peripheral circulation. Respiratory: Normal respiratory effort without tachypnea or retractions. Lungs CTAB. Good air entry to the bases with no decreased or absent breath sounds. Musculoskeletal: Full range of motion to all extremities. No gross deformities appreciated.  Strength equal in upper extremitis bilaterally.  Tenderness to palpation over left biceps.  Patient is grabbing area between shoulder and neck for relief and pain. Neurologic:  Normal speech and language. No gross focal neurologic deficits are appreciated.  Skin:  Skin is warm, dry and intact. No rash noted. Psychiatric: Mood and affect are normal. Speech and behavior are normal. Patient exhibits appropriate insight and judgement.   ____________________________________________   LABS (all labs ordered are listed, but only abnormal results are displayed)  Labs Reviewed - No data to display ____________________________________________  EKG   ____________________________________________  RADIOLOGY Lexine Baton, personally viewed and evaluated these images (plain radiographs) as part of my medical decision making, as well as reviewing the written report by the radiologist.  Dg Cervical Spine 2-3 Views  Result Date: 08/19/2018 CLINICAL DATA:  32 year old female with a  history of left-sided neck pain EXAM: CERVICAL SPINE - 2-3 VIEW COMPARISON:  None. FINDINGS: Cervical Spine: Cervical elements maintain relative anatomic alignment from the level of C1-T1. Unremarkable appearance of the craniocervical junction. No subluxation, anterolisthesis, retrolisthesis. No acute fracture line identified. Vertebral body heights maintained. Disc spaces maintained, without significant disc disease. No significant facet disease. Prevertebral soft tissues within normal limits. Incidental heterotopic ossification at the posterior left rib and transverse process articulation IMPRESSION: Negative for acute fracture or malalignment of the cervical spine. Electronically Signed   By: Gilmer Mor D.O.   On: 08/19/2018 13:26    ____________________________________________    PROCEDURES  Procedure(s) performed:    Procedures    Medications - No  data to display   ____________________________________________   INITIAL IMPRESSION / ASSESSMENT AND PLAN / ED COURSE  Pertinent labs & imaging results that were available during my care of the patient were reviewed by me and considered in my medical decision making (see chart for details).  Review of the [place] CSRS was performed in accordance of the [facility] prior to dispensing any controlled drugs.     Patient's diagnosis is consistent with cervical radiculopathy.  Vital signs and exam are reassuring.  Cervical x-ray negative for acute bony abnormalities.  EKG shows normal sinus rhythm.  Patient cannot take NSAIDs.  Patient will be discharged home with prescriptions for Flexeril and prednisone. Patient is to follow up with primary care as directed. Patient is given ED precautions to return to the ED for any worsening or new symptoms.     ____________________________________________  FINAL CLINICAL IMPRESSION(S) / ED DIAGNOSES  Final diagnoses:  Radiculopathy, unspecified spinal region      NEW MEDICATIONS STARTED DURING THIS  VISIT:  ED Discharge Orders         Ordered    predniSONE (DELTASONE) 10 MG tablet     08/19/18 1333    cyclobenzaprine (FLEXERIL) 5 MG tablet     08/19/18 1333              This chart was dictated using voice recognition software/Dragon. Despite best efforts to proofread, errors can occur which can change the meaning. Any change was purely unintentional.    Enid Derry, PA-C 08/19/18 1513    Emily Filbert, MD 08/20/18 [id number]

## 2018-09-01 ENCOUNTER — Emergency Department
Admission: EM | Admit: 2018-09-01 | Discharge: 2018-09-01 | Disposition: A | Discharge status: Home / Self Care | Payer: Medicaid Other | Attending: Emergency Medicine | Admitting: Emergency Medicine

## 2018-09-01 ENCOUNTER — Encounter: Payer: Self-pay | Admitting: Emergency Medicine

## 2018-09-01 ENCOUNTER — Other Ambulatory Visit: Payer: Self-pay

## 2018-09-01 DIAGNOSIS — F17210 Nicotine dependence, cigarettes, uncomplicated: Secondary | ICD-10-CM | POA: Insufficient documentation

## 2018-09-01 DIAGNOSIS — N946 Dysmenorrhea, unspecified: Secondary | ICD-10-CM | POA: Insufficient documentation

## 2018-09-01 DIAGNOSIS — I10 Essential (primary) hypertension: Secondary | ICD-10-CM | POA: Insufficient documentation

## 2018-09-01 DIAGNOSIS — Z79899 Other long term (current) drug therapy: Secondary | ICD-10-CM | POA: Insufficient documentation

## 2018-09-01 DIAGNOSIS — J45909 Unspecified asthma, uncomplicated: Secondary | ICD-10-CM | POA: Insufficient documentation

## 2018-09-01 MED ORDER — KETOROLAC TROMETHAMINE 30 MG/ML IJ SOLN
30.0000 mg | Freq: Once | INTRAMUSCULAR | Status: AC
  Administered 2018-09-01: 30 mg via INTRAMUSCULAR
  Filled 2018-09-01: qty 1

## 2018-09-01 NOTE — ED Provider Notes (Signed)
[facility] Emergency Department Provider Note   ____________________________________________    I have reviewed the triage vital signs and the nursing notes.   HISTORY  Chief Complaint Vaginal Bleeding     HPI Penny Young is a 32 y.o. female with history as detailed below presents with complaints of menstrual cramping.  Patient reports that she is currently menstruating, this is her usual time.  She has had a tubal ligation.  Denies fevers or chills.  No nausea or vomiting.  Also reports that she had some blood clots today which she feels is unusual for her.   Past Medical History:  Diagnosis Date  . Asthma   . Bipolar 1 disorder (HCC)   . Drug abuse (HCC)   . Hepatitis B   . HIV (human immunodeficiency virus infection) (HCC)   . Hypertension   . Obese   . Restless leg syndrome   . Schizophrenia [facility])     Patient Active Problem List   Diagnosis Date Noted  . HIV DISEASE 04/27/2008    Past Surgical History:  Procedure Laterality Date  . HERNIA REPAIR    . TUBAL LIGATION    . TYMPANOSTOMY TUBE PLACEMENT      Prior to Admission medications   Medication Sig Start Date End Date Taking? Authorizing Provider  acetaminophen (TYLENOL) 500 MG tablet Take 1,500 mg by mouth every 6 (six) hours as needed (for pain.).     [provider]  amLODipine (NORVASC) 5 MG tablet Take 5 mg by mouth daily. 05/19/18   [provider]  cyclobenzaprine (FLEXERIL) 5 MG tablet Take 1-2 tablets 3 times daily as needed 08/19/18   Enid Derry, PA-C  fluticasone [facility]) 50 MCG/ACT nasal spray Place 1 spray into both nostrils daily. 06/29/18   Dionne Bucy, MD  hydrochlorothiazide (HYDRODIURIL) 12.5 MG tablet Take 12.5 mg by mouth daily. 05/07/18   [provider]  metoCLOPramide (REGLAN) 10 MG tablet Take 1 tablet (10 mg total) by mouth every 8 (eight) hours as needed for up to 5 days (Headache). 06/29/18 07/04/18  Dionne Bucy,  MD  naproxen (NAPROSYN) 500 MG tablet Take 1 tablet (500 mg total) by mouth 2 (two) times daily with a meal. Patient not taking: Reported on 06/29/2018 02/10/18   Tommi Rumps, PA-C  predniSONE (DELTASONE) 10 MG tablet Take 6 tablets on day 1, take 5 tablets on day 2, take 4 tablets on day 3, take 3 tablets on day 4, take 2 tablets on day 5, take 1 tablet on day 6 08/19/18   Enid Derry, PA-C  pseudoephedrine (SUDAFED) 60 MG tablet Take 1 tablet (60 mg total) by mouth every 6 (six) hours as needed for congestion. 06/29/18   Dionne Bucy, MD  ranitidine (ZANTAC) 150 MG tablet Take 150 mg by mouth 2 (two) times daily. 05/07/18   [provider]     Allergies Aspirin  Family History  Problem Relation Age of Onset  . Hypertension Unknown     Social History Social History   Tobacco Use  . Smoking status: Current Every Day Smoker    Packs/day: 0.10    Types: Cigarettes    Last attempt to quit: 11/26/2007    Years since quitting: 10.7  . Smokeless tobacco: Never Used  Substance Use Topics  . Alcohol use: Yes    Comment: occ  . Drug use: Not Currently    Types: Marijuana    Comment: past abuse    Review of Systems  Constitutional: No dizziness  h. Gastrointestinal: No abdominal pain.   Genitourinary: Cramping as above Musculoskeletal: Negative for back pain. Skin: Negative for rash.    ____________________________________________   PHYSICAL EXAM:  VITAL SIGNS: ED Triage Vitals  Enc Vitals Group     BP 09/01/18 1146 129/85     Pulse Rate 09/01/18 1146 96     Resp 09/01/18 1146 16     Temp 09/01/18 1146 98.4 F (36.9 C)     Temp Source 09/01/18 1146 Oral     SpO2 09/01/18 1146 100 %     Weight 09/01/18 1147 104.3 kg (230 lb)     Height 09/01/18 1147 1.702 m (5\' 7" )     Head Circumference --      Peak Flow --      Pain Score 09/01/18 1147 8     Pain Loc --      Pain Edu? --      Excl. in GC? --     Constitutional: Alert and oriented.  Eyes:  Conjunctivae are normal.   Nose: No congestion/rhinnorhea. Mouth/Throat: Mucous membranes are moist.    Cardiovascular: Normal rate, regular rhythm. Grossly normal heart sounds.  Good peripheral circulation. Respiratory: Normal respiratory effort.  No retractions. Lungs CTAB. Gastrointestinal: Soft and nontender. No distention.  No CVA tenderness.  Musculoskeletal:  Warm and well perfused Neurologic:  Normal speech and language. No gross focal neurologic deficits are appreciated.  Skin:  Skin is warm, dry and intact. No rash noted. Psychiatric: Mood and affect are normal. Speech and behavior are normal.  ____________________________________________   LABS (all labs ordered are listed, but only abnormal results are displayed)  Labs Reviewed - No data to display ____________________________________________  EKG  None ____________________________________________  RADIOLOGY   ____________________________________________   PROCEDURES  Procedure(s) performed: No  Procedures   Critical Care performed: No ____________________________________________   INITIAL IMPRESSION / ASSESSMENT AND PLAN / ED COURSE  Pertinent labs & imaging results that were available during my care of the patient were reviewed by me and considered in my medical decision making (see chart for details).  Patient well-appearing in no acute distress.  Vital signs unremarkable.  Treated with IM Toradol with significant relief of her cramps.  Recommend outpatient follow-up with GYN    ____________________________________________   FINAL CLINICAL IMPRESSION(S) / ED DIAGNOSES  Final diagnoses:  Dysmenorrhea        Note:  This document was prepared using Dragon voice recognition software and may include unintentional dictation errors.    Jene Every, MD 09/01/18 1353

## 2018-09-01 NOTE — ED Triage Notes (Signed)
Pt reports that she is on her menstral cycle and has been having blood clots and cramping. States that she is not pregnant, and has been taking Pamprin without relief.

## 2019-05-25 ENCOUNTER — Other Ambulatory Visit: Payer: Self-pay

## 2019-05-25 ENCOUNTER — Emergency Department
Admission: EM | Admit: 2019-05-25 | Discharge: 2019-05-25 | Disposition: A | Discharge status: Home / Self Care | Payer: Medicaid Other | Attending: Emergency Medicine | Admitting: Emergency Medicine

## 2019-05-25 ENCOUNTER — Encounter: Payer: Self-pay | Admitting: Emergency Medicine

## 2019-05-25 ENCOUNTER — Emergency Department: Payer: Medicaid Other

## 2019-05-25 DIAGNOSIS — Z21 Asymptomatic human immunodeficiency virus [HIV] infection status: Secondary | ICD-10-CM | POA: Diagnosis not present

## 2019-05-25 DIAGNOSIS — R200 Anesthesia of skin: Secondary | ICD-10-CM | POA: Insufficient documentation

## 2019-05-25 DIAGNOSIS — J45909 Unspecified asthma, uncomplicated: Secondary | ICD-10-CM | POA: Diagnosis not present

## 2019-05-25 DIAGNOSIS — R202 Paresthesia of skin: Secondary | ICD-10-CM | POA: Diagnosis not present

## 2019-05-25 DIAGNOSIS — R42 Dizziness and giddiness: Secondary | ICD-10-CM | POA: Diagnosis present

## 2019-05-25 DIAGNOSIS — F17210 Nicotine dependence, cigarettes, uncomplicated: Secondary | ICD-10-CM | POA: Diagnosis not present

## 2019-05-25 LAB — CBC WITH DIFFERENTIAL/PLATELET
Abs Immature Granulocytes: 0 10*3/uL (ref 0.00–0.07)
Basophils Absolute: 0 10*3/uL (ref 0.0–0.1)
Basophils Relative: 1 %
Eosinophils Absolute: 0.1 10*3/uL (ref 0.0–0.5)
Eosinophils Relative: 3 %
HCT: 37.6 % (ref 36.0–46.0)
Hemoglobin: 12.5 g/dL (ref 12.0–15.0)
Immature Granulocytes: 0 %
Lymphocytes Relative: 61 %
Lymphs Abs: 2.4 10*3/uL (ref 0.7–4.0)
MCH: 26.2 pg (ref 26.0–34.0)
MCHC: 33.2 g/dL (ref 30.0–36.0)
MCV: 78.7 fL — ABNORMAL LOW (ref 80.0–100.0)
Monocytes Absolute: 0.4 10*3/uL (ref 0.1–1.0)
Monocytes Relative: 11 %
Neutro Abs: 0.9 10*3/uL — ABNORMAL LOW (ref 1.7–7.7)
Neutrophils Relative %: 24 %
Platelets: 239 10*3/uL (ref 150–400)
RBC: 4.78 MIL/uL (ref 3.87–5.11)
RDW: 15 % (ref 11.5–15.5)
Smear Review: NORMAL
WBC: 3.9 10*3/uL — ABNORMAL LOW (ref 4.0–10.5)
nRBC: 0 % (ref 0.0–0.2)

## 2019-05-25 LAB — URINALYSIS, COMPLETE (UACMP) WITH MICROSCOPIC
Bacteria, UA: NONE SEEN
Bilirubin Urine: NEGATIVE
Glucose, UA: NEGATIVE mg/dL
Hgb urine dipstick: NEGATIVE
Ketones, ur: NEGATIVE mg/dL
Leukocytes,Ua: NEGATIVE
Nitrite: NEGATIVE
Protein, ur: NEGATIVE mg/dL
Specific Gravity, Urine: 1.008 (ref 1.005–1.030)
pH: 6 (ref 5.0–8.0)

## 2019-05-25 LAB — COMPREHENSIVE METABOLIC PANEL
ALT: 34 U/L (ref 0–44)
AST: 31 U/L (ref 15–41)
Albumin: 3.5 g/dL (ref 3.5–5.0)
Alkaline Phosphatase: 69 U/L (ref 38–126)
Anion gap: 9 (ref 5–15)
BUN: 7 mg/dL (ref 6–20)
CO2: 24 mmol/L (ref 22–32)
Calcium: 8.8 mg/dL — ABNORMAL LOW (ref 8.9–10.3)
Chloride: 104 mmol/L (ref 98–111)
Creatinine, Ser: 0.57 mg/dL (ref 0.44–1.00)
GFR calc Af Amer: >60 mL/min (ref >60)
GFR calc non Af Amer: >60 mL/min (ref >60)
Glucose, Bld: 101 mg/dL — ABNORMAL HIGH (ref 70–99)
Potassium: 3.7 mmol/L (ref 3.5–5.1)
Sodium: 137 mmol/L (ref 135–145)
Total Bilirubin: 0.7 mg/dL (ref 0.3–1.2)
Total Protein: 8.4 g/dL — ABNORMAL HIGH (ref 6.5–8.1)

## 2019-05-25 LAB — PATHOLOGIST SMEAR REVIEW

## 2019-05-25 MED ORDER — HYDROCHLOROTHIAZIDE 12.5 MG PO TABS: 12.5000 mg | ORAL_TABLET | Freq: Every day | ORAL | 3 refills | Status: DC

## 2019-05-25 MED ORDER — AMLODIPINE BESYLATE 5 MG PO TABS: 5.0000 mg | ORAL_TABLET | Freq: Every day | ORAL | 3 refills | Status: AC

## 2019-05-25 MED ORDER — GADOBUTROL 1 MMOL/ML IV SOLN
10.0000 mL | Freq: Once | INTRAVENOUS | Status: AC | PRN
  Administered 2019-05-25: 10 mL via INTRAVENOUS

## 2019-05-25 NOTE — ED Notes (Signed)
MD at bedside to discus finding and plan of care.

## 2019-05-25 NOTE — ED Notes (Signed)
Assumed care of patient reports having dizzy spells and numbness to right arm, reports this started last weak. Awaiting MRI. Patient has been screened by MRI tech. Urine obtained and sent.

## 2019-05-25 NOTE — ED Provider Notes (Signed)
[facility] Emergency Department Provider Note   ____________________________________________    I have reviewed the triage vital signs and the nursing notes.   HISTORY  Chief Complaint Numbness    HPI Penny Young is a 33 y.o. female who presents with complaints of numbness in her right arm, she describes it as a tingling sensation and that her arm "just feels weird ".  She reports this started about a week ago in her hand and has progressed up her arm, it had been intermittent but over the last day it has been constant, she denies weakness although reports it is difficult to hold her phone because the way her arm feels.  She denies chest pain.  No neck pain.  No headaches.  She reports that she has been told that she had a stroke in the past when she was in her 20s.  Additionally she reports "pain "that has been shooting down her right leg occasionally although none currently.  Past Medical History:  Diagnosis Date  . Asthma   . Bipolar 1 disorder ([place])   . Drug abuse ([place])   . Hepatitis B   . HIV (human immunodeficiency virus infection) ([place])   . Hypertension   . Obese   . Restless leg syndrome   . Schizophrenia [facility])     Patient Active Problem List   Diagnosis Date Noted  . HIV DISEASE 04/27/2008    Past Surgical History:  Procedure Laterality Date  . HERNIA REPAIR    . TUBAL LIGATION    . TYMPANOSTOMY TUBE PLACEMENT      Prior to Admission medications   Medication Sig Start Date End Date Taking? Authorizing Provider  ranitidine (ZANTAC) 150 MG tablet Take 150 mg by mouth 2 (two) times daily. 05/07/18  Yes [provider]  amLODipine (NORVASC) 5 MG tablet Take 1 tablet (5 mg total) by mouth daily. 05/25/19   Lavonia Drafts, MD  hydrochlorothiazide (HYDRODIURIL) 12.5 MG tablet Take 1 tablet (12.5 mg total) by mouth daily. 05/25/19   Lavonia Drafts, MD     Allergies Aspirin  Family History  Problem Relation Age of Onset  .  Hypertension Other     Social History Social History   Tobacco Use  . Smoking status: Current Every Day Smoker    Packs/day: 0.10    Types: Cigarettes    Last attempt to quit: 11/26/2007    Years since quitting: 11.5  . Smokeless tobacco: Never Used  Substance Use Topics  . Alcohol use: Yes    Comment: occ  . Drug use: Not Currently    Types: Marijuana    Comment: past abuse    Review of Systems  Constitutional: No fever/chills Eyes: No visual changes.  ENT: No neck pain Cardiovascular: Denies chest pain. Respiratory: Denies shortness of breath. Gastrointestinal: No abdominal pain.  No nausea, no vomiting.   Genitourinary: Negative for dysuria. Musculoskeletal: No back pain Skin: Negative for rash. Neurological: As above   ____________________________________________   PHYSICAL EXAM:  VITAL SIGNS: ED Triage Vitals  Enc Vitals Group     BP 05/25/19 0805 (!) 149/89     Pulse Rate 05/25/19 0805 94     Resp 05/25/19 0805 16     Temp 05/25/19 0805 98.5 F (36.9 C)     Temp Source 05/25/19 0805 Oral     SpO2 05/25/19 0805 100 %     Weight 05/25/19 0804 113.4 kg (250 lb)     Height 05/25/19 0804  1.702 m (5\' 7" )     Head Circumference --      Peak Flow --      Pain Score 05/25/19 0811 8     Pain Loc --      Pain Edu? --      Excl. in GC? --     Constitutional: Alert and oriented.   Nose: No congestion/rhinnorhea. Mouth/Throat: Mucous membranes are moist.    Cardiovascular: Normal rate, regular rhythm. Grossly normal heart sounds.  Good peripheral circulation. Respiratory: Normal respiratory effort.  No retractions. Lungs CTAB. Gastrointestinal: Soft and nontender. No distention.  No CVA tenderness. Genitourinary: deferred Musculoskeletal: No lower extremity tenderness nor edema.  Warm and well perfused, strength is equal in all extremities Neurologic:  Normal speech and language. No gross focal neurologic deficits are appreciated.  Cranial nerves II through  XII are intact Skin:  Skin is warm, dry and intact. No rash noted. Psychiatric: Mood and affect are normal. Speech and behavior are normal.  ____________________________________________   LABS (all labs ordered are listed, but only abnormal results are displayed)  Labs Reviewed  URINALYSIS, COMPLETE (UACMP) WITH MICROSCOPIC - Abnormal; Notable for the following components:      Result Value   Color, Urine YELLOW (*)    APPearance HAZY (*)    All other components within normal limits  COMPREHENSIVE METABOLIC PANEL - Abnormal; Notable for the following components:   Glucose, Bld 101 (*)    Calcium 8.8 (*)    Total Protein 8.4 (*)    All other components within normal limits  CBC WITH DIFFERENTIAL/PLATELET - Abnormal; Notable for the following components:   WBC 3.9 (*)    MCV 78.7 (*)    Neutro Abs 0.9 (*)    All other components within normal limits  PATHOLOGIST SMEAR REVIEW  POC URINE PREG, ED   ____________________________________________  EKG  ED ECG REPORT I, Jene Everyobert Eden Toohey, the attending physician, personally viewed and interpreted this ECG.  Date: 05/25/2019  Rhythm: normal sinus rhythm QRS Axis: normal Intervals: normal ST/T Wave abnormalities: normal Narrative Interpretation: no evidence of acute ischemia  ____________________________________________  RADIOLOGY  MRI brain ____________________________________________   PROCEDURES  Procedure(s) performed: No  Procedures   Critical Care performed: No ____________________________________________   INITIAL IMPRESSION / ASSESSMENT AND PLAN / ED COURSE  Pertinent labs & imaging results that were available during my care of the patient were reviewed by me and considered in my medical decision making (see chart for details).  Presents with numbness/tingling of the right arm primarily.  Strength appears normal, cranial nerves intact.  Neuro exam otherwise is reassuring.  Patient does smoke, does have a  history of high blood pressure, differential includes paresthesia related to cervical radiculopathy, MS, less likely stroke.  Will obtain MRI brain with and without contrast   MRI without acute abnormality, chronic changes will require follow-up with neurology.  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   Jene EveryKinner, Gerica Koble, MD 05/25/19 1346

## 2019-05-25 NOTE — ED Notes (Signed)
Patient given warm blanket and water. Call bell at bedside. No further needs expressed at this time.

## 2019-05-25 NOTE — ED Notes (Signed)
Patient of unit to MRI

## 2019-05-25 NOTE — ED Triage Notes (Signed)
Pt reports right sided numbness/weakness and dizziness with movement x2 weeks. Reports constipation x1 week.

## 2019-05-25 NOTE — ED Notes (Signed)
Patient reports "had a tubleligation no need for preg test".

## 2019-08-30 ENCOUNTER — Emergency Department: Payer: Medicaid Other

## 2019-08-30 ENCOUNTER — Inpatient Hospital Stay
Admission: EM | Admit: 2019-08-30 | Discharge: 2019-09-03 | DRG: 975 | Disposition: A | Discharge status: Home / Self Care | Payer: Medicaid Other | Attending: Specialist | Admitting: Specialist

## 2019-08-30 ENCOUNTER — Other Ambulatory Visit: Payer: Self-pay

## 2019-08-30 DIAGNOSIS — Z8619 Personal history of other infectious and parasitic diseases: Secondary | ICD-10-CM

## 2019-08-30 DIAGNOSIS — G2581 Restless legs syndrome: Secondary | ICD-10-CM | POA: Diagnosis present

## 2019-08-30 DIAGNOSIS — E669 Obesity, unspecified: Secondary | ICD-10-CM | POA: Diagnosis present

## 2019-08-30 DIAGNOSIS — Z6841 Body Mass Index (BMI) 40.0 and over, adult: Secondary | ICD-10-CM

## 2019-08-30 DIAGNOSIS — B589 Toxoplasmosis, unspecified: Secondary | ICD-10-CM | POA: Diagnosis present

## 2019-08-30 DIAGNOSIS — E876 Hypokalemia: Secondary | ICD-10-CM | POA: Diagnosis present

## 2019-08-30 DIAGNOSIS — F17210 Nicotine dependence, cigarettes, uncomplicated: Secondary | ICD-10-CM | POA: Diagnosis present

## 2019-08-30 DIAGNOSIS — Z79899 Other long term (current) drug therapy: Secondary | ICD-10-CM

## 2019-08-30 DIAGNOSIS — G936 Cerebral edema: Secondary | ICD-10-CM

## 2019-08-30 DIAGNOSIS — Z20828 Contact with and (suspected) exposure to other viral communicable diseases: Secondary | ICD-10-CM | POA: Diagnosis present

## 2019-08-30 DIAGNOSIS — B20 Human immunodeficiency virus [HIV] disease: Secondary | ICD-10-CM

## 2019-08-30 DIAGNOSIS — Z21 Asymptomatic human immunodeficiency virus [HIV] infection status: Secondary | ICD-10-CM

## 2019-08-30 DIAGNOSIS — G35 Multiple sclerosis: Secondary | ICD-10-CM | POA: Diagnosis present

## 2019-08-30 DIAGNOSIS — R200 Anesthesia of skin: Secondary | ICD-10-CM | POA: Diagnosis present

## 2019-08-30 DIAGNOSIS — F319 Bipolar disorder, unspecified: Secondary | ICD-10-CM | POA: Diagnosis present

## 2019-08-30 DIAGNOSIS — G939 Disorder of brain, unspecified: Secondary | ICD-10-CM

## 2019-08-30 DIAGNOSIS — F209 Schizophrenia, unspecified: Secondary | ICD-10-CM | POA: Diagnosis present

## 2019-08-30 DIAGNOSIS — Z8249 Family history of ischemic heart disease and other diseases of the circulatory system: Secondary | ICD-10-CM

## 2019-08-30 DIAGNOSIS — Z886 Allergy status to analgesic agent status: Secondary | ICD-10-CM

## 2019-08-30 DIAGNOSIS — I10 Essential (primary) hypertension: Secondary | ICD-10-CM | POA: Diagnosis present

## 2019-08-30 LAB — URINALYSIS, COMPLETE (UACMP) WITH MICROSCOPIC
Bilirubin Urine: NEGATIVE
Glucose, UA: NEGATIVE mg/dL
Ketones, ur: NEGATIVE mg/dL
Leukocytes,Ua: NEGATIVE
Nitrite: NEGATIVE
Protein, ur: NEGATIVE mg/dL
RBC / HPF: >50 RBC/hpf — ABNORMAL HIGH (ref 0–5)
Specific Gravity, Urine: 1.023 (ref 1.005–1.030)
pH: 6 (ref 5.0–8.0)

## 2019-08-30 LAB — CRYPTOCOCCAL ANTIGEN: Crypto Ag: NEGATIVE

## 2019-08-30 LAB — BASIC METABOLIC PANEL
Anion gap: 10 (ref 5–15)
BUN: 12 mg/dL (ref 6–20)
CO2: 25 mmol/L (ref 22–32)
Calcium: 8.9 mg/dL (ref 8.9–10.3)
Chloride: 99 mmol/L (ref 98–111)
Creatinine, Ser: 0.5 mg/dL (ref 0.44–1.00)
GFR calc Af Amer: >60 mL/min (ref >60)
GFR calc non Af Amer: >60 mL/min (ref >60)
Glucose, Bld: 97 mg/dL (ref 70–99)
Potassium: 3.3 mmol/L — ABNORMAL LOW (ref 3.5–5.1)
Sodium: 134 mmol/L — ABNORMAL LOW (ref 135–145)

## 2019-08-30 LAB — CBC WITH DIFFERENTIAL/PLATELET
Abs Immature Granulocytes: 0.02 10*3/uL (ref 0.00–0.07)
Basophils Absolute: 0 10*3/uL (ref 0.0–0.1)
Basophils Relative: 1 %
Eosinophils Absolute: 0.3 10*3/uL (ref 0.0–0.5)
Eosinophils Relative: 7 %
HCT: 38 % (ref 36.0–46.0)
Hemoglobin: 12.9 g/dL (ref 12.0–15.0)
Immature Granulocytes: 1 %
Lymphocytes Relative: 44 %
Lymphs Abs: 1.9 10*3/uL (ref 0.7–4.0)
MCH: 26.2 pg (ref 26.0–34.0)
MCHC: 33.9 g/dL (ref 30.0–36.0)
MCV: 77.2 fL — ABNORMAL LOW (ref 80.0–100.0)
Monocytes Absolute: 0.5 10*3/uL (ref 0.1–1.0)
Monocytes Relative: 11 %
Neutro Abs: 1.5 10*3/uL — ABNORMAL LOW (ref 1.7–7.7)
Neutrophils Relative %: 36 %
Platelets: 262 10*3/uL (ref 150–400)
RBC: 4.92 MIL/uL (ref 3.87–5.11)
RDW: 14.5 % (ref 11.5–15.5)
WBC: 4.2 10*3/uL (ref 4.0–10.5)
nRBC: 0 % (ref 0.0–0.2)

## 2019-08-30 LAB — SARS CORONAVIRUS 2 (TAT 6-24 HRS): SARS Coronavirus 2: NEGATIVE

## 2019-08-30 LAB — POCT PREGNANCY, URINE: Preg Test, Ur: NEGATIVE

## 2019-08-30 MED ORDER — ONDANSETRON HCL 4 MG PO TABS: 4.0000 mg | ORAL_TABLET | Freq: Four times a day (QID) | ORAL | Status: DC | PRN

## 2019-08-30 MED ORDER — ACETAMINOPHEN 325 MG PO TABS
650.0000 mg | ORAL_TABLET | Freq: Four times a day (QID) | ORAL | Status: DC | PRN
  Administered 2019-08-30: 16:00:00 650 mg via ORAL
  Filled 2019-08-30: qty 2

## 2019-08-30 MED ORDER — IOHEXOL 9 MG/ML PO SOLN: 500.0000 mL | ORAL | Status: DC

## 2019-08-30 MED ORDER — POLYETHYLENE GLYCOL 3350 17 G PO PACK: 17.0000 g | PACK | Freq: Every day | ORAL | Status: DC | PRN

## 2019-08-30 MED ORDER — GADOBUTROL 1 MMOL/ML IV SOLN
10.0000 mL | Freq: Once | INTRAVENOUS | Status: AC | PRN
  Administered 2019-08-30: 10 mL via INTRAVENOUS

## 2019-08-30 MED ORDER — ALBUTEROL SULFATE (2.5 MG/3ML) 0.083% IN NEBU: 2.5000 mg | INHALATION_SOLUTION | RESPIRATORY_TRACT | Status: DC | PRN

## 2019-08-30 MED ORDER — ENOXAPARIN SODIUM 40 MG/0.4ML ~~LOC~~ SOLN: 40.0000 mg | SUBCUTANEOUS | Status: DC

## 2019-08-30 MED ORDER — POTASSIUM CHLORIDE CRYS ER 20 MEQ PO TBCR
40.0000 meq | EXTENDED_RELEASE_TABLET | Freq: Once | ORAL | Status: AC
  Administered 2019-08-30: 40 meq via ORAL
  Filled 2019-08-30: qty 2

## 2019-08-30 MED ORDER — ENOXAPARIN SODIUM 40 MG/0.4ML ~~LOC~~ SOLN
40.0000 mg | Freq: Two times a day (BID) | SUBCUTANEOUS | Status: DC
  Administered 2019-08-30 – 2019-08-31 (×2): 40 mg via SUBCUTANEOUS
  Filled 2019-08-30 (×4): qty 0.4

## 2019-08-30 MED ORDER — AMLODIPINE BESYLATE 5 MG PO TABS
5.0000 mg | ORAL_TABLET | Freq: Every day | ORAL | Status: DC
  Administered 2019-08-30 – 2019-09-03 (×5): 5 mg via ORAL
  Filled 2019-08-30 (×5): qty 1

## 2019-08-30 MED ORDER — IOHEXOL 300 MG/ML  SOLN
100.0000 mL | Freq: Once | INTRAMUSCULAR | Status: AC | PRN
  Administered 2019-08-30: 100 mL via INTRAVENOUS

## 2019-08-30 MED ORDER — ACETAMINOPHEN 650 MG RE SUPP: 650.0000 mg | Freq: Four times a day (QID) | RECTAL | Status: DC | PRN

## 2019-08-30 MED ORDER — ONDANSETRON HCL 4 MG/2ML IJ SOLN: 4.0000 mg | Freq: Four times a day (QID) | INTRAMUSCULAR | Status: DC | PRN

## 2019-08-30 MED ORDER — HYDROCHLOROTHIAZIDE 25 MG PO TABS
12.5000 mg | ORAL_TABLET | Freq: Every day | ORAL | Status: DC
  Administered 2019-08-30 – 2019-08-31 (×2): 12.5 mg via ORAL
  Filled 2019-08-30 (×2): qty 1

## 2019-08-30 NOTE — H&P (Signed)
[employer] - [place] at [facility]   PATIENT NAME: Penny Young    MR#:  [id number]  DATE OF BIRTH:  [birth year]  DATE OF ADMISSION:  08/30/2019  PRIMARY CARE PHYSICIAN: System, Pcp Not In   REQUESTING/REFERRING PHYSICIAN: Dr. Larinda Buttery  CHIEF COMPLAINT:   Chief Complaint  Patient presents with  . Numbness    HISTORY OF PRESENT ILLNESS:  Penny Young  is a 33 y.o. female with a known history of HIV, hepatitis B, bipolar disorder, schizophrenia, asthma, hypertension presents to the emergency room complaining of 1 week of numbness in the lower right side of the face.  No trouble with vision or swallowing.  No other focal weakness or numbness.  Patient had MRI of the brain with and without contrast which showed white matter changes in the periphery concerning for multiple sclerosis or complications of HIV.  Neurology has seen the patient and recommended admission and further work-up along with infectious disease consultation.  Patient was diagnosed with HIV 18 years back but due to low viral nodes was not treated according to the patient.  No further follow-up.  No complications  PAST MEDICAL HISTORY:   Past Medical History:  Diagnosis Date  . Asthma   . Bipolar 1 disorder (HCC)   . Drug abuse (HCC)   . Hepatitis B   . HIV (human immunodeficiency virus infection) (HCC)   . Hypertension   . Obese   . Restless leg syndrome   . Schizophrenia (HCC)     PAST SURGICAL HISTORY:   Past Surgical History:  Procedure Laterality Date  . HERNIA REPAIR    . TUBAL LIGATION    . TYMPANOSTOMY TUBE PLACEMENT      SOCIAL HISTORY:   Social History   Tobacco Use  . Smoking status: Current Every Day Smoker    Packs/day: 0.10    Types: Cigarettes    Last attempt to quit: 11/26/2007    Years since quitting: 11.7  . Smokeless tobacco: Never Used  Substance Use Topics  . Alcohol use: Yes    Comment: occ    FAMILY HISTORY:   Family History  Problem Relation Age of  Onset  . Hypertension Other     DRUG ALLERGIES:   Allergies  Allergen Reactions  . Aspirin Itching    REVIEW OF SYSTEMS:   Review of Systems  Constitutional: Positive for malaise/fatigue. Negative for chills, fever and weight loss.  HENT: Negative for hearing loss and nosebleeds.   Eyes: Negative for blurred vision, double vision and pain.  Respiratory: Negative for cough, hemoptysis, sputum production, shortness of breath and wheezing.   Cardiovascular: Negative for chest pain, palpitations, orthopnea and leg swelling.  Gastrointestinal: Negative for abdominal pain, constipation, diarrhea, nausea and vomiting.  Genitourinary: Negative for dysuria and hematuria.  Musculoskeletal: Negative for back pain, falls and myalgias.  Skin: Negative for rash.  Neurological: Positive for sensory change. Negative for dizziness, tremors, speech change, focal weakness, seizures and headaches.  Endo/Heme/Allergies: Does not bruise/bleed easily.  Psychiatric/Behavioral: Negative for depression and memory loss. The patient is not nervous/anxious.     MEDICATIONS AT HOME:   Prior to Admission medications   Medication Sig Start Date End Date Taking? Authorizing Provider  amLODipine (NORVASC) 5 MG tablet Take 1 tablet (5 mg total) by mouth daily. 05/25/19  Yes Jene Every, MD  hydrochlorothiazide (HYDRODIURIL) 12.5 MG tablet Take 1 tablet (12.5 mg total) by mouth daily. 05/25/19  Yes Jene Every, MD     VITAL SIGNS:  Blood pressure 117/82, pulse 79, temperature 98.6 F (37 C), temperature source Oral, resp. rate 18, height  (1.702 m), weight 99.8 kg, last menstrual period 08/30/2019, SpO2 98 %.  PHYSICAL EXAMINATION:  Physical Exam  GENERAL:  33 y.o.-year-old patient lying in the bed with no acute distress.  EYES: Pupils equal, round, reactive to light and accommodation. No scleral icterus. Extraocular muscles intact.  HEENT: Head atraumatic, normocephalic. Oropharynx and nasopharynx  clear. No oropharyngeal erythema, moist oral mucosa  NECK:  Supple, no jugular venous distention. No thyroid enlargement, no tenderness.  LUNGS: Normal breath sounds bilaterally, no wheezing, rales, rhonchi. No use of accessory muscles of respiration.  CARDIOVASCULAR: S1, S2 normal. No murmurs, rubs, or gallops.  ABDOMEN: Soft, nontender, nondistended. Bowel sounds present. No organomegaly or mass.  EXTREMITIES: No pedal edema, cyanosis, or clubbing. + 2 pedal & radial pulses b/l.   NEUROLOGIC: Cranial nerves II through XII are intact. No focal Motor or sensory deficits appreciated b/l. Decreased sensation in the right lower face. PSYCHIATRIC: The patient is alert and oriented x 3. Good affect.  SKIN: No obvious rash, lesion, or ulcer.   LABORATORY PANEL:   CBC Recent Labs  Lab 08/30/19 0834  WBC 4.2  HGB 12.9  HCT 38.0  PLT 262   ------------------------------------------------------------------------------------------------------------------  Chemistries  Recent Labs  Lab 08/30/19 0834  NA 134*  K 3.3*  CL 99  CO2 25  GLUCOSE 97  BUN 12  CREATININE 0.50  CALCIUM 8.9   ------------------------------------------------------------------------------------------------------------------  Cardiac Enzymes No results for input(s): TROPONINI in the last 168 hours. ------------------------------------------------------------------------------------------------------------------  RADIOLOGY:  Ct Chest W Contrast  Result Date: 08/30/2019 CLINICAL DATA:  Brain metastasis? Follow-up for lesions of brain seen on MRI this morning. Numbness in face and head. EXAM: CT CHEST, ABDOMEN, AND PELVIS WITH CONTRAST TECHNIQUE: Multidetector CT imaging of the chest, abdomen and pelvis was performed following the standard protocol during bolus administration of intravenous contrast. CONTRAST:  OMNIPAQUE IOHEXOL 300 MG/ML  SOLN COMPARISON:  None. FINDINGS: CT CHEST FINDINGS Cardiovascular:  Heart size is normal. No pericardial effusion. No thoracic aortic aneurysm. Mediastinum/Nodes: Single borderline pathologic by size criteria lymph node in the anterior mediastinum, with short axis measurement of 11 mm. No definitively enlarged or morphologically abnormal lymph nodes identified within the mediastinum or perihilar regions. Esophagus appears normal. Trachea and central bronchi are unremarkable. Lungs/Pleura: Lungs are clear. No pleural effusions. Musculoskeletal: No acute or suspicious osseous finding within the chest. Numerous small and mildly prominent lymph nodes in the bilateral axillary regions, including several mildly prominent retropectoral lymph nodes bilaterally, suspicious by number CT ABDOMEN PELVIS FINDINGS Hepatobiliary: No focal liver abnormality is seen. No gallstones, gallbladder wall thickening, or biliary dilatation. Pancreas: Unremarkable. No pancreatic ductal dilatation or surrounding inflammatory changes. Spleen: Normal in size without focal abnormality. Adrenals/Urinary Tract: Adrenal glands appear normal. Kidneys are unremarkable without mass, stone or hydronephrosis. Bladder is unremarkable. Stomach/Bowel: Stomach is within normal limits. Appendix appears normal. No evidence of bowel wall thickening, distention, or inflammatory changes. Appendix is normal. Stomach is unremarkable, partially decompressed. Vascular/Lymphatic: No vascular abnormality identified. Scattered small and mildly prominent lymph nodes within the retroperitoneum and iliac chain regions bilaterally, and within the inguinal regions bilaterally, none of which are definitively pathologic by CT size criteria. Reproductive: Bilateral tubal ligation clips. Other: No free fluid or abscess collection. No soft tissue mass identified within the abdomen or pelvis. Nodular thickening deep to the umbilicus, presumably postsurgical change related to the tubal ligation. Musculoskeletal: No  acute or suspicious osseous  finding. IMPRESSION: 1. No acute findings within the abdomen or pelvis. No evidence of primary malignancy or metastatic disease within the chest, abdomen or pelvis. 2. Single borderline pathologic by size criteria lymph node in the anterior mediastinum, with short axis measurement of 11 mm. No definitively enlarged or morphologically abnormal lymph nodes identified within the mediastinum or perihilar regions. 3. Numerous small and mildly prominent lymph nodes within the bilateral axillary regions, including several mildly prominent retropectoral lymph nodes bilaterally, none of which are definitively pathologic by CT size criteria but these are suspicious by number. 4. Scattered small and mildly prominent lymph nodes within the retroperitoneum and iliac chain regions bilaterally, and within the inguinal regions bilaterally, again none of which are definitively pathologic by CT size criteria. 5. Nodular thickening deep to the umbilicus, presumably postsurgical change related to the tubal ligation. Recommend correlation with surgical history. Electronically Signed   By: Bary RichardStan  Maynard M.D.   On: 08/30/2019 13:36   Mr Laqueta JeanBrain W And Wo Contrast  Addendum Date: 08/30/2019   ADDENDUM REPORT: 08/30/2019 11:55 ADDENDUM: In addition to atypical infection (i.e. Toxoplasmosis), tumefactive demyelination and metastatic disease, CNS lymphoma is also a differential consideration. Correlate with clinically and with CD4 count. Addendum called by telephone at the time of interpretation on 08/30/2019 at 11:54 am to provider [facility] , who verbally acknowledged these results. Electronically Signed   By: Jackey LogeKyle  Golden   On: 08/30/2019 11:55   Result Date: 08/30/2019 CLINICAL DATA:  Focal neuro deficit, greater than 6 hours, stroke suspected. Additional history provided: Patient reports right-sided throat, lip and tongue numbness since 08/20/2019, loss of taste. EXAM: MRI HEAD WITHOUT AND WITH CONTRAST TECHNIQUE: Multiplanar,  multiecho pulse sequences of the brain and surrounding structures were obtained without and with intravenous contrast. CONTRAST:  10mL GADAVIST GADOBUTROL 1 MMOL/ML IV SOLN COMPARISON:  Brain MRI 05/25/2019 FINDINGS: Brain: There are multiple intracranial enhancing lesions which are new as compared to prior MRI 05/25/2019. Within the subcortical white matter of the anterior left frontal lobe, there is a 2.0 x 1.6 x 1.7 cm (AP x TV x CC) peripherally enhancing lesion with prominent surrounding edema. Resultant mass effect with rightward bowing of the anterior falx and early rightward subfalcine herniation. More posteriorly within the lateral left frontal lobe, there is a 0.4 cm peripherally enhancing juxtacortical lesion with a small amount of surrounding edema. 3 mm peripherally enhancing juxtacortical lesion within the left frontal operculum with a small amount of surrounding edema. (Series 20, image 16). 1.5 x 0.7 cm focus of subcortical/juxtacrotical T2/FLAIR hyperintensity within the left parietooccipital lobe with adjacent curvilinear enhancement (series 15, image 30). 1.6 x 1.9 cm focus of T2/FLAIR hyperintensity within the left thalamus also involving portions of the posterior limb of left internal capsule and extending inferiorly toward the midbrain. Centrally within this lesion there is a peripherally enhancing focus measuring 0.6 cm (series 15, image 27) (series 19, image 86). In addition to the white matter disease demonstrated on prior brain MRI, there are several additional new small foci of predominantly subcortical/juxtacortical T2/FLAIR hyperintensity, some of which demonstrate associated curvilinear enhancement (for instance a lesion within the anterior right frontal lobe (series 15, image 35) (series 19, images 104-107). No definite intracranial blood products. Curvilinear SWI signal loss in the region of several of these lesions appears to reflect dilated venous vasculature. No evidence of acute  infarct. No midline shift at the level of the septum pellucidum. Incidentally noted cavum septum pellucidum and  cavum vergae. Vascular: Flow voids maintained within the proximal large arterial vessels. Skull and upper cervical spine: No focal marrow lesion. Sinuses/Orbits: Visualized orbits demonstrate no acute abnormality. Mucous retention cyst within a posterior left ethmoid air cell. Frothy secretions within the bilateral sphenoid sinuses. Small bilateral mastoid effusions These results were called by telephone at the time of interpretation on 08/30/2019 at 11:30 am to provider [facility] JESSUP , who verbally acknowledged these results. IMPRESSION: 1. Multiple peripherally enhancing parenchymal lesions which are predominantly juxtacortical/subcortical in location with surrounding edema, new as compared to MRI 05/25/2019. A dominant 2 cm lesion within the anterior left frontal lobe has prominent surrounding edema with associated mass effect and resultant early rightward subfalcine herniation. 2. New 0.6 cm peripherally enhancing lesion with surrounding edema within the left thalamus also involving portions of the posterior limb of left internal capsule and extending inferiorly toward the midbrain. 3. Multiple additional new juxtacortical/subcortical T2 hyperintense lesions, some of which demonstrate associated curvilinear enhancement. 4. The constellation of findings is nonspecific and favored differential considerations include tumefactive demyelination, atypical infection or metastatic disease. Electronically Signed: By: Jackey LogeKyle  Golden On: 08/30/2019 11:31   Ct Abdomen Pelvis W Contrast  Result Date: 08/30/2019 CLINICAL DATA:  Brain metastasis? Follow-up for lesions of brain seen on MRI this morning. Numbness in face and head. EXAM: CT CHEST, ABDOMEN, AND PELVIS WITH CONTRAST TECHNIQUE: Multidetector CT imaging of the chest, abdomen and pelvis was performed following the standard protocol during bolus  administration of intravenous contrast. CONTRAST:  100mL OMNIPAQUE IOHEXOL 300 MG/ML  SOLN COMPARISON:  None. FINDINGS: CT CHEST FINDINGS Cardiovascular: Heart size is normal. No pericardial effusion. No thoracic aortic aneurysm. Mediastinum/Nodes: Single borderline pathologic by size criteria lymph node in the anterior mediastinum, with short axis measurement of 11 mm. No definitively enlarged or morphologically abnormal lymph nodes identified within the mediastinum or perihilar regions. Esophagus appears normal. Trachea and central bronchi are unremarkable. Lungs/Pleura: Lungs are clear. No pleural effusions. Musculoskeletal: No acute or suspicious osseous finding within the chest. Numerous small and mildly prominent lymph nodes in the bilateral axillary regions, including several mildly prominent retropectoral lymph nodes bilaterally, suspicious by number CT ABDOMEN PELVIS FINDINGS Hepatobiliary: No focal liver abnormality is seen. No gallstones, gallbladder wall thickening, or biliary dilatation. Pancreas: Unremarkable. No pancreatic ductal dilatation or surrounding inflammatory changes. Spleen: Normal in size without focal abnormality. Adrenals/Urinary Tract: Adrenal glands appear normal. Kidneys are unremarkable without mass, stone or hydronephrosis. Bladder is unremarkable. Stomach/Bowel: Stomach is within normal limits. Appendix appears normal. No evidence of bowel wall thickening, distention, or inflammatory changes. Appendix is normal. Stomach is unremarkable, partially decompressed. Vascular/Lymphatic: No vascular abnormality identified. Scattered small and mildly prominent lymph nodes within the retroperitoneum and iliac chain regions bilaterally, and within the inguinal regions bilaterally, none of which are definitively pathologic by CT size criteria. Reproductive: Bilateral tubal ligation clips. Other: No free fluid or abscess collection. No soft tissue mass identified within the abdomen or pelvis.  Nodular thickening deep to the umbilicus, presumably postsurgical change related to the tubal ligation. Musculoskeletal: No acute or suspicious osseous finding. IMPRESSION: 1. No acute findings within the abdomen or pelvis. No evidence of primary malignancy or metastatic disease within the chest, abdomen or pelvis. 2. Single borderline pathologic by size criteria lymph node in the anterior mediastinum, with short axis measurement of 11 mm. No definitively enlarged or morphologically abnormal lymph nodes identified within the mediastinum or perihilar regions. 3. Numerous small and mildly prominent lymph nodes within the bilateral  axillary regions, including several mildly prominent retropectoral lymph nodes bilaterally, none of which are definitively pathologic by CT size criteria but these are suspicious by number. 4. Scattered small and mildly prominent lymph nodes within the retroperitoneum and iliac chain regions bilaterally, and within the inguinal regions bilaterally, again none of which are definitively pathologic by CT size criteria. 5. Nodular thickening deep to the umbilicus, presumably postsurgical change related to the tubal ligation. Recommend correlation with surgical history. Electronically Signed   By: Franki Cabot M.D.   On: 08/30/2019 13:36     IMPRESSION AND PLAN:   *Right facial numbness with MRI changes raising concern for multiple sclerosis or complication from HIV.  MRI cervical spine recommended by neurology. We will also consult infectious disease for further input. No steroids at this time till diagnosis established  * CT scan of the chest and abdomen/pelvis showed diffuse lymphadenopathy.  Likely due to HIV  *HIV.  Never treated.  Will get viral load and CD4 count.  Wait for further input from infectious disease.  *DVT prophylaxis with Lovenox  All the records are reviewed and case discussed with ED provider. Management plans discussed with the patient, family and they  are in agreement.  CODE STATUS: FULL CODE  TOTAL TIME TAKING CARE OF THIS PATIENT: 40 minutes.   Leia Alf Brindley Madarang M.D on 08/30/2019 at 1:56 PM  Between 7am to 6pm - Pager - [phone]  After 6pm go to [email or web address] - password [email or web address] [facility]  Office  [phone]  CC: Primary care physician; System, Pcp Not In  Note: This dictation was prepared with Dragon dictation along with smaller phrase technology. Any transcriptional errors that result from this process are unintentional.

## 2019-08-30 NOTE — Progress Notes (Signed)
   08/30/19 1500  Clinical Encounter Type  Visited With Patient  Visit Type Initial  Referral From Nurse  Spiritual Encounters  Spiritual Needs Emotional  Stress Factors  Patient Stress Factors Health changes  Ch was called for emotional support. Pt just received a news that she might have cancer. Pt has two children each 10 and 18 who are the source of her strength. Pt lost her sister at age 34 by natural death and has her mother who will have ovarian cancer surgery this coming 8th. These two incidences makes her worry about her future and makes her fearful. Pt cried as she recounted the story of her sister and shared her concerns yet she stayed hopeful and mentioned her trust in the [place] that He will take care of her. Ch agreed with her and gave her encouragement. Ch will follow up with her tomorrow morning.

## 2019-08-30 NOTE — ED Triage Notes (Signed)
Pt c/o right side of throat, lip and tongue numbness since 08/20/2019 while she was working,. States she has loss of taste with it. Denies any other sx or numbness

## 2019-08-30 NOTE — ED Provider Notes (Signed)
[facility] Emergency Department Provider Note   ____________________________________________   First MD Initiated Contact with Patient 08/30/19 [phone]     (approximate)  I have reviewed the triage vital signs and the nursing notes.   HISTORY  Chief Complaint Numbness    HPI Penny Young is a 33 y.o. female with past medical history of schizophrenia, HIV, hypertension who presents to the ED complaining of numbness.  Patient reports she has about approximately 1 week of right-sided facial numbness, right tongue numbness, and decreased ability to taste.  She denies any associated weakness, facial droop, or difficulty swallowing.  She denies any symptoms in her extremities, including numbness or weakness.  She states she has not had similar symptoms before, did have an episode of right upper extremity weakness that she was evaluated for in the ED.  She states she has not followed up with neurology or establish care with a PCP since then.  She states she is otherwise been feeling well with no fevers, chills, chest pain, shortness of breath.        Past Medical History:  Diagnosis Date  . Asthma   . Bipolar 1 disorder (HCC)   . Drug abuse (HCC)   . Hepatitis B   . HIV (human immunodeficiency virus infection) (HCC)   . Hypertension   . Obese   . Restless leg syndrome   . Schizophrenia [facility])     Patient Active Problem List   Diagnosis Date Noted  . Multiple sclerosis (HCC) 08/30/2019  . HIV DISEASE 04/27/2008    Past Surgical History:  Procedure Laterality Date  . HERNIA REPAIR    . TUBAL LIGATION    . TYMPANOSTOMY TUBE PLACEMENT      Prior to Admission medications   Medication Sig Start Date End Date Taking? Authorizing Provider  amLODipine (NORVASC) 5 MG tablet Take 1 tablet (5 mg total) by mouth daily. 05/25/19  Yes Jene Every, MD  hydrochlorothiazide (HYDRODIURIL) 12.5 MG tablet Take 1 tablet (12.5 mg total) by mouth daily. 05/25/19  Yes  Jene Every, MD    Allergies Aspirin  Family History  Problem Relation Age of Onset  . Hypertension Other     Social History Social History   Tobacco Use  . Smoking status: Current Every Day Smoker    Packs/day: 0.10    Types: Cigarettes    Last attempt to quit: 11/26/2007    Years since quitting: 11.7  . Smokeless tobacco: Never Used  Substance Use Topics  . Alcohol use: Yes    Comment: occ  . Drug use: Not Currently    Types: Marijuana    Comment: past abuse    Review of Systems  Constitutional: No fever/chills Eyes: No visual changes. ENT: No sore throat. Cardiovascular: Denies chest pain. Respiratory: Denies shortness of breath. Gastrointestinal: No abdominal pain.  No nausea, no vomiting.  No diarrhea.  No constipation. Genitourinary: Negative for dysuria. Musculoskeletal: Negative for back pain. Skin: Negative for rash. Neurological: Negative for headaches and focal weakness, positive for facial numbness and decreased taste.  ____________________________________________   PHYSICAL EXAM:  VITAL SIGNS: ED Triage Vitals  Enc Vitals Group     BP 08/30/19 0742 138/90     Pulse Rate 08/30/19 0742 (!) 104     Resp 08/30/19 0742 17     Temp 08/30/19 0742 98.6 F (37 C)     Temp Source 08/30/19 0742 Oral     SpO2 08/30/19 0742 99 %  Weight 08/30/19 0743 220 lb (99.8 kg)     Height 08/30/19 0743 5\' 7"  (1.702 m)     Head Circumference --      Peak Flow --      Pain Score 08/30/19 0743 0     Pain Loc --      Pain Edu? --      Excl. in GC? --     Constitutional: Alert and oriented. Eyes: Conjunctivae are normal.  Pupils equal round and reactive to light bilaterally, extraocular movements intact. Head: Atraumatic. Nose: No congestion/rhinnorhea. Mouth/Throat: Mucous membranes are moist. Neck: Normal ROM Cardiovascular: Normal rate, regular rhythm. Grossly normal heart sounds. Respiratory: Normal respiratory effort.  No retractions. Lungs CTAB.  Gastrointestinal: Soft and nontender. No distention. Genitourinary: deferred Musculoskeletal: No lower extremity tenderness nor edema. Neurologic:  Normal speech and language.  Subjective decrease sensation over right lower face and right tongue, sensation intact throughout bilateral extremities.  No facial droop noted and remainder of cranial nerves grossly intact.  Strength 5 out of 5 in bilateral upper and lower extremities. Skin:  Skin is warm, dry and intact. No rash noted. Psychiatric: Mood and affect are normal. Speech and behavior are normal.  ____________________________________________   LABS (all labs ordered are listed, but only abnormal results are displayed)  Labs Reviewed  URINALYSIS, COMPLETE (UACMP) WITH MICROSCOPIC - Abnormal; Notable for the following components:      Result Value   Color, Urine YELLOW (*)    APPearance HAZY (*)    Hgb urine dipstick LARGE (*)    RBC / HPF >50 (*)    Bacteria, UA RARE (*)    All other components within normal limits  CBC WITH DIFFERENTIAL/PLATELET - Abnormal; Notable for the following components:   MCV 77.2 (*)    Neutro Abs 1.5 (*)    All other components within normal limits  BASIC METABOLIC PANEL - Abnormal; Notable for the following components:   Sodium 134 (*)    Potassium 3.3 (*)    All other components within normal limits  SARS CORONAVIRUS 2 (TAT 6-24 HRS)  HELPER T-LYMPH-CD4 ([facility] ONLY)  HIV-1 RNA QUANT-NO REFLEX-BLD  POC URINE PREG, ED   ____________________________________________  EKG  ED ECG REPORT I, Chesley Noon, the attending physician, personally viewed and interpreted this ECG.   Date: 08/30/2019  EKG Time: 8:27  Rate: 92  Rhythm: normal sinus rhythm  Axis: Normal  Intervals:none  ST&T Change: None    PROCEDURES  Procedure(s) performed (including Critical Care):  Procedures   ____________________________________________   INITIAL IMPRESSION / ASSESSMENT AND PLAN / ED COURSE        33 year old female with history of unmanaged HIV and hypertension presents to the ED complaining of approximately 1 week of right facial and tongue numbness along with decreased taste.  She has subjectively decreased sensation over her right lower face but no other focal neurologic deficits.  She does have risk factors for stroke given her hypertension and smoking, would also be concerned for MS given her prior MRI with scattered white matter hypodensities.  Will repeat MRI with contrast today, check labs and UA.  Labs are unremarkable, however MRI shows multiple white matter densities, including dominant lesion in frontal lobe causing early subfalcine herniation.  Neurology was consulted, who recommends holding off on steroids for now, as this is not clearly represent MS and would make diagnosis of lymphoma more difficult.  Infectious etiology is considered less likely, however patient would benefit from ID consult.  Case discussed with hospitalist, who accepts patient for admission.      ____________________________________________   FINAL CLINICAL IMPRESSION(S) / ED DIAGNOSES  Final diagnoses:  Right facial numbness  Brain lesion  HIV DISEASE     ED Discharge Orders    None       Note:  This document was prepared using Dragon voice recognition software and may include unintentional dictation errors.   Blake Divine, MD 08/30/19 [phone]

## 2019-08-30 NOTE — Consult Note (Signed)
Reason for Consult: MS Referring Physician: Hospitalist/ER  CC: * HPI: Penny Young is an 33 y.o. female  Being admitted with right face numbness/tingling  She is a 33 y/o with h/o of HTN who presents to ER with right face numbness,. Patient states that she started having numbness in her lower, that spread to he right face. She also complains of tingling and lost of taste. She denies HA, visual disturbances/eye pain, denies nausea/vting, denies fever/cough, denies night sweat/weight loss. She denies any weakness. She admits for smoking a pack/week for "long time". She also states that 18 years ago she was tested positive for HIV and was not started on treatment b/c low viral load. She has a twin sister that has MS. There is no worsening of symptomatology. MRI obtained in ER: Multiple peripherally enhancing parenchymal lesions which arepredominantly juxtacortical/subcortical in location with surrounding edema, new as compared to MRI 05/25/2019. A dominant 2 cm lesion within the anterior left frontal lobe has prominent surrounding edema with associated mass effect and resultant early rightward subfalcine herniation. New 0.6 cm peripherally enhancing lesion with surrounding edema within the left thalamus also involving portions of the posterior limb of left internal capsule and extending inferiorly toward the midbrain.Multiple additional new juxtacortical/subcortical T2 hyperintense lesions, some of which demonstrate associated curvilinear enhancement. The constellation of findings is nonspecific and favoreddifferential considerations include tumefactive demyelination, atypical infection or metastatic disease.  Previous MRI on 6/30: Scattered small subcortical white matter hyperintensities Bilaterally for what seems to be a right arm numbness with difficulty holding objects ( notes on 6/30).   Past Medical History:  Diagnosis Date  . Asthma   . Bipolar 1 disorder (HCC)   . Drug abuse (HCC)   .  Hepatitis B   . HIV (human immunodeficiency virus infection) (HCC)   . Hypertension   . Obese   . Restless leg syndrome   . Schizophrenia [facility])     Past Surgical History:  Procedure Laterality Date  . HERNIA REPAIR    . TUBAL LIGATION    . TYMPANOSTOMY TUBE PLACEMENT      Family History  Problem Relation Age of Onset  . Hypertension Other     Social History:  reports that she has been smoking cigarettes. She has been smoking about 0.10 packs per day. She has never used smokeless tobacco. She reports current alcohol use. She reports previous drug use. Drug: Marijuana.  Allergies  Allergen Reactions  . Aspirin Itching    Medications: I have reviewed the patient's current medications.  ROS: As per HPI Physical Examination: Blood pressure 115/79, pulse 85, temperature 98.6 F (37 C), temperature source Oral, resp. rate 15, height 5\' 7"  (1.702 m), weight 99.8 kg, last menstrual period 08/30/2019, SpO2 99 %.  Neurologic Examination Alert, awake, oriented x4, speech is nle, follows commands PERLA, EOMI, no nystagmus, VFF, right naso labial effacement, decrease sensation to pinprick and touch on the right face, uvula/tongue midline No motor deficit appreciated No sensory deficit appreciated No coordination deficit appreciated DTR and gait not cheked at time of exam   Results for orders placed or performed during the hospital encounter of 08/30/19 (from the past 48 hour(s))  Urinalysis, Complete w Microscopic     Status: Abnormal   Collection Time: 08/30/19  8:34 AM  Result Value Ref Range   Color, Urine YELLOW (A) YELLOW   APPearance HAZY (A) CLEAR   Specific Gravity, Urine 1.023 1.005 - 1.030   pH 6.0 5.0 - 8.0   Glucose,  UA NEGATIVE NEGATIVE mg/dL   Hgb urine dipstick LARGE (A) NEGATIVE   Bilirubin Urine NEGATIVE NEGATIVE   Ketones, ur NEGATIVE NEGATIVE mg/dL   Protein, ur NEGATIVE NEGATIVE mg/dL   Nitrite NEGATIVE NEGATIVE   Leukocytes,Ua NEGATIVE NEGATIVE   RBC  / HPF >50 (H) 0 - 5 RBC/hpf   WBC, UA 0-5 0 - 5 WBC/hpf   Bacteria, UA RARE (A) NONE SEEN   Squamous Epithelial / LPF 0-5 0 - 5   Mucus PRESENT     Comment: Performed at [facility], [address] [place], [place] [address]  CBC with Differential     Status: Abnormal   Collection Time: 08/30/19  8:34 AM  Result Value Ref Range   WBC 4.2 4.0 - 10.5 K/uL   RBC 4.92 3.87 - 5.11 MIL/uL   Hemoglobin 12.9 12.0 - 15.0 g/dL   HCT 38.0 36.0 - 46.0 %   MCV 77.2 (L) 80.0 - 100.0 fL   MCH 26.2 26.0 - 34.0 pg   MCHC 33.9 30.0 - 36.0 g/dL   RDW 14.5 11.5 - 15.5 %   Platelets 262 150 - 400 K/uL   nRBC 0.0 0.0 - 0.2 %   Neutrophils Relative % 36 %   Neutro Abs 1.5 (L) 1.7 - 7.7 K/uL   Lymphocytes Relative 44 %   Lymphs Abs 1.9 0.7 - 4.0 K/uL   Monocytes Relative 11 %   Monocytes Absolute 0.5 0.1 - 1.0 K/uL   Eosinophils Relative 7 %   Eosinophils Absolute 0.3 0.0 - 0.5 K/uL   Basophils Relative 1 %   Basophils Absolute 0.0 0.0 - 0.1 K/uL   Immature Granulocytes 1 %   Abs Immature Granulocytes 0.02 0.00 - 0.07 K/uL    Comment: Performed at [facility], [address] [place], [place] [address]  Basic metabolic panel     Status: Abnormal   Collection Time: 08/30/19  8:34 AM  Result Value Ref Range   Sodium 134 (L) 135 - 145 mmol/L   Potassium 3.3 (L) 3.5 - 5.1 mmol/L   Chloride 99 98 - 111 mmol/L   CO2 25 22 - 32 mmol/L   Glucose, Bld 97 70 - 99 mg/dL   BUN 12 6 - 20 mg/dL   Creatinine, Ser 0.50 0.44 - 1.00 mg/dL   Calcium 8.9 8.9 - 10.3 mg/dL   GFR calc non Af Amer >60 >60 mL/min   GFR calc Af Amer >60 >60 mL/min   Anion gap 10 5 - 15    Comment: Performed at [facility] [address] [place], [place] [address]    No results found for this or any previous visit (from the past 240 hour(s)).  Mr Jeri [employer] Wo Contrast  Addendum Date: 08/30/2019   ADDENDUM REPORT: 08/30/2019 11:55 ADDENDUM: In addition to atypical infection (i.e.  Toxoplasmosis), tumefactive demyelination and metastatic disease, CNS lymphoma is also a differential consideration. Correlate with clinically and with CD4 count. Addendum called by telephone at the time of interpretation on 08/30/2019 at 11:54 am to provider [facility] , who verbally acknowledged these results. Electronically Signed   By: Kellie Simmering   On: 08/30/2019 11:55   Result Date: 08/30/2019 CLINICAL DATA:  Focal neuro deficit, greater than 6 hours, stroke suspected. Additional history provided: Patient reports right-sided throat, lip and tongue numbness since 08/20/2019, loss of taste. EXAM: MRI HEAD WITHOUT AND WITH CONTRAST TECHNIQUE: Multiplanar, multiecho pulse sequences of the brain and surrounding structures were  obtained without and with intravenous contrast. CONTRAST:  10mL GADAVIST GADOBUTROL 1 MMOL/ML IV SOLN COMPARISON:  Brain MRI 05/25/2019 FINDINGS: Brain: There are multiple intracranial enhancing lesions which are new as compared to prior MRI 05/25/2019. Within the subcortical white matter of the anterior left frontal lobe, there is a 2.0 x 1.6 x 1.7 cm (AP x TV x CC) peripherally enhancing lesion with prominent surrounding edema. Resultant mass effect with rightward bowing of the anterior falx and early rightward subfalcine herniation. More posteriorly within the lateral left frontal lobe, there is a 0.4 cm peripherally enhancing juxtacortical lesion with a small amount of surrounding edema. 3 mm peripherally enhancing juxtacortical lesion within the left frontal operculum with a small amount of surrounding edema. (Series 20, image 16). 1.5 x 0.7 cm focus of subcortical/juxtacrotical T2/FLAIR hyperintensity within the left parietooccipital lobe with adjacent curvilinear enhancement (series 15, image 30). 1.6 x 1.9 cm focus of T2/FLAIR hyperintensity within the left thalamus also involving portions of the posterior limb of left internal capsule and extending inferiorly toward the  midbrain. Centrally within this lesion there is a peripherally enhancing focus measuring 0.6 cm (series 15, image 27) (series 19, image 86). In addition to the white matter disease demonstrated on prior brain MRI, there are several additional new small foci of predominantly subcortical/juxtacortical T2/FLAIR hyperintensity, some of which demonstrate associated curvilinear enhancement (for instance a lesion within the anterior right frontal lobe (series 15, image 35) (series 19, images 104-107). No definite intracranial blood products. Curvilinear SWI signal loss in the region of several of these lesions appears to reflect dilated venous vasculature. No evidence of acute infarct. No midline shift at the level of the septum pellucidum. Incidentally noted cavum septum pellucidum and cavum vergae. Vascular: Flow voids maintained within the proximal large arterial vessels. Skull and upper cervical spine: No focal marrow lesion. Sinuses/Orbits: Visualized orbits demonstrate no acute abnormality. Mucous retention cyst within a posterior left ethmoid air cell. Frothy secretions within the bilateral sphenoid sinuses. Small bilateral mastoid effusions These results were called by telephone at the time of interpretation on 08/30/2019 at 11:30 am to [facility] , who verbally acknowledged these results. IMPRESSION: 1. Multiple peripherally enhancing parenchymal lesions which are predominantly juxtacortical/subcortical in location with surrounding edema, new as compared to MRI 05/25/2019. A dominant 2 cm lesion within the anterior left frontal lobe has prominent surrounding edema with associated mass effect and resultant early rightward subfalcine herniation. 2. New 0.6 cm peripherally enhancing lesion with surrounding edema within the left thalamus also involving portions of the posterior limb of left internal capsule and extending inferiorly toward the midbrain. 3. Multiple additional new juxtacortical/subcortical  T2 hyperintense lesions, some of which demonstrate associated curvilinear enhancement. 4. The constellation of findings is nonspecific and favored differential considerations include tumefactive demyelination, atypical infection or metastatic disease. Electronically Signed: By: Jackey LogeKyle  Golden On: 08/30/2019 11:31     Assessment/Plan: She is a 33 y/o with h/o of HTN who presents to ER with right face numbness/tingling for a week duration. Neuro exam with right facial/decrease sensation to pinprick and touch on the right face. MRI concerning for atypical infection vs mets vs MD vs primary brain lesion ( lymphoma??)  RECS: - Neuro protective measures including normothermia, normoglycemia, correct electrolytes/metabolic abnliites, treat infection - Obtain MRI w/wo of cervical spine - Obtain CT chest abdomen and pelvis wwo contrast - HIV panel - Consider ID consult - Consider neurosurgery consult if biopsy indicated - for now hold on steroids until exploration  done or worsenig 08/30/2019, 12:42 PM

## 2019-08-30 NOTE — Progress Notes (Signed)
Anticoagulation monitoring(Lovenox):  32yo  F ordered Lovenox 40 mg Q24h  Filed Weights   08/30/19 0743 08/30/19 1500  Weight: 220 lb (99.8 kg) 268 lb 15.4 oz (122 kg)   BMI 42   Lab Results  Component Value Date   CREATININE 0.50 08/30/2019   CREATININE 0.57 05/25/2019   CREATININE 0.63 03/27/2017   Estimated Creatinine Clearance: 135.5 mL/min (by C-G formula based on SCr of 0.5 mg/dL). Hemoglobin & Hematocrit     Component Value Date/Time   HGB 12.9 08/30/2019 0834   HCT 38.0 08/30/2019 0834     Per Protocol for Patient with estCrcl > 30 ml/min and BMI > 40, will transition to Lovenox 40 mg Q12h.      Chinita Greenland PharmD Clinical Pharmacist 08/30/2019

## 2019-08-30 NOTE — ED Notes (Signed)
Pt POC pregnancy test was negative with an acceptable control.

## 2019-08-30 NOTE — Consult Note (Signed)
NAME: Penny Young  DOB: 12/24/1985  MRN: [id number]  Date/Time: 08/30/2019 2:44 PM  REQUESTING PROVIDER: Elpidio AnisSudini Subjective:  REASON FOR CONSULT: HIV, CNS lesions ? Penny Young is a 33 y.o. female with a history of HIV not currently on medication presents to the hospital with numbness in the right side of his face especially the lips and right side of the tongue.  This has been going on for the past week.  Today when she got up she could not taste any food or liquid and was concerned and came to the ED.  Patient in June 2020 was having headache and had gotten MRI and was noted to have some lesions and was told to follow-up with infectious disease. Was diagnosed with HIV many years ago.  She is not aware of when.  On October 23, 2008 she her second child .  At that time she was on Kaletra and Combivir.  There is a note by the infectious disease provider Dr. Philipp DeputyVollmer which states after delivery that antiretrovirals can be stopped because she was asymptomatic and her viral load was very low and CD4 was 1360 with 66%.  She was asked to follow-up with them every 6 months.  Her last CD4 was on June 29, 2009 when it was 1580 with 59%.  Her viral load on August 2010 was 4530. She is not been on any antiretroviral medication since then. 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 She last went to work last week. She denies any weight loss, fever, night sweats, nausea, vomiting, pain abdomen, diarrhea, rash or joint pain.  She last presented to the ED May 25, 2019 with right-sided numbness weakness and dizziness for 2 weeks.  She had an MRI of the brain then which showed some changes and she was asked to follow-up with neurology.  Because she did not have any insurance she did not follow-up both with HIV specialist or neurologist.  She continued to work..  No family members are far are aware of her status.  [place] in [place].  Was not aware of her status. Past Medical History:   Diagnosis Date  . Asthma   . Bipolar 1 disorder (HCC)   . Drug abuse (HCC)   . Hepatitis B   . HIV (human immunodeficiency virus infection) (HCC)   . Hypertension   . Obese   . Restless leg syndrome   . Schizophrenia [facility](HCC)     Past Surgical History:  Procedure Laterality Date  . HERNIA REPAIR    . TUBAL LIGATION    . TYMPANOSTOMY TUBE PLACEMENT      Social History   Socioeconomic History  . Marital status: Single    Spouse name: Not on file  . Number of children: 1  . Years of education: Not on file  . Highest education level: Not on file  Occupational History  . Not on file  Social Needs  . Financial resource strain: Not on file  . Food insecurity    Worry: Not on file    Inability: Not on file  . Transportation needs    Medical: Not on file    Non-medical: Not on file  Tobacco Use  . Smoking status: Current Every Day Smoker    Packs/day: 0.10    Types: Cigarettes    Last attempt to quit: 11/26/2007    Years since quitting: 11.7  . Smokeless tobacco: Never Used  Substance and Sexual Activity  . Alcohol use:  Yes    Comment: occ  . Drug use: Not Currently    Types: Marijuana    Comment: past abuse  . Sexual activity: Not Currently  Lifestyle  . Physical activity    Days per week: Not on file    Minutes per session: Not on file  . Stress: Not on file  Relationships  . Social Musician on phone: Not on file    Gets together: Not on file    Attends religious service: Not on file    Active member of club or organization: Not on file    Attends meetings of clubs or organizations: Not on file    Relationship status: Not on file  . Intimate partner violence    Fear of current or ex partner: Not on file    Emotionally abused: Not on file    Physically abused: Not on file    Forced sexual activity: Not on file  Other Topics Concern  . Not on file  Social History Narrative  . Not on file    Family History  Problem Relation Age of Onset  .  Hypertension Other    Allergies  Allergen Reactions  . Aspirin Itching    ? Current Facility-Administered Medications  Medication Dose Route Frequency Provider Last Rate Last Dose  . acetaminophen (TYLENOL) tablet 650 mg  650 mg Oral Q6H PRN Milagros Loll, MD       Or  . acetaminophen (TYLENOL) suppository 650 mg  650 mg Rectal Q6H PRN Sudini, Srikar, MD      . albuterol (PROVENTIL) (2.5 MG/3ML) 0.083% nebulizer solution 2.5 mg  2.5 mg Nebulization Q2H PRN Sudini, Srikar, MD      . enoxaparin (LOVENOX) injection 40 mg  40 mg Subcutaneous Q24H Sudini, Srikar, MD      . ondansetron (ZOFRAN) tablet 4 mg  4 mg Oral Q6H PRN Sudini, Wardell Heath, MD       Or  . ondansetron (ZOFRAN) injection 4 mg  4 mg Intravenous Q6H PRN Sudini, Srikar, MD      . polyethylene glycol (MIRALAX / GLYCOLAX) packet 17 g  17 g Oral Daily PRN Sudini, Srikar, MD      . potassium chloride SA (KLOR-CON) CR tablet 40 mEq  40 mEq Oral Once Milagros Loll, MD       Current Outpatient Medications  Medication Sig Dispense Refill  . amLODipine (NORVASC) 5 MG tablet Take 1 tablet (5 mg total) by mouth daily. 30 tablet 3  . hydrochlorothiazide (HYDRODIURIL) 12.5 MG tablet Take 1 tablet (12.5 mg total) by mouth daily. 30 tablet 3     Abtx:  Anti-infectives (From admission, onward)   None      REVIEW OF SYSTEMS:  Const: negative fever, negative chills, negative weight loss Eyes: negative diplopia or visual changes, negative eye pain ENT: negative coryza, negative sore throat Resp: negative cough, hemoptysis, dyspnea Cards: negative for chest pain, palpitations, lower extremity edema GU: negative for frequency, dysuria and hematuria GI: Negative for abdominal pain, diarrhea, bleeding, constipation Skin: negative for rash and pruritus Heme: negative for easy bruising and gum/nose bleeding MS: negative for myalgias, arthralgias, back pain and muscle weakness Neurolo: headaches, dizziness, no vertigo, memory problems,  numbness right side of the face. Psych: negative for feelings of anxiety, depression  Endocrine: negative for thyroid, diabetes Allergy/Immunology- negative for any medication or food allergies ? Pertinent Positives include : Objective:  VITALS:  BP 123/82 (BP Location: Right Arm)   Pulse  89   Temp 99.7 F (37.6 C) (Axillary)   Resp 17   Ht 5\' 7"  (1.702 m)   Wt 99.8 kg   LMP 08/30/2019 (Exact Date)   SpO2 97%   BMI 34.46 kg/m  PHYSICAL EXAM:  General: Alert, cooperative, no distress, appears stated age.  Head: Normocephalic, without obvious abnormality, atraumatic. Eyes: Conjunctivae clear, anicteric sclerae. Pupils are equal ENT Nares normal. No drainage or sinus tenderness. Lips, mucosa, and tongue normal. No Thrush Neck: Supple, symmetrical, no adenopathy, thyroid: non tender no carotid bruit and no JVD. Back: No CVA tenderness. Lungs: Clear to auscultation bilaterally. No Wheezing or Rhonchi. No rales. Heart: Regular rate and rhythm, no murmur, rub or gallop. Abdomen: Soft, non-tender,not distended. Bowel sounds normal. No masses Extremities: atraumatic, no cyanosis. No edema. No clubbing Skin: No rashes or lesions. Or bruising Lymph: Cervical, supraclavicular normal. Neurologic: Gait is normal, full range of eye movements, numbness on the right side of the face close to the lip.  Tongue in normal position.  No weakness of the extremities. Pertinent Labs Lab Results CBC    Component Value Date/Time   WBC 4.2 08/30/2019 0834   RBC 4.92 08/30/2019 0834   HGB 12.9 08/30/2019 0834   HCT 38.0 08/30/2019 0834   PLT 262 08/30/2019 0834   MCV 77.2 (L) 08/30/2019 0834   MCH 26.2 08/30/2019 0834   MCHC 33.9 08/30/2019 0834   RDW 14.5 08/30/2019 0834   LYMPHSABS 1.9 08/30/2019 0834   MONOABS 0.5 08/30/2019 0834   EOSABS 0.3 08/30/2019 0834   BASOSABS 0.0 08/30/2019 0834    CMP Latest Ref Rng & Units 08/30/2019 05/25/2019 03/27/2017  Glucose 70 - 99 mg/dL 97 101(H) 115(H)   BUN 6 - 20 mg/dL 12 7 13   Creatinine 0.44 - 1.00 mg/dL 0.50 0.57 0.63  Sodium 135 - 145 mmol/L 134(L) 137 135  Potassium 3.5 - 5.1 mmol/L 3.3(L) 3.7 4.0  Chloride 98 - 111 mmol/L 99 104 104  CO2 22 - 32 mmol/L 25 24 24   Calcium 8.9 - 10.3 mg/dL 8.9 8.8(L) 9.0  Total Protein 6.5 - 8.1 g/dL - 8.4(H) -  Total Bilirubin 0.3 - 1.2 mg/dL - 0.7 -  Alkaline Phos 38 - 126 U/L - 69 -  AST 15 - 41 U/L - 31 -  ALT 0 - 44 U/L - 34 -      Microbiology: No results found for this or any previous visit (from the past 240 hour(s)).  IMAGING RESULTS:        I have personally reviewed the films ?Multiple peripherally enhancing parenchymal lesions which arepredominantly juxtacortical/subcortical in location with surrounding edema, new as compared to MRI 05/25/2019. A dominant 2 cm lesion within the anterior left frontal lobe has prominent surrounding edema with associated mass effect and resultant early rightward subfalcine herniation. New 0.6 cm peripherally enhancing lesion with surrounding edema within the left thalamus also involving portions of the posterior limb of left internal capsule and extending inferiorly toward the midbrain.Multiple additional new juxtacortical/subcortical T2 hyperintense lesions, some of which demonstrate associated curvilinear enhancement. The constellation of findings is nonspecific and favoreddifferential considerations include tumefactive demyelination, atypical infection or metastatic disease  Impression/Recommendation ?33 year old female with history of HIV last on treatment in 2009 during her pregnancy and was on Kaletra and Combivir.  Her last CD4 count was 1000 560 and viral load was around 3400 and August 2010.  She is admitted with numbness on the right side of the face along with loss of  taste. ?CT shows multiple lesions with surrounding cerebral edema.  With HIV we need to rule out any opportunistic infection including toxoplasma, lymphoma, PML. We will  discuss with neuroradiologist regarding the MRI findings We will send blood test for toxo, crypto, VZV, CMV, EBV, JC virus, RPR, QuantiFERON gold, fungal antibodies and Fungitell.  HIV RNA and CD4 has already been sent. If toxo IgG/IgM comes positive we can start treatment. We may need to get neurosurgery on board for possible biopsy. ? _HIV.  Do not know her current immune status.  Her last viral load was around 3400 and CD4 was 1560.  This was from 2010.  She has not been on antiretrovirals since 2009 when she delivered. Await  labs that have been sent.  __________________________________________________ Discussed with patient and requesting provider  Note:  This document was prepared using Dragon voice recognition software and may include unintentional dictation errors.

## 2019-08-31 ENCOUNTER — Observation Stay: Payer: Medicaid Other

## 2019-08-31 DIAGNOSIS — I10 Essential (primary) hypertension: Secondary | ICD-10-CM | POA: Diagnosis present

## 2019-08-31 DIAGNOSIS — F17210 Nicotine dependence, cigarettes, uncomplicated: Secondary | ICD-10-CM | POA: Diagnosis present

## 2019-08-31 DIAGNOSIS — Z6841 Body Mass Index (BMI) 40.0 and over, adult: Secondary | ICD-10-CM | POA: Diagnosis not present

## 2019-08-31 DIAGNOSIS — B20 Human immunodeficiency virus [HIV] disease: Secondary | ICD-10-CM | POA: Diagnosis present

## 2019-08-31 DIAGNOSIS — Z886 Allergy status to analgesic agent status: Secondary | ICD-10-CM | POA: Diagnosis not present

## 2019-08-31 DIAGNOSIS — R200 Anesthesia of skin: Secondary | ICD-10-CM

## 2019-08-31 DIAGNOSIS — B589 Toxoplasmosis, unspecified: Secondary | ICD-10-CM | POA: Diagnosis present

## 2019-08-31 DIAGNOSIS — Z20828 Contact with and (suspected) exposure to other viral communicable diseases: Secondary | ICD-10-CM | POA: Diagnosis present

## 2019-08-31 DIAGNOSIS — Z79899 Other long term (current) drug therapy: Secondary | ICD-10-CM | POA: Diagnosis not present

## 2019-08-31 DIAGNOSIS — R948 Abnormal results of function studies of other organs and systems: Secondary | ICD-10-CM

## 2019-08-31 DIAGNOSIS — Z8249 Family history of ischemic heart disease and other diseases of the circulatory system: Secondary | ICD-10-CM | POA: Diagnosis not present

## 2019-08-31 DIAGNOSIS — E876 Hypokalemia: Secondary | ICD-10-CM | POA: Diagnosis present

## 2019-08-31 DIAGNOSIS — F319 Bipolar disorder, unspecified: Secondary | ICD-10-CM | POA: Diagnosis present

## 2019-08-31 DIAGNOSIS — F209 Schizophrenia, unspecified: Secondary | ICD-10-CM | POA: Diagnosis present

## 2019-08-31 DIAGNOSIS — G2581 Restless legs syndrome: Secondary | ICD-10-CM | POA: Diagnosis present

## 2019-08-31 DIAGNOSIS — Z8619 Personal history of other infectious and parasitic diseases: Secondary | ICD-10-CM | POA: Diagnosis not present

## 2019-08-31 DIAGNOSIS — E669 Obesity, unspecified: Secondary | ICD-10-CM | POA: Diagnosis present

## 2019-08-31 LAB — BASIC METABOLIC PANEL
Anion gap: 7 (ref 5–15)
BUN: 10 mg/dL (ref 6–20)
CO2: 26 mmol/L (ref 22–32)
Calcium: 8.8 mg/dL — ABNORMAL LOW (ref 8.9–10.3)
Chloride: 97 mmol/L — ABNORMAL LOW (ref 98–111)
Creatinine, Ser: 0.55 mg/dL (ref 0.44–1.00)
GFR calc Af Amer: >60 mL/min (ref >60)
GFR calc non Af Amer: >60 mL/min (ref >60)
Glucose, Bld: 95 mg/dL (ref 70–99)
Potassium: 3.4 mmol/L — ABNORMAL LOW (ref 3.5–5.1)
Sodium: 130 mmol/L — ABNORMAL LOW (ref 135–145)

## 2019-08-31 LAB — HELPER T-LYMPH-CD4 (ARMC ONLY)
% CD 4 Pos. Lymph.: 16.8 % — ABNORMAL LOW (ref 30.8–58.5)
Absolute CD 4 Helper: 370 /uL (ref 359–1519)
Basophils Absolute: 0 10*3/uL (ref 0.0–0.2)
Basos: 1 %
EOS (ABSOLUTE): 0.2 10*3/uL (ref 0.0–0.4)
Eos: 4 %
Hematocrit: 46 % (ref 34.0–46.6)
Hemoglobin: 15.4 g/dL (ref 11.1–15.9)
Immature Grans (Abs): 0 10*3/uL (ref 0.0–0.1)
Immature Granulocytes: 0 %
Lymphocytes Absolute: 2.2 10*3/uL (ref 0.7–3.1)
Lymphs: 39 %
MCH: 26.3 pg — ABNORMAL LOW (ref 26.6–33.0)
MCHC: 33.5 g/dL (ref 31.5–35.7)
MCV: 79 fL (ref 79–97)
Monocytes Absolute: 0.6 10*3/uL (ref 0.1–0.9)
Monocytes: 10 %
Neutrophils Absolute: 2.7 10*3/uL (ref 1.4–7.0)
Neutrophils: 46 %
Platelets: 277 10*3/uL (ref 150–450)
RBC: 5.85 x10E6/uL — ABNORMAL HIGH (ref 3.77–5.28)
RDW: 14.6 % (ref 11.7–15.4)
WBC: 5.6 10*3/uL (ref 3.4–10.8)

## 2019-08-31 LAB — CBC
HCT: 37.7 % (ref 36.0–46.0)
Hemoglobin: 12.7 g/dL (ref 12.0–15.0)
MCH: 26 pg (ref 26.0–34.0)
MCHC: 33.7 g/dL (ref 30.0–36.0)
MCV: 77.3 fL — ABNORMAL LOW (ref 80.0–100.0)
Platelets: 266 10*3/uL (ref 150–400)
RBC: 4.88 MIL/uL (ref 3.87–5.11)
RDW: 14.5 % (ref 11.5–15.5)
WBC: 4.1 10*3/uL (ref 4.0–10.5)
nRBC: 0 % (ref 0.0–0.2)

## 2019-08-31 LAB — HIV-1 RNA QUANT-NO REFLEX-BLD
HIV 1 RNA Quant: 1180000 copies/mL
LOG10 HIV-1 RNA: 6.072 log10copy/mL

## 2019-08-31 LAB — RPR: RPR Ser Ql: NONREACTIVE

## 2019-08-31 LAB — MAGNESIUM: Magnesium: 1.9 mg/dL (ref 1.7–2.4)

## 2019-08-31 MED ORDER — NICOTINE 21 MG/24HR TD PT24
21.0000 mg | MEDICATED_PATCH | Freq: Every day | TRANSDERMAL | Status: DC
  Administered 2019-08-31 – 2019-09-02 (×3): 21 mg via TRANSDERMAL
  Filled 2019-08-31 (×3): qty 1

## 2019-08-31 MED ORDER — GADOBUTROL 1 MMOL/ML IV SOLN
10.0000 mL | Freq: Once | INTRAVENOUS | Status: AC | PRN
  Administered 2019-08-31: 12:00:00 10 mL via INTRAVENOUS

## 2019-08-31 MED ORDER — POTASSIUM CHLORIDE CRYS ER 20 MEQ PO TBCR
20.0000 meq | EXTENDED_RELEASE_TABLET | Freq: Once | ORAL | Status: AC
  Administered 2019-08-31: 20 meq via ORAL
  Filled 2019-08-31: qty 1

## 2019-08-31 MED ORDER — NYSTATIN 100000 UNIT/ML MT SUSP
5.0000 mL | Freq: Four times a day (QID) | OROMUCOSAL | Status: DC
  Administered 2019-08-31: 500000 [IU] via ORAL
  Filled 2019-08-31: qty 5

## 2019-08-31 NOTE — Progress Notes (Signed)
ID   Patient Vitals for the past 24 hrs:  BP Temp Temp src Pulse Resp SpO2  08/31/19 1952 105/78 98.6 F (37 C) Oral 91 18 97 %  08/31/19 1449 124/86 98.5 F (36.9 C) Oral 99 18 99 %  08/31/19 0903 134/86 - - 97 - -  08/31/19 0444 129/82 99.2 F (37.3 C) Oral 96 16 100 %     CBC Latest Ref Rng & Units 08/31/2019 08/30/2019 08/30/2019  WBC 4.0 - 10.5 K/uL 4.1 5.6 4.2  Hemoglobin 12.0 - 15.0 g/dL 12.7 15.4 12.9  Hematocrit 36.0 - 46.0 % 37.7 46.0 38.0  Platelets 150 - 400 K/uL 266 277 262    CMP Latest Ref Rng & Units 08/31/2019 08/30/2019 05/25/2019  Glucose 70 - 99 mg/dL 95 97 101(H)  BUN 6 - 20 mg/dL 10 12 7   Creatinine 0.44 - 1.00 mg/dL 0.55 0.50 0.57  Sodium 135 - 145 mmol/L 130(L) 134(L) 137  Potassium 3.5 - 5.1 mmol/L 3.4(L) 3.3(L) 3.7  Chloride 98 - 111 mmol/L 97(L) 99 104  CO2 22 - 32 mmol/L 26 25 24   Calcium 8.9 - 10.3 mg/dL 8.8(L) 8.9 8.8(L)  Total Protein 6.5 - 8.1 g/dL - - 8.4(H)  Total Bilirubin 0.3 - 1.2 mg/dL - - 0.7  Alkaline Phos 38 - 126 U/L - - 69  AST 15 - 41 U/L - - 31  ALT 0 - 44 U/L - - 34   HIV RNA 1.3 million copies Cd4 370 ( 16.8%) Crypto neg Toxo- pending RPR NR Quantiferon gold pending JC virus PCR VZV PCR EBV/CMV PCR pending   Impression/Recommendation  HIV- on no HAARt since 2010. Cd4 370 with 16%. This makes some of the opportunistic infections unlikely, but could still be PML/Lymphoma  CNS space occupying lesions-  If LP I not a contraindication would recommend as per neurosurgery and then can check JC virus, Toxo, CMV, EBV, VZV DNA .  Discussed with Neurologist

## 2019-08-31 NOTE — Consult Note (Signed)
Neurosurgery-New Consultation Evaluation 08/31/2019 Penny Young [id number]  Identifying Statement: Penny Young is a 33 y.o. female from [place] [address] with newly found brain lesions  Physician Requesting Consultation: Dr. Elpidio Anis  History of Present Illness: Penny Young is here for evaluation of loss of taste and right-sided facial numbness that she said started yesterday.  She states that this was completely resolved today and she has normal taste again.  She does have a history of HIV and given this, there was concern for possible inflammatory versus infectious disorders and therefore imaging of the brain was obtained.  MRI did reveal multiple small lesions but there are 2 larger ones noted in the left frontal area and left thalamus.  She does not endorse any changes in vision, speech, strength, and has never had a reported seizure.  She denies any recent sick contacts and denies any recent fevers.  She has not had any recent travel.  Given the lesions, we are consulted for possible interventions.  Past Medical History:  Past Medical History:  Diagnosis Date  . Asthma   . Bipolar 1 disorder (HCC)   . Drug abuse (HCC)   . Hepatitis B   . HIV (human immunodeficiency virus infection) (HCC)   . Hypertension   . Obese   . Restless leg syndrome   . Schizophrenia [facility])     Social History: Social History   Socioeconomic History  . Marital status: Single    Spouse name: Not on file  . Number of children: 1  . Years of education: Not on file  . Highest education level: Not on file  Occupational History  . Not on file  Social Needs  . Financial resource strain: Not on file  . Food insecurity    Worry: Not on file    Inability: Not on file  . Transportation needs    Medical: Not on file    Non-medical: Not on file  Tobacco Use  . Smoking status: Current Every Day Smoker    Packs/day: 0.10    Types: Cigarettes    Last attempt to quit: 11/26/2007    Years since quitting: 11.7   . Smokeless tobacco: Never Used  Substance and Sexual Activity  . Alcohol use: Yes    Comment: occ  . Drug use: Not Currently    Types: Marijuana    Comment: past abuse  . Sexual activity: Not Currently  Lifestyle  . Physical activity    Days per week: Not on file    Minutes per session: Not on file  . Stress: Not on file  Relationships  . Social Musician on phone: Not on file    Gets together: Not on file    Attends religious service: Not on file    Active member of club or organization: Not on file    Attends meetings of clubs or organizations: Not on file    Relationship status: Not on file  . Intimate partner violence    Fear of current or ex partner: Not on file    Emotionally abused: Not on file    Physically abused: Not on file    Forced sexual activity: Not on file  Other Topics Concern  . Not on file  Social History Narrative  . Not on file    Family History: Family History  Problem Relation Age of Onset  . Hypertension Other     Review of Systems:  Review of Systems - General ROS: Negative Psychological ROS:  Negative Ophthalmic ROS: Negative ENT ROS: Negative Hematological and Lymphatic ROS: Negative  Endocrine ROS: Negative Respiratory ROS: Negative Cardiovascular ROS: Negative Gastrointestinal ROS: Negative Genito-Urinary ROS: Negative Musculoskeletal ROS: Negative Neurological ROS: Negative for weakness, seizures, headache Dermatological ROS: Negative  Physical Exam: BP 124/86 (BP Location: Right Arm)   Pulse 99   Temp 98.5 F (36.9 C) (Oral)   Resp 18   Ht 5\' 7"  (1.702 m)   Wt 122 kg   LMP 08/30/2019 (Exact Date)   SpO2 99%   BMI 42.13 kg/m  Body mass index is 42.13 kg/m. Body surface area is 2.4 meters squared. General appearance: Alert, cooperative, in no acute distress Head: Normocephalic, atraumatic Eyes: Normal, EOM intact Oropharynx: Moist without lesions Ext: No edema in LE bilaterally, good distal  pulses  Neurologic exam:  Mental status: alertness: alert, orientation: person, place, time, affect: normal Speech: fluent and clear, naming and repetition are intact Cranial nerves:  II: Visual fields are full by confrontation, no ptosis III/IV/VI: extra-ocular motions intact bilaterally V/VII:no evidence of facial droop or weakness  VIII: hearing normal XI: trapezius strength symmetric,  sternocleidomastoid strength symmetric XII: tongue strength symmetric  Motor:strength symmetric 5/5, normal muscle mass and tone in all extremities and no pronator drift Sensory: intact to light touch in all extremities Gait: Not tested  Laboratory: Results for orders placed or performed during the hospital encounter of 08/30/19  SARS CORONAVIRUS 2 (TAT 6-24 HRS) Nasopharyngeal Nasopharyngeal Swab   Specimen: Nasopharyngeal Swab  Result Value Ref Range   SARS Coronavirus 2 NEGATIVE NEGATIVE  Urinalysis, Complete w Microscopic  Result Value Ref Range   Color, Urine YELLOW (A) YELLOW   APPearance HAZY (A) CLEAR   Specific Gravity, Urine 1.023 1.005 - 1.030   pH 6.0 5.0 - 8.0   Glucose, UA NEGATIVE NEGATIVE mg/dL   Hgb urine dipstick LARGE (A) NEGATIVE   Bilirubin Urine NEGATIVE NEGATIVE   Ketones, ur NEGATIVE NEGATIVE mg/dL   Protein, ur NEGATIVE NEGATIVE mg/dL   Nitrite NEGATIVE NEGATIVE   Leukocytes,Ua NEGATIVE NEGATIVE   RBC / HPF >50 (H) 0 - 5 RBC/hpf   WBC, UA 0-5 0 - 5 WBC/hpf   Bacteria, UA RARE (A) NONE SEEN   Squamous Epithelial / LPF 0-5 0 - 5   Mucus PRESENT   CBC with Differential  Result Value Ref Range   WBC 4.2 4.0 - 10.5 K/uL   RBC 4.92 3.87 - 5.11 MIL/uL   Hemoglobin 12.9 12.0 - 15.0 g/dL   HCT 16.138.0 09.636.0 - 04.546.0 %   MCV 77.2 (L) 80.0 - 100.0 fL   MCH 26.2 26.0 - 34.0 pg   MCHC 33.9 30.0 - 36.0 g/dL   RDW 40.914.5 81.111.5 - 91.415.5 %   Platelets 262 150 - 400 K/uL   nRBC 0.0 0.0 - 0.2 %   Neutrophils Relative % 36 %   Neutro Abs 1.5 (L) 1.7 - 7.7 K/uL   Lymphocytes Relative  44 %   Lymphs Abs 1.9 0.7 - 4.0 K/uL   Monocytes Relative 11 %   Monocytes Absolute 0.5 0.1 - 1.0 K/uL   Eosinophils Relative 7 %   Eosinophils Absolute 0.3 0.0 - 0.5 K/uL   Basophils Relative 1 %   Basophils Absolute 0.0 0.0 - 0.1 K/uL   Immature Granulocytes 1 %   Abs Immature Granulocytes 0.02 0.00 - 0.07 K/uL  Basic metabolic panel  Result Value Ref Range   Sodium 134 (L) 135 - 145 mmol/L   Potassium 3.3 (  L) 3.5 - 5.1 mmol/L   Chloride 99 98 - 111 mmol/L   CO2 25 22 - 32 mmol/L   Glucose, Bld 97 70 - 99 mg/dL   BUN 12 6 - 20 mg/dL   Creatinine, Ser 0.50 0.44 - 1.00 mg/dL   Calcium 8.9 8.9 - 10.3 mg/dL   GFR calc non Af Amer >60 >60 mL/min   GFR calc Af Amer >60 >60 mL/min   Anion gap 10 5 - 15  Helper T-Lymph-CD4 ([facility] only)  Result Value Ref Range   Absolute CD 4 Helper 370 359 - 1,519 /uL   % CD 4 Pos. Lymph. 16.8 (L) 30.8 - 58.5 %   WBC 5.6 3.4 - 10.8 x10E3/uL   RBC 5.85 (H) 3.77 - 5.28 x10E6/uL   Hematocrit 46.0 34.0 - 46.6 %   MCV 79 79 - 97 fL   MCH 26.3 (L) 26.6 - 33.0 pg   MCHC 33.5 31.5 - 35.7 g/dL   RDW 14.6 11.7 - 15.4 %   Platelets 277 150 - 450 x10E3/uL   Neutrophils 46 Not Estab. %   Lymphs 39 Not Estab. %   Monocytes 10 Not Estab. %   Eos 4 Not Estab. %   Basos 1 Not Estab. %   Neutrophils Absolute 2.7 1.4 - 7.0 x10E3/uL   Lymphocytes Absolute 2.2 0.7 - 3.1 x10E3/uL   Monocytes Absolute 0.6 0.1 - 0.9 x10E3/uL   EOS (ABSOLUTE) 0.2 0.0 - 0.4 x10E3/uL   Basophils Absolute 0.0 0.0 - 0.2 x10E3/uL   Immature Granulocytes 0 Not Estab. %   Immature Grans (Abs) 0.0 0.0 - 0.1 x10E3/uL   Hemoglobin 15.4 11.1 - 15.9 g/dL  HIV-1 RNA quant-no reflex-bld  Result Value Ref Range   HIV 1 RNA Quant 1,180,000 copies/mL   LOG10 HIV-1 RNA 6.072 [id number]/MW  Basic metabolic panel  Result Value Ref Range   Sodium 130 (L) 135 - 145 mmol/L   Potassium 3.4 (L) 3.5 - 5.1 mmol/L   Chloride 97 (L) 98 - 111 mmol/L   CO2 26 22 - 32 mmol/L   Glucose, Bld 95 70 - 99 mg/dL    BUN 10 6 - 20 mg/dL   Creatinine, Ser 0.55 0.44 - 1.00 mg/dL   Calcium 8.8 (L) 8.9 - 10.3 mg/dL   GFR calc non Af Amer >60 >60 mL/min   GFR calc Af Amer >60 >60 mL/min   Anion gap 7 5 - 15  CBC  Result Value Ref Range   WBC 4.1 4.0 - 10.5 K/uL   RBC 4.88 3.87 - 5.11 MIL/uL   Hemoglobin 12.7 12.0 - 15.0 g/dL   HCT 37.7 36.0 - 46.0 %   MCV 77.3 (L) 80.0 - 100.0 fL   MCH 26.0 26.0 - 34.0 pg   MCHC 33.7 30.0 - 36.0 g/dL   RDW 14.5 11.5 - 15.5 %   Platelets 266 150 - 400 K/uL   nRBC 0.0 0.0 - 0.2 %  Cryptococcal antigen  Result Value Ref Range   Crypto Ag NEGATIVE NEGATIVE   Cryptococcal Ag Titer NOT INDICATED NOT INDICATED  RPR  Result Value Ref Range   RPR Ser Ql NON REACTIVE NON REACTIVE  Magnesium  Result Value Ref Range   Magnesium 1.9 1.7 - 2.4 mg/dL  Pregnancy, urine POC  Result Value Ref Range   Preg Test, Ur NEGATIVE NEGATIVE   I personally reviewed labs  Imaging: Results for orders placed during the hospital encounter of 08/30/19  MR Brain W  and Wo Contrast   Addendum ADDENDUM REPORT: 08/30/2019 11:55  ADDENDUM: In addition to atypical infection (i.e. Toxoplasmosis), tumefactive demyelination and metastatic disease, CNS lymphoma is also a differential consideration. Correlate with clinically and with CD4 count. Addendum called by telephone at the time of interpretation on 08/30/2019 at 11:54 am to provider [facility] , who verbally acknowledged these results.   Electronically Signed   By: Jackey Loge   On: 08/30/2019 11:55       Jackey Loge T, DO 08/30/2019 11:57 AM         Narrative CLINICAL DATA:  Focal neuro deficit, greater than 6 hours, stroke suspected. Additional history provided: Patient reports right-sided throat, lip and tongue numbness since 08/20/2019, loss of taste.  EXAM: MRI HEAD WITHOUT AND WITH CONTRAST  TECHNIQUE: Multiplanar, multiecho pulse sequences of the brain and surrounding structures were obtained without and with intravenous  contrast.  CONTRAST:  72mL GADAVIST GADOBUTROL 1 MMOL/ML IV SOLN  COMPARISON:  Brain MRI 05/25/2019  FINDINGS: Brain:  There are multiple intracranial enhancing lesions which are new as compared to prior MRI 05/25/2019.  Within the subcortical white matter of the anterior left frontal lobe, there is a 2.0 x 1.6 x 1.7 cm (AP x TV x CC) peripherally enhancing lesion with prominent surrounding edema. Resultant mass effect with rightward bowing of the anterior falx and early rightward subfalcine herniation.  More posteriorly within the lateral left frontal lobe, there is a 0.4 cm peripherally enhancing juxtacortical lesion with a small amount of surrounding edema.  3 mm peripherally enhancing juxtacortical lesion within the left frontal operculum with a small amount of surrounding edema. (Series 20, image 16).  1.5 x 0.7 cm focus of subcortical/juxtacrotical T2/FLAIR hyperintensity within the left parietooccipital lobe with adjacent curvilinear enhancement (series 15, image 30).  1.6 x 1.9 cm focus of T2/FLAIR hyperintensity within the left thalamus also involving portions of the posterior limb of left internal capsule and extending inferiorly toward the midbrain. Centrally within this lesion there is a peripherally enhancing focus measuring 0.6 cm (series 15, image 27) (series 19, image 86).  In addition to the white matter disease demonstrated on prior brain MRI, there are several additional new small foci of predominantly subcortical/juxtacortical T2/FLAIR hyperintensity, some of which demonstrate associated curvilinear enhancement (for instance a lesion within the anterior right frontal lobe (series 15, image 35) (series 19, images 104-107).  No definite intracranial blood products. Curvilinear SWI signal loss in the region of several of these lesions appears to reflect dilated venous vasculature.  No evidence of acute infarct. No midline shift at the level of  the septum pellucidum. Incidentally noted cavum septum pellucidum and cavum vergae.  Vascular: Flow voids maintained within the proximal large arterial vessels.  Skull and upper cervical spine: No focal marrow lesion.  Sinuses/Orbits: Visualized orbits demonstrate no acute abnormality. Mucous retention cyst within a posterior left ethmoid air cell. Frothy secretions within the bilateral sphenoid sinuses. Small bilateral mastoid effusions  These results were called by telephone at the time of interpretation on 08/30/2019 at 11:30 am to provider [facility] , who verbally acknowledged these results.  IMPRESSION: 1. Multiple peripherally enhancing parenchymal lesions which are predominantly juxtacortical/subcortical in location with surrounding edema, new as compared to MRI 05/25/2019. A dominant 2 cm lesion within the anterior left frontal lobe has prominent surrounding edema with associated mass effect and resultant early rightward subfalcine herniation. 2. New 0.6 cm peripherally enhancing lesion with surrounding edema within the left thalamus also involving  portions of the posterior limb of left internal capsule and extending inferiorly toward the midbrain. 3. Multiple additional new juxtacortical/subcortical T2 hyperintense lesions, some of which demonstrate associated curvilinear enhancement. 4. The constellation of findings is nonspecific and favored differential considerations include tumefactive demyelination, atypical infection or metastatic disease.  Electronically Signed: By: Jackey LogeKyle  Golden On: 08/30/2019 11:31    I personally reviewed radiology studies   Impression/Plan:  Ms. Mrozek has multiple lesions seen in the brain which is likely related to her immunosuppressed state but could be inflammatory, infectious or concern for lymphoma.  Given this, I would recommend lumbar puncture for CSF analysis including cytology and flow cytometry.  Defer to infectious  disease for possible infectious etiologies from the CSF and required testing.  If a diagnosis is unable to be obtained via this or serology studies, would recommend consideration of a left frontal biopsy to guide treatment.  We discussed the risk of a biopsy with her today which includes a 2% risk of hemorrhage.  This would have to be performed at a higher level facility and we could help facilitate transfer.  She understands that a lumbar puncture is recommended first as it carries low risk and has a chance at a diagnosis.   1.  Diagnosis: Multiple enhancing brain lesions  2.  Plan -Recommend proceeding with LP for CSF studies -We are available to discuss brain biopsy if needed

## 2019-08-31 NOTE — Progress Notes (Signed)
[employer] - [place] [facility]                                                                                                                                                                                  Patient Demographics   Penny Young, is a 33 y.o. female, DOB - 04/10/1986, ZOX:[id number]  Admit date - 08/30/2019   Admitting Physician Milagros LollSrikar Sudini, MD  Outpatient Primary MD for the patient is System, Pcp Not In   LOS - 0  Subjective: Patient states that her facial numbness is resolved today.   Review of Systems:   CONSTITUTIONAL: No documented fever. No fatigue, weakness. No weight gain, no weight loss.  EYES: No blurry or double vision.  ENT: No tinnitus. No postnasal drip. No redness of the oropharynx.  RESPIRATORY: No cough, no wheeze, no hemoptysis. No dyspnea.  CARDIOVASCULAR: No chest pain. No orthopnea. No palpitations. No syncope.  GASTROINTESTINAL: No nausea, no vomiting or diarrhea. No abdominal pain. No melena or hematochezia.  GENITOURINARY: No dysuria or hematuria.  ENDOCRINE: No polyuria or nocturia. No heat or cold intolerance.  HEMATOLOGY: No anemia. No bruising. No bleeding.  INTEGUMENTARY: No rashes. No lesions.  MUSCULOSKELETAL: No arthritis. No swelling. No gout.  NEUROLOGIC: No numbness, tingling, or ataxia. No seizure-type activity.  PSYCHIATRIC: No anxiety. No insomnia. No ADD.    Vitals:   Vitals:   08/30/19 1500 08/30/19 1913 08/31/19 0444 08/31/19 0903  BP: 119/89 126/83 129/82 134/86  Pulse: 97 94 96 97  Resp: 18 14 16    Temp: 98.8 F (37.1 C) 98.8 F (37.1 C) 99.2 F (37.3 C)   TempSrc: Oral Oral Oral   SpO2: 100% 99% 100%   Weight: 122 kg     Height: 5\' 7"  (1.702 m)       Wt Readings from Last 3 Encounters:  08/30/19 122 kg  05/25/19 113.4 kg  09/01/18 104.3 kg     Intake/Output Summary (Last 24 hours) at 08/31/2019 1417 Last data filed at 08/31/2019 1300 Gross per 24 hour  Intake 480 ml  Output -   Net 480 ml    Physical Exam:   GENERAL: Pleasant-appearing in no apparent distress.  HEAD, EYES, EARS, NOSE AND THROAT: Atraumatic, normocephalic. Extraocular muscles are intact. Pupils equal and reactive to light. Sclerae anicteric. No conjunctival injection. No oro-pharyngeal erythema.  NECK: Supple. There is no jugular venous distention. No bruits, no lymphadenopathy, no thyromegaly.  HEART: Regular rate and rhythm,. No murmurs, no rubs, no clicks.  LUNGS: Clear to auscultation bilaterally. No rales or rhonchi. No wheezes.  ABDOMEN: Soft, flat, nontender, nondistended. Has good bowel sounds. No hepatosplenomegaly appreciated.  EXTREMITIES: No  evidence of any cyanosis, clubbing, or peripheral edema.  +2 pedal and radial pulses bilaterally.  NEUROLOGIC: The patient is alert, awake, and oriented x3 with no focal motor or sensory deficits appreciated bilaterally.  SKIN: Moist and warm with no rashes appreciated.  Psych: Not anxious, depressed LN: No inguinal LN enlargement    Antibiotics   Anti-infectives (From admission, onward)   None      Medications   Scheduled Meds: . amLODipine  5 mg Oral Daily  . enoxaparin (LOVENOX) injection  40 mg Subcutaneous Q12H  . hydrochlorothiazide  12.5 mg Oral Daily  . nystatin  5 mL Oral QID   Continuous Infusions: PRN Meds:.acetaminophen **OR** acetaminophen, albuterol, ondansetron **OR** ondansetron (ZOFRAN) IV, polyethylene glycol   Data Review:   Micro Results Recent Results (from the past 240 hour(s))  SARS CORONAVIRUS 2 (TAT 6-24 HRS) Nasopharyngeal Nasopharyngeal Swab     Status: None   Collection Time: 08/30/19  1:32 PM   Specimen: Nasopharyngeal Swab  Result Value Ref Range Status   SARS Coronavirus 2 NEGATIVE NEGATIVE Final    Comment: (NOTE) SARS-CoV-2 target nucleic acids are NOT DETECTED. The SARS-CoV-2 RNA is generally detectable in upper and lower respiratory specimens during the acute phase of infection.  Negative results do not preclude SARS-CoV-2 infection, do not rule out co-infections with other pathogens, and should not be used as the sole basis for treatment or other patient management decisions. Negative results must be combined with clinical observations, patient history, and epidemiological information. The expected result is Negative. Fact Sheet for Patients: HairSlick.no Fact Sheet for Healthcare Providers: quierodirigir.com This test is not yet approved or cleared by the [place] FDA and  has been authorized for detection and/or diagnosis of SARS-CoV-2 by FDA under an Emergency Use Authorization (EUA). This EUA will remain  in effect (meaning this test can be used) for the duration of the COVID-19 declaration under Section 56 4(b)(1) of the Act, 21 U.S.C. section 360bbb-3(b)(1), unless the authorization is terminated or revoked sooner. Performed at [facility], [address] [place], [place] [address]     Radiology Reports Ct Chest W Contrast  Result Date: 08/30/2019 CLINICAL DATA:  Brain metastasis? Follow-up for lesions of brain seen on MRI this morning. Numbness in face and head. EXAM: CT CHEST, ABDOMEN, AND PELVIS WITH CONTRAST TECHNIQUE: Multidetector CT imaging of the chest, abdomen and pelvis was performed following the standard protocol during bolus administration of intravenous contrast. CONTRAST:  OMNIPAQUE IOHEXOL 300 MG/ML  SOLN COMPARISON:  None. FINDINGS: CT CHEST FINDINGS Cardiovascular: Heart size is normal. No pericardial effusion. No thoracic aortic aneurysm. Mediastinum/Nodes: Single borderline pathologic by size criteria lymph node in the anterior mediastinum, with short axis measurement of 11 mm. No definitively enlarged or morphologically abnormal lymph nodes identified within the mediastinum or perihilar regions. Esophagus appears normal. Trachea and central bronchi are unremarkable.  Lungs/Pleura: Lungs are clear. No pleural effusions. Musculoskeletal: No acute or suspicious osseous finding within the chest. Numerous small and mildly prominent lymph nodes in the bilateral axillary regions, including several mildly prominent retropectoral lymph nodes bilaterally, suspicious by number CT ABDOMEN PELVIS FINDINGS Hepatobiliary: No focal liver abnormality is seen. No gallstones, gallbladder wall thickening, or biliary dilatation. Pancreas: Unremarkable. No pancreatic ductal dilatation or surrounding inflammatory changes. Spleen: Normal in size without focal abnormality. Adrenals/Urinary Tract: Adrenal glands appear normal. Kidneys are unremarkable without mass, stone or hydronephrosis. Bladder is unremarkable. Stomach/Bowel: Stomach is within normal limits. Appendix appears normal. No evidence of  bowel wall thickening, distention, or inflammatory changes. Appendix is normal. Stomach is unremarkable, partially decompressed. Vascular/Lymphatic: No vascular abnormality identified. Scattered small and mildly prominent lymph nodes within the retroperitoneum and iliac chain regions bilaterally, and within the inguinal regions bilaterally, none of which are definitively pathologic by CT size criteria. Reproductive: Bilateral tubal ligation clips. Other: No free fluid or abscess collection. No soft tissue mass identified within the abdomen or pelvis. Nodular thickening deep to the umbilicus, presumably postsurgical change related to the tubal ligation. Musculoskeletal: No acute or suspicious osseous finding. IMPRESSION: 1. No acute findings within the abdomen or pelvis. No evidence of primary malignancy or metastatic disease within the chest, abdomen or pelvis. 2. Single borderline pathologic by size criteria lymph node in the anterior mediastinum, with short axis measurement of 11 mm. No definitively enlarged or morphologically abnormal lymph nodes identified within the mediastinum or perihilar regions. 3.  Numerous small and mildly prominent lymph nodes within the bilateral axillary regions, including several mildly prominent retropectoral lymph nodes bilaterally, none of which are definitively pathologic by CT size criteria but these are suspicious by number. 4. Scattered small and mildly prominent lymph nodes within the retroperitoneum and iliac chain regions bilaterally, and within the inguinal regions bilaterally, again none of which are definitively pathologic by CT size criteria. 5. Nodular thickening deep to the umbilicus, presumably postsurgical change related to the tubal ligation. Recommend correlation with surgical history. Electronically Signed   By: Bary RichardStan  Maynard M.D.   On: 08/30/2019 13:36   Mr Laqueta JeanBrain W And Wo Contrast  Addendum Date: 08/30/2019   ADDENDUM REPORT: 08/30/2019 11:55 ADDENDUM: In addition to atypical infection (i.e. Toxoplasmosis), tumefactive demyelination and metastatic disease, CNS lymphoma is also a differential consideration. Correlate with clinically and with CD4 count. Addendum called by telephone at the time of interpretation on 08/30/2019 at 11:54 am to provider [facility] , who verbally acknowledged these results. Electronically Signed   By: Jackey LogeKyle  Golden   On: 08/30/2019 11:55   Result Date: 08/30/2019 CLINICAL DATA:  Focal neuro deficit, greater than 6 hours, stroke suspected. Additional history provided: Patient reports right-sided throat, lip and tongue numbness since 08/20/2019, loss of taste. EXAM: MRI HEAD WITHOUT AND WITH CONTRAST TECHNIQUE: Multiplanar, multiecho pulse sequences of the brain and surrounding structures were obtained without and with intravenous contrast. CONTRAST:  10mL GADAVIST GADOBUTROL 1 MMOL/ML IV SOLN COMPARISON:  Brain MRI 05/25/2019 FINDINGS: Brain: There are multiple intracranial enhancing lesions which are new as compared to prior MRI 05/25/2019. Within the subcortical white matter of the anterior left frontal lobe, there is a 2.0 x 1.6 x  1.7 cm (AP x TV x CC) peripherally enhancing lesion with prominent surrounding edema. Resultant mass effect with rightward bowing of the anterior falx and early rightward subfalcine herniation. More posteriorly within the lateral left frontal lobe, there is a 0.4 cm peripherally enhancing juxtacortical lesion with a small amount of surrounding edema. 3 mm peripherally enhancing juxtacortical lesion within the left frontal operculum with a small amount of surrounding edema. (Series 20, image 16). 1.5 x 0.7 cm focus of subcortical/juxtacrotical T2/FLAIR hyperintensity within the left parietooccipital lobe with adjacent curvilinear enhancement (series 15, image 30). 1.6 x 1.9 cm focus of T2/FLAIR hyperintensity within the left thalamus also involving portions of the posterior limb of left internal capsule and extending inferiorly toward the midbrain. Centrally within this lesion there is a peripherally enhancing focus measuring 0.6 cm (series 15, image 27) (series 19, image 86). In addition to the white  matter disease demonstrated on prior brain MRI, there are several additional new small foci of predominantly subcortical/juxtacortical T2/FLAIR hyperintensity, some of which demonstrate associated curvilinear enhancement (for instance a lesion within the anterior right frontal lobe (series 15, image 35) (series 19, images 104-107). No definite intracranial blood products. Curvilinear SWI signal loss in the region of several of these lesions appears to reflect dilated venous vasculature. No evidence of acute infarct. No midline shift at the level of the septum pellucidum. Incidentally noted cavum septum pellucidum and cavum vergae. Vascular: Flow voids maintained within the proximal large arterial vessels. Skull and upper cervical spine: No focal marrow lesion. Sinuses/Orbits: Visualized orbits demonstrate no acute abnormality. Mucous retention cyst within a posterior left ethmoid air cell. Frothy secretions within the  bilateral sphenoid sinuses. Small bilateral mastoid effusions These results were called by telephone at the time of interpretation on 08/30/2019 at 11:30 am to provider [facility] , who verbally acknowledged these results. IMPRESSION: 1. Multiple peripherally enhancing parenchymal lesions which are predominantly juxtacortical/subcortical in location with surrounding edema, new as compared to MRI 05/25/2019. A dominant 2 cm lesion within the anterior left frontal lobe has prominent surrounding edema with associated mass effect and resultant early rightward subfalcine herniation. 2. New 0.6 cm peripherally enhancing lesion with surrounding edema within the left thalamus also involving portions of the posterior limb of left internal capsule and extending inferiorly toward the midbrain. 3. Multiple additional new juxtacortical/subcortical T2 hyperintense lesions, some of which demonstrate associated curvilinear enhancement. 4. The constellation of findings is nonspecific and favored differential considerations include tumefactive demyelination, atypical infection or metastatic disease. Electronically Signed: By: Kellie Simmering On: 08/30/2019 11:31   Mr Cervical Spine W Or Wo Contrast  Result Date: 08/31/2019 CLINICAL DATA:  Multiple sclerosis.  Abnormal MRI of the brain. EXAM: MRI CERVICAL SPINE WITHOUT AND WITH CONTRAST TECHNIQUE: Multiplanar and multiecho pulse sequences of the cervical spine, to include the craniocervical junction and cervicothoracic junction, were obtained without and with intravenous contrast. CONTRAST:  64mL GADAVIST GADOBUTROL 1 MMOL/ML IV SOLN COMPARISON:  None. FINDINGS: Alignment: No significant listhesis or curvature is present. Vertebrae: Vertebral body heights and marrow signal are normal. Cord: Normal signal is present the cervical and upper thoracic spinal cord to the lowest imaged level, T2-3. Posterior Fossa, vertebral arteries, paraspinal tissues: Craniocervical junction is  normal. Disc levels: No significant focal disc protrusion or stenosis is present. Central canal and foramina are patent throughout the cervical spine. IMPRESSION: 1. Normal MRI appearance of the cervical spine without and with contrast. 2. Normal appearance the spinal cord. No mass lesion or evidence for demyelination. 3. No evidence for metastatic disease. Electronically Signed   By: San Morelle M.D.   On: 08/31/2019 11:51   Ct Abdomen Pelvis W Contrast  Result Date: 08/30/2019 CLINICAL DATA:  Brain metastasis? Follow-up for lesions of brain seen on MRI this morning. Numbness in face and head. EXAM: CT CHEST, ABDOMEN, AND PELVIS WITH CONTRAST TECHNIQUE: Multidetector CT imaging of the chest, abdomen and pelvis was performed following the standard protocol during bolus administration of intravenous contrast. CONTRAST:  126mL OMNIPAQUE IOHEXOL 300 MG/ML  SOLN COMPARISON:  None. FINDINGS: CT CHEST FINDINGS Cardiovascular: Heart size is normal. No pericardial effusion. No thoracic aortic aneurysm. Mediastinum/Nodes: Single borderline pathologic by size criteria lymph node in the anterior mediastinum, with short axis measurement of 11 mm. No definitively enlarged or morphologically abnormal lymph nodes identified within the mediastinum or perihilar regions. Esophagus appears normal. Trachea and central bronchi are  unremarkable. Lungs/Pleura: Lungs are clear. No pleural effusions. Musculoskeletal: No acute or suspicious osseous finding within the chest. Numerous small and mildly prominent lymph nodes in the bilateral axillary regions, including several mildly prominent retropectoral lymph nodes bilaterally, suspicious by number CT ABDOMEN PELVIS FINDINGS Hepatobiliary: No focal liver abnormality is seen. No gallstones, gallbladder wall thickening, or biliary dilatation. Pancreas: Unremarkable. No pancreatic ductal dilatation or surrounding inflammatory changes. Spleen: Normal in size without focal  abnormality. Adrenals/Urinary Tract: Adrenal glands appear normal. Kidneys are unremarkable without mass, stone or hydronephrosis. Bladder is unremarkable. Stomach/Bowel: Stomach is within normal limits. Appendix appears normal. No evidence of bowel wall thickening, distention, or inflammatory changes. Appendix is normal. Stomach is unremarkable, partially decompressed. Vascular/Lymphatic: No vascular abnormality identified. Scattered small and mildly prominent lymph nodes within the retroperitoneum and iliac chain regions bilaterally, and within the inguinal regions bilaterally, none of which are definitively pathologic by CT size criteria. Reproductive: Bilateral tubal ligation clips. Other: No free fluid or abscess collection. No soft tissue mass identified within the abdomen or pelvis. Nodular thickening deep to the umbilicus, presumably postsurgical change related to the tubal ligation. Musculoskeletal: No acute or suspicious osseous finding. IMPRESSION: 1. No acute findings within the abdomen or pelvis. No evidence of primary malignancy or metastatic disease within the chest, abdomen or pelvis. 2. Single borderline pathologic by size criteria lymph node in the anterior mediastinum, with short axis measurement of 11 mm. No definitively enlarged or morphologically abnormal lymph nodes identified within the mediastinum or perihilar regions. 3. Numerous small and mildly prominent lymph nodes within the bilateral axillary regions, including several mildly prominent retropectoral lymph nodes bilaterally, none of which are definitively pathologic by CT size criteria but these are suspicious by number. 4. Scattered small and mildly prominent lymph nodes within the retroperitoneum and iliac chain regions bilaterally, and within the inguinal regions bilaterally, again none of which are definitively pathologic by CT size criteria. 5. Nodular thickening deep to the umbilicus, presumably postsurgical change related to the  tubal ligation. Recommend correlation with surgical history. Electronically Signed   By: Bary Richard M.D.   On: 08/30/2019 13:36     CBC Recent Labs  Lab 08/30/19 0834 08/30/19 1524 08/31/19 0415  WBC 4.2 5.6 4.1  HGB 12.9 15.4 12.7  HCT 38.0 46.0 37.7  PLT 262 277 266  MCV 77.2* 79 77.3*  MCH 26.2 26.3* 26.0  MCHC 33.9 33.5 33.7  RDW 14.5 14.6 14.5  LYMPHSABS 1.9 2.2  --   MONOABS 0.5  --   --   EOSABS 0.3 0.2  --   BASOSABS 0.0 0.0  --     Chemistries  Recent Labs  Lab 08/30/19 0834 08/31/19 0415  NA 134* 130*  K 3.3* 3.4*  CL 99 97*  CO2 25 26  GLUCOSE 97 95  BUN 12 10  CREATININE 0.50 0.55  CALCIUM 8.9 8.8*  MG  --  1.9   ------------------------------------------------------------------------------------------------------------------ estimated creatinine clearance is 135.5 mL/min (by C-G formula based on SCr of 0.55 mg/dL). ------------------------------------------------------------------------------------------------------------------ No results for input(s): HGBA1C in the last 72 hours. ------------------------------------------------------------------------------------------------------------------ No results for input(s): CHOL, HDL, LDLCALC, TRIG, CHOLHDL, LDLDIRECT in the last 72 hours. ------------------------------------------------------------------------------------------------------------------ No results for input(s): TSH, T4TOTAL, T3FREE, THYROIDAB in the last 72 hours.  Invalid input(s): FREET3 ------------------------------------------------------------------------------------------------------------------ No results for input(s): VITAMINB12, FOLATE, FERRITIN, TIBC, IRON, RETICCTPCT in the last 72 hours.  Coagulation profile No results for input(s): INR, PROTIME in the last 168 hours.  No results for input(s): DDIMER  in the last 72 hours.  Cardiac Enzymes No results for input(s): CKMB, TROPONINI, MYOGLOBIN in the last 168  hours.  Invalid input(s): CK ------------------------------------------------------------------------------------------------------------------ Invalid input(s): POCBNP    Assessment & Plan  *Right facial numbness with MRI changes raising concern for multiple sclerosis or complication from HIV.  MRI of the cervical spine is negative  Patient has been seen by infectious disease Other recommendation per neuro and infectious disease I asked neurology about LP they state that because of the lesions they do not recommend LP   * CT scan of the chest and abdomen/pelvis showed diffuse lymphadenopathy.  Likely due to HIV Neurology has consulted hematology  *HIV.  Never treated.  Will get viral load and CD4 count.  Wait for further input from infectious disease.  *Hypokalemia replace potassium       Code Status Orders  (From admission, onward)         Start     Ordered   08/30/19 1351  Full code  Continuous     08/30/19 1351        Code Status History    This patient has a current code status but no historical code status.   Advance Care Planning Activity           Consults neurology hematology   DVT Prophylaxis  Lovenox  Lab Results  Component Value Date   PLT 266 08/31/2019     Time Spent in minutes 35 minutes  Greater than 50% of time spent in care coordination and counseling patient regarding the condition and plan of care.   Auburn Bilberry M.D on 08/31/2019 at 2:17 PM  Between 7am to 6pm - Pager - [phone]  After 6pm go to [email or web address] - Social research officer, government  [employer]   Office  [phone]

## 2019-08-31 NOTE — Progress Notes (Signed)
10/06 Events overnight noted. No major neuro changes overnight. Patient states that tingling/numbness of right face is getting better as well as her taste.  CT chest/abdome/pelvis:  No acute findings within the abdomen or pelvis. No evidence of primary malignancy or metastatic disease within the chest, abdomen or pelvis.Single borderline pathologic by size criteria lymph node in the anterior mediastinum, with short axis measurement of 11 mm. No definitively enlarged or morphologically abnormal lymph nodes identified within the mediastinum or perihilar regions.Numerous small and mildly prominent lymph nodes within thebilateral axillary regions, including several mildly prominentretropectoral lymph nodes bilaterally, none of which are definitively pathologic by CT size criteria but these are suspicious by number.Scattered small and mildly prominent lymph nodes within the retroperitoneum and iliac chain regions bilaterally, and within the inguinal regions bilaterally, again none of which are definitively pathologic by CT size criteria.Nodular thickening deep to the umbilicus, presumably postsurgicalchange related to the tubal ligation. Recommend correlation withsurgical history.  Labs per EMR  Neuro exam remains unchanged and stable Alert, awake, oriented x4, speech is nle, follows commands PERLA, EOMI, no nystagmus, VFF, right naso labial effacement, decrease sensation to pinprick and touch on the right face, uvula/tongue midline No motor deficit appreciated No sensory deficit appreciated No coordination deficit appreciated DTR and gait not cheked at time of exam  Recs:  - Continue neuro protective measures while admitted including normothermia, normoglycemia, correct electrolytes/metabolic abnliites, treat infection - I spoke with heme onc regarding findings of CT Abdomen/chest/pelvis. They will see her today. Appreciate their input - Appreciate ID consult - neurosurgery consult if  biopsy indicated - for now hold on steroids until exploration done or worsenig     10/05: Admission day  HPI: Penny Young is an 33 y.o. female  Being admitted with right face numbness/tingling  She is a 33 y/o with h/o of HTN who presents to ER with right face numbness,. Patient states that she started having numbness in her lower, that spread to he right face. She also complains of tingling and lost of taste. She denies HA, visual disturbances/eye pain, denies nausea/vting, denies fever/cough, denies night sweat/weight loss. She denies any weakness. She admits for smoking a pack/week for "long time". She also states that 18 years ago she was tested positive for HIV and was not started on treatment b/c low viral load. She has a twin sister that has MS. There is no worsening of symptomatology. MRI obtained in ER: Multiple peripherally enhancing parenchymal lesions which arepredominantly juxtacortical/subcortical in location with surrounding edema, new as compared to MRI 05/25/2019. A dominant 2 cm lesion within the anterior left frontal lobe has prominent surrounding edema with associated mass effect and resultant early rightward subfalcine herniation. New 0.6 cm peripherally enhancing lesion with surrounding edema within the left thalamus also involving portions of the posterior limb of left internal capsule and extending inferiorly toward the midbrain.Multiple additional new juxtacortical/subcortical T2 hyperintense lesions, some of which demonstrate associated curvilinear enhancement. The constellation of findings is nonspecific and favoreddifferential considerations include tumefactive demyelination, atypical infection or metastatic disease.  Previous MRI on 6/30: Scattered small subcortical white matter hyperintensities Bilaterally for what seems to be a right arm numbness with difficulty holding objects ( notes on 6/30).   Past Medical History:  Diagnosis Date  . Asthma   . Bipolar 1  disorder (HCC)   . Drug abuse (HCC)   . Hepatitis B   . HIV (human immunodeficiency virus infection) (HCC)   . Hypertension   . Obese   .  Restless leg syndrome   . Schizophrenia [facility])     Past Surgical History:  Procedure Laterality Date  . HERNIA REPAIR    . TUBAL LIGATION    . TYMPANOSTOMY TUBE PLACEMENT      Family History  Problem Relation Age of Onset  . Hypertension Other     Social History:  reports that she has been smoking cigarettes. She has been smoking about 0.10 packs per day. She has never used smokeless tobacco. She reports current alcohol use. She reports previous drug use. Drug: Marijuana.  Allergies  Allergen Reactions  . Aspirin Itching    Medications: I have reviewed the patient's current medications.  ROS: As per HPI Physical Examination: Blood pressure 134/86, pulse 97, temperature 99.2 F (37.3 C), temperature source Oral, resp. rate 16, height  (1.702 m), weight 122 kg, last menstrual period 08/30/2019, SpO2 100 %.  Neurologic Examination Alert, awake, oriented x4, speech is nle, follows commands PERLA, EOMI, no nystagmus, VFF, right naso labial effacement, decrease sensation to pinprick and touch on the right face, uvula/tongue midline No motor deficit appreciated No sensory deficit appreciated No coordination deficit appreciated DTR and gait not cheked at time of exam   Results for orders placed or performed during the hospital encounter of 08/30/19 (from the past 48 hour(s))  Urinalysis, Complete w Microscopic     Status: Abnormal   Collection Time: 08/30/19  8:34 AM  Result Value Ref Range   Color, Urine YELLOW (A) YELLOW   APPearance HAZY (A) CLEAR   Specific Gravity, Urine 1.023 1.005 - 1.030   pH 6.0 5.0 - 8.0   Glucose, UA NEGATIVE NEGATIVE mg/dL   Hgb urine dipstick LARGE (A) NEGATIVE   Bilirubin Urine NEGATIVE NEGATIVE   Ketones, ur NEGATIVE NEGATIVE mg/dL   Protein, ur NEGATIVE NEGATIVE mg/dL   Nitrite NEGATIVE  NEGATIVE   Leukocytes,Ua NEGATIVE NEGATIVE   RBC / HPF >50 (H) 0 - 5 RBC/hpf   WBC, UA 0-5 0 - 5 WBC/hpf   Bacteria, UA RARE (A) NONE SEEN   Squamous Epithelial / LPF 0-5 0 - 5   Mucus PRESENT     Comment: Performed at [facility], [address] [place], [place] [address]  CBC with Differential     Status: Abnormal   Collection Time: 08/30/19  8:34 AM  Result Value Ref Range   WBC 4.2 4.0 - 10.5 K/uL   RBC 4.92 3.87 - 5.11 MIL/uL   Hemoglobin 12.9 12.0 - 15.0 g/dL   HCT 60.4 54.0 - 98.1 %   MCV 77.2 (L) 80.0 - 100.0 fL   MCH 26.2 26.0 - 34.0 pg   MCHC 33.9 30.0 - 36.0 g/dL   RDW 19.1 47.8 - 29.5 %   Platelets 262 150 - 400 K/uL   nRBC 0.0 0.0 - 0.2 %   Neutrophils Relative % 36 %   Neutro Abs 1.5 (L) 1.7 - 7.7 K/uL   Lymphocytes Relative 44 %   Lymphs Abs 1.9 0.7 - 4.0 K/uL   Monocytes Relative 11 %   Monocytes Absolute 0.5 0.1 - 1.0 K/uL   Eosinophils Relative 7 %   Eosinophils Absolute 0.3 0.0 - 0.5 K/uL   Basophils Relative 1 %   Basophils Absolute 0.0 0.0 - 0.1 K/uL   Immature Granulocytes 1 %   Abs Immature Granulocytes 0.02 0.00 - 0.07 K/uL    Comment: Performed at [facility], [address] [place], [place] [address]  Basic metabolic panel  Status: Abnormal   Collection Time: 08/30/19  8:34 AM  Result Value Ref Range   Sodium 134 (L) 135 - 145 mmol/L   Potassium 3.3 (L) 3.5 - 5.1 mmol/L   Chloride 99 98 - 111 mmol/L   CO2 25 22 - 32 mmol/L   Glucose, Bld 97 70 - 99 mg/dL   BUN 12 6 - 20 mg/dL   Creatinine, Ser 1.61 0.44 - 1.00 mg/dL   Calcium 8.9 8.9 - 09.6 mg/dL   GFR calc non Af Amer >60 >60 mL/min   GFR calc Af Amer >60 >60 mL/min   Anion gap 10 5 - 15    Comment: Performed at [facility], [address] [place], [place] [address]  Pregnancy, urine POC     Status: None   Collection Time: 08/30/19  8:39 AM  Result Value Ref Range   Preg Test, Ur NEGATIVE NEGATIVE    Comment:        THE SENSITIVITY OF  THIS METHODOLOGY IS >24 mIU/mL   SARS CORONAVIRUS 2 (TAT 6-24 HRS) Nasopharyngeal Nasopharyngeal Swab     Status: None   Collection Time: 08/30/19  1:32 PM   Specimen: Nasopharyngeal Swab  Result Value Ref Range   SARS Coronavirus 2 NEGATIVE NEGATIVE    Comment: (NOTE) SARS-CoV-2 target nucleic acids are NOT DETECTED. The SARS-CoV-2 RNA is generally detectable in upper and lower respiratory specimens during the acute phase of infection. Negative results do not preclude SARS-CoV-2 infection, do not rule out co-infections with other pathogens, and should not be used as the sole basis for treatment or other patient management decisions. Negative results must be combined with clinical observations, patient history, and epidemiological information. The expected result is Negative. Fact Sheet for Patients: HairSlick.no Fact Sheet for Healthcare Providers: quierodirigir.com This test is not yet approved or cleared by the [place] FDA and  has been authorized for detection and/or diagnosis of SARS-CoV-2 by FDA under an Emergency Use Authorization (EUA). This EUA will remain  in effect (meaning this test can be used) for the duration of the COVID-19 declaration under Section 56 4(b)(1) of the Act, 21 U.S.C. section 360bbb-3(b)(1), unless the authorization is terminated or revoked sooner. Performed at [facility], [address], [place], [place] [address]   Cryptococcal antigen     Status: None   Collection Time: 08/30/19  6:07 PM  Result Value Ref Range   Crypto Ag NEGATIVE NEGATIVE   Cryptococcal Ag Titer NOT INDICATED NOT INDICATED    Comment: Performed at [facility], [address], [place], [place] [address]  Basic metabolic panel     Status: Abnormal   Collection Time: 08/31/19  4:15 AM  Result Value Ref Range   Sodium 130 (L) 135 - 145 mmol/L   Potassium 3.4 (L) 3.5 - 5.1 mmol/L   Chloride 97 (L) 98 -  111 mmol/L   CO2 26 22 - 32 mmol/L   Glucose, Bld 95 70 - 99 mg/dL   BUN 10 6 - 20 mg/dL   Creatinine, Ser 9.56 0.44 - 1.00 mg/dL   Calcium 8.8 (L) 8.9 - 10.3 mg/dL   GFR calc non Af Amer >60 >60 mL/min   GFR calc Af Amer >60 >60 mL/min   Anion gap 7 5 - 15    Comment: Performed at [facility], [address], [place], [place] [address]  CBC     Status: Abnormal   Collection Time: 08/31/19  4:15 AM  Result Value Ref Range   WBC 4.1 4.0 - 10.5 K/uL   RBC 4.88 3.87 - 5.11 MIL/uL   Hemoglobin 12.7 12.0 - 15.0 g/dL   HCT 45.437.7 09.836.0 - 11.946.0 %   MCV 77.3 (L) 80.0 - 100.0 fL   MCH 26.0 26.0 - 34.0 pg   MCHC 33.7 30.0 - 36.0 g/dL   RDW 14.714.5 82.911.5 - 56.215.5 %   Platelets 266 150 - 400 K/uL   nRBC 0.0 0.0 - 0.2 %    Comment: Performed at [facility], [address] [place], [place] [address]    Recent Results (from the past 240 hour(s))  SARS CORONAVIRUS 2 (TAT 6-24 HRS) Nasopharyngeal Nasopharyngeal Swab     Status: None   Collection Time: 08/30/19  1:32 PM   Specimen: Nasopharyngeal Swab  Result Value Ref Range Status   SARS Coronavirus 2 NEGATIVE NEGATIVE Final    Comment: (NOTE) SARS-CoV-2 target nucleic acids are NOT DETECTED. The SARS-CoV-2 RNA is generally detectable in upper and lower respiratory specimens during the acute phase of infection. Negative results do not preclude SARS-CoV-2 infection, do not rule out co-infections with other pathogens, and should not be used as the sole basis for treatment or other patient management decisions. Negative results must be combined with clinical observations, patient history, and epidemiological information. The expected result is Negative. Fact Sheet for Patients: HairSlick.nohttps://www.fda.gov/media/138098/download Fact Sheet for Healthcare Providers: quierodirigir.comhttps://www.fda.gov/media/138095/download This test is not yet approved or cleared by the [place] FDA and  has been authorized for detection and/or diagnosis of  SARS-CoV-2 by FDA under an Emergency Use Authorization (EUA). This EUA will remain  in effect (meaning this test can be used) for the duration of the COVID-19 declaration under Section 56 4(b)(1) of the Act, 21 U.S.C. section 360bbb-3(b)(1), unless the authorization is terminated or revoked sooner. Performed at [facility], [address] [place], [place] [address]     Ct Chest W Contrast  Result Date: 08/30/2019 CLINICAL DATA:  Brain metastasis? Follow-up for lesions of brain seen on MRI this morning. Numbness in face and head. EXAM: CT CHEST, ABDOMEN, AND PELVIS WITH CONTRAST TECHNIQUE: Multidetector CT imaging of the chest, abdomen and pelvis was performed following the standard protocol during bolus administration of intravenous contrast. CONTRAST:  100mL OMNIPAQUE IOHEXOL 300 MG/ML  SOLN COMPARISON:  None. FINDINGS: CT CHEST FINDINGS Cardiovascular: Heart size is normal. No pericardial effusion. No thoracic aortic aneurysm. Mediastinum/Nodes: Single borderline pathologic by size criteria lymph node in the anterior mediastinum, with short axis measurement of 11 mm. No definitively enlarged or morphologically abnormal lymph nodes identified within the mediastinum or perihilar regions. Esophagus appears normal. Trachea and central bronchi are unremarkable. Lungs/Pleura: Lungs are clear. No pleural effusions. Musculoskeletal: No acute or suspicious osseous finding within the chest. Numerous small and mildly prominent lymph nodes in the bilateral axillary regions, including several mildly prominent retropectoral lymph nodes bilaterally, suspicious by number CT ABDOMEN PELVIS FINDINGS Hepatobiliary: No focal liver abnormality is seen. No gallstones, gallbladder wall thickening, or biliary dilatation. Pancreas: Unremarkable. No pancreatic ductal dilatation or surrounding inflammatory changes. Spleen: Normal in size without focal abnormality. Adrenals/Urinary Tract: Adrenal glands appear normal.  Kidneys are unremarkable without mass, stone or hydronephrosis. Bladder is unremarkable. Stomach/Bowel: Stomach is within normal limits. Appendix appears normal. No evidence of bowel wall thickening, distention, or inflammatory changes. Appendix is normal. Stomach is unremarkable, partially decompressed. Vascular/Lymphatic: No vascular abnormality identified. Scattered small and mildly prominent lymph nodes within the retroperitoneum and  iliac chain regions bilaterally, and within the inguinal regions bilaterally, none of which are definitively pathologic by CT size criteria. Reproductive: Bilateral tubal ligation clips. Other: No free fluid or abscess collection. No soft tissue mass identified within the abdomen or pelvis. Nodular thickening deep to the umbilicus, presumably postsurgical change related to the tubal ligation. Musculoskeletal: No acute or suspicious osseous finding. IMPRESSION: 1. No acute findings within the abdomen or pelvis. No evidence of primary malignancy or metastatic disease within the chest, abdomen or pelvis. 2. Single borderline pathologic by size criteria lymph node in the anterior mediastinum, with short axis measurement of 11 mm. No definitively enlarged or morphologically abnormal lymph nodes identified within the mediastinum or perihilar regions. 3. Numerous small and mildly prominent lymph nodes within the bilateral axillary regions, including several mildly prominent retropectoral lymph nodes bilaterally, none of which are definitively pathologic by CT size criteria but these are suspicious by number. 4. Scattered small and mildly prominent lymph nodes within the retroperitoneum and iliac chain regions bilaterally, and within the inguinal regions bilaterally, again none of which are definitively pathologic by CT size criteria. 5. Nodular thickening deep to the umbilicus, presumably postsurgical change related to the tubal ligation. Recommend correlation with surgical history.  Electronically Signed   By: Franki Cabot M.D.   On: 08/30/2019 13:36   Mr Jeri Cos And Wo Contrast  Addendum Date: 08/30/2019   ADDENDUM REPORT: 08/30/2019 11:55 ADDENDUM: In addition to atypical infection (i.e. Toxoplasmosis), tumefactive demyelination and metastatic disease, CNS lymphoma is also a differential consideration. Correlate with clinically and with CD4 count. Addendum called by telephone at the time of interpretation on 08/30/2019 at 11:54 am to provider [facility] , who verbally acknowledged these results. Electronically Signed   By: Kellie Simmering   On: 08/30/2019 11:55   Result Date: 08/30/2019 CLINICAL DATA:  Focal neuro deficit, greater than 6 hours, stroke suspected. Additional history provided: Patient reports right-sided throat, lip and tongue numbness since 08/20/2019, loss of taste. EXAM: MRI HEAD WITHOUT AND WITH CONTRAST TECHNIQUE: Multiplanar, multiecho pulse sequences of the brain and surrounding structures were obtained without and with intravenous contrast. CONTRAST:  1mL GADAVIST GADOBUTROL 1 MMOL/ML IV SOLN COMPARISON:  Brain MRI 05/25/2019 FINDINGS: Brain: There are multiple intracranial enhancing lesions which are new as compared to prior MRI 05/25/2019. Within the subcortical white matter of the anterior left frontal lobe, there is a 2.0 x 1.6 x 1.7 cm (AP x TV x CC) peripherally enhancing lesion with prominent surrounding edema. Resultant mass effect with rightward bowing of the anterior falx and early rightward subfalcine herniation. More posteriorly within the lateral left frontal lobe, there is a 0.4 cm peripherally enhancing juxtacortical lesion with a small amount of surrounding edema. 3 mm peripherally enhancing juxtacortical lesion within the left frontal operculum with a small amount of surrounding edema. (Series 20, image 16). 1.5 x 0.7 cm focus of subcortical/juxtacrotical T2/FLAIR hyperintensity within the left parietooccipital lobe with adjacent curvilinear  enhancement (series 15, image 30). 1.6 x 1.9 cm focus of T2/FLAIR hyperintensity within the left thalamus also involving portions of the posterior limb of left internal capsule and extending inferiorly toward the midbrain. Centrally within this lesion there is a peripherally enhancing focus measuring 0.6 cm (series 15, image 27) (series 19, image 86). In addition to the white matter disease demonstrated on prior brain MRI, there are several additional new small foci of predominantly subcortical/juxtacortical T2/FLAIR hyperintensity, some of which demonstrate associated curvilinear enhancement (for instance a lesion within  the anterior right frontal lobe (series 15, image 35) (series 19, images 104-107). No definite intracranial blood products. Curvilinear SWI signal loss in the region of several of these lesions appears to reflect dilated venous vasculature. No evidence of acute infarct. No midline shift at the level of the septum pellucidum. Incidentally noted cavum septum pellucidum and cavum vergae. Vascular: Flow voids maintained within the proximal large arterial vessels. Skull and upper cervical spine: No focal marrow lesion. Sinuses/Orbits: Visualized orbits demonstrate no acute abnormality. Mucous retention cyst within a posterior left ethmoid air cell. Frothy secretions within the bilateral sphenoid sinuses. Small bilateral mastoid effusions These results were called by telephone at the time of interpretation on 08/30/2019 at 11:30 am to provider [place] [facility] , who verbally acknowledged these results. IMPRESSION: 1. Multiple peripherally enhancing parenchymal lesions which are predominantly juxtacortical/subcortical in location with surrounding edema, new as compared to MRI 05/25/2019. A dominant 2 cm lesion within the anterior left frontal lobe has prominent surrounding edema with associated mass effect and resultant early rightward subfalcine herniation. 2. New 0.6 cm peripherally enhancing lesion with  surrounding edema within the left thalamus also involving portions of the posterior limb of left internal capsule and extending inferiorly toward the midbrain. 3. Multiple additional new juxtacortical/subcortical T2 hyperintense lesions, some of which demonstrate associated curvilinear enhancement. 4. The constellation of findings is nonspecific and favored differential considerations include tumefactive demyelination, atypical infection or metastatic disease. Electronically Signed: By: Jackey Loge On: 08/30/2019 11:31   Ct Abdomen Pelvis W Contrast  Result Date: 08/30/2019 CLINICAL DATA:  Brain metastasis? Follow-up for lesions of brain seen on MRI this morning. Numbness in face and head. EXAM: CT CHEST, ABDOMEN, AND PELVIS WITH CONTRAST TECHNIQUE: Multidetector CT imaging of the chest, abdomen and pelvis was performed following the standard protocol during bolus administration of intravenous contrast. CONTRAST:  OMNIPAQUE IOHEXOL 300 MG/ML  SOLN COMPARISON:  None. FINDINGS: CT CHEST FINDINGS Cardiovascular: Heart size is normal. No pericardial effusion. No thoracic aortic aneurysm. Mediastinum/Nodes: Single borderline pathologic by size criteria lymph node in the anterior mediastinum, with short axis measurement of 11 mm. No definitively enlarged or morphologically abnormal lymph nodes identified within the mediastinum or perihilar regions. Esophagus appears normal. Trachea and central bronchi are unremarkable. Lungs/Pleura: Lungs are clear. No pleural effusions. Musculoskeletal: No acute or suspicious osseous finding within the chest. Numerous small and mildly prominent lymph nodes in the bilateral axillary regions, including several mildly prominent retropectoral lymph nodes bilaterally, suspicious by number CT ABDOMEN PELVIS FINDINGS Hepatobiliary: No focal liver abnormality is seen. No gallstones, gallbladder wall thickening, or biliary dilatation. Pancreas: Unremarkable. No pancreatic ductal  dilatation or surrounding inflammatory changes. Spleen: Normal in size without focal abnormality. Adrenals/Urinary Tract: Adrenal glands appear normal. Kidneys are unremarkable without mass, stone or hydronephrosis. Bladder is unremarkable. Stomach/Bowel: Stomach is within normal limits. Appendix appears normal. No evidence of bowel wall thickening, distention, or inflammatory changes. Appendix is normal. Stomach is unremarkable, partially decompressed. Vascular/Lymphatic: No vascular abnormality identified. Scattered small and mildly prominent lymph nodes within the retroperitoneum and iliac chain regions bilaterally, and within the inguinal regions bilaterally, none of which are definitively pathologic by CT size criteria. Reproductive: Bilateral tubal ligation clips. Other: No free fluid or abscess collection. No soft tissue mass identified within the abdomen or pelvis. Nodular thickening deep to the umbilicus, presumably postsurgical change related to the tubal ligation. Musculoskeletal: No acute or suspicious osseous finding. IMPRESSION: 1. No acute findings within the abdomen or pelvis. No evidence of  primary malignancy or metastatic disease within the chest, abdomen or pelvis. 2. Single borderline pathologic by size criteria lymph node in the anterior mediastinum, with short axis measurement of 11 mm. No definitively enlarged or morphologically abnormal lymph nodes identified within the mediastinum or perihilar regions. 3. Numerous small and mildly prominent lymph nodes within the bilateral axillary regions, including several mildly prominent retropectoral lymph nodes bilaterally, none of which are definitively pathologic by CT size criteria but these are suspicious by number. 4. Scattered small and mildly prominent lymph nodes within the retroperitoneum and iliac chain regions bilaterally, and within the inguinal regions bilaterally, again none of which are definitively pathologic by CT size criteria. 5.  Nodular thickening deep to the umbilicus, presumably postsurgical change related to the tubal ligation. Recommend correlation with surgical history. Electronically Signed   By: Bary RichardStan  Maynard M.D.   On: 08/30/2019 13:36     Assessment/Plan: She is a 33 y/o with h/o of HTN who presents to ER with right face numbness/tingling for a week duration. Neuro exam with right facial/decrease sensation to pinprick and touch on the right face. MRI concerning for atypical infection vs mets vs MD vs primary brain lesion ( lymphoma??)  RECS: - Neuro protective measures including normothermia, normoglycemia, correct electrolytes/metabolic abnliites, treat infection - Obtain MRI w/wo of cervical spine - Obtain CT chest abdomen and pelvis wwo contrast - HIV panel - Consider ID consult - Consider neurosurgery consult if biopsy indicated - for now hold on steroids until exploration done or worsenig 08/31/2019, 10:26 AM

## 2019-08-31 NOTE — Clinical Social Work Note (Addendum)
CSW acknowledges consult for medication needs. CSW will follow to determine if she will discharge on anything new. Advice worker for [employer]. 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 Her other child is 30 years old.  Dayton Scrape, [place] [phone]  9:23 am Response from financial counselor: "Penny Young's application was submitted to [facility] for [facility].  Her assigned Medicaid ID# is [id number]. The application is still in process." Application was submitted 9/25. Patient was receiving Family Planning Benefits while she was a resident in [place].  Dayton Scrape, [place]

## 2019-08-31 NOTE — Consult Note (Signed)
[facility]  Date of admission:  08/30/2019  Inpatient day:  08/31/2019  Consulting physician: Dr Auburn BilberryShreyang Patel  Reason for Consultation:  Abnormal CT scan  Chief Complaint: Penny Young is a 33 y.o. female with HIV who was admitted through the emergency room with right facial numbness and an abnormal head MRI.  HPI:  The patient was diagnosed with HIV years ago.  With the birth of her second child on 10/23/2008, she was on Kaletra and Combivir.  After delivery, antiretrovirals were discontinued as her viral load was low and CD4 count was 1360.  Last CD4 count was 1580 on 06/30/2019.  She has not been on any subsequent antivirals.  She was seen in the [facility] ER on 05/25/2019 with right arm numbness.  Arm numbness progressed from distal to proximal.  Exam revealed no focal neurologic deficits.  Head MRI with and without contrast revealed no acute abnormality.  There were small subcortical white matter hyperintensities bilaterally.  Differential included chronic ischemia, vasculitis, complex migraine headache, and demyelinating disease.  Labs included a hematocrit of 37.6, hemoglobin 12.5, MCV 78.7, platelets 239,000, white count 3900 with an ANC of 900.  Recommendation was for follow-up with infectious disease, neurology, and primary care.  She did not follow-up secondary to insurance issues.  The patient presented to [facility] ER on 08/30/2019 with a week history of right sided facial numbness, right tongue numbness, and decreased taste sensation.  Labs included a hematocrit of 38.0, hemoglobin 12.9, MCV 77.2, platelets 262,000, white count 4200 with an ANC of 1500.  Creatinine was 0.5.  Urine pregnancy test was negative.  Head MRI revealed on 08/30/2019 revealed multiple peripherally enhancing parenchymal lesions predominantly juxtacortical/subcortical in location with surrounding edema.  There is a dominant 2 cm lesion within the anterior left frontal lobe with prominent  surrounding edema with associated mass effect and resultant early rightward subfalcine herniation.  There was a new 0.6 cm peripherally enhancing lesion with surrounding edema within the left thalamus also involving portions of the posterior limb of left internal capsule and extending inferiorly toward the midbrain.  There were multiple additional new juxtacortical/subcortical T2 hyperintense lesions, some of which demonstrate associated curvilinear enhancement.  The constellation of findings was nonspecific and favored differential considerations include tumefactive demyelination, atypical infection (toxoplasmosis), metastatic disease, or CNS lymphoma.  Chest, abdomen, and pelvic CT on 08/30/2019 revealed no evidence of primary malignancy or metastatic disease.  There is a single borderline pathologic (1.1 cm) lymph node in the anterior mediastinum.   There is no definitively enlarged or morphologically abnormal lymph nodes within the mediastinum or perihilar regions.  There are numerous small and mildly prominent lymph nodes within the bilateral axillary regions, including several mildly prominent retropectoral lymph nodes bilaterally, none pathologic by CT size criteria.  There are scattered small and mildly prominent lymph nodes within the retroperitoneum, iliac chain regions bilaterally, and  inguinal regions bilaterally, none pathologic by CT size criteria.  Cervical spine MRI on 08/31/2019 was normal.  Symptomatically, she feels fine.  She notes the right facial numbness and loss of taste have resolved.  She denies any headache, change in vision, focal numbness or weakness, balance or coordination issues.  She has a history of hepatitis B, bipolar disease and schizophrenia.  She has an identical twin with multiple sclerosis.   Past Medical History:  Diagnosis Date  . Asthma   . Bipolar 1 disorder (HCC)   . Drug abuse (HCC)   . Hepatitis B   .  HIV (human immunodeficiency virus infection) ([place])    . Hypertension   . Obese   . Restless leg syndrome   . Schizophrenia [facility])     Past Surgical History:  Procedure Laterality Date  . HERNIA REPAIR    . TUBAL LIGATION    . TYMPANOSTOMY TUBE PLACEMENT      Family History  Problem Relation Age of Onset  . Hypertension Other     Social History:  reports that she has been smoking cigarettes. She has been smoking about 0.10 packs per day. She has never used smokeless tobacco. She reports current alcohol use. She reports previous drug use. Drug: Marijuana.  She has 2 children.  She denies any exposure to radiation or toxins.  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  She lives with a friend in [place].  Allergies:  Allergies  Allergen Reactions  . Aspirin Itching    Medications Prior to Admission  Medication Sig Dispense Refill  . amLODipine (NORVASC) 5 MG tablet Take 1 tablet (5 mg total) by mouth daily. 30 tablet 3  . hydrochlorothiazide (HYDRODIURIL) 12.5 MG tablet Take 1 tablet (12.5 mg total) by mouth daily. 30 tablet 3    Review of Systems: GENERAL:  Feels good.  No fevers, sweats or weight loss. PERFORMANCE STATUS (ECOG):  0 HEENT:  No visual changes, runny nose, sore throat, mouth sores or tenderness. Lungs: No shortness of breath or cough.  No hemoptysis. Cardiac:  No chest pain, palpitations, orthopnea, or PND. GI:  No nausea, vomiting, diarrhea, constipation, melena or hematochezia. GU:  No urgency, frequency, dysuria, or hematuria. Musculoskeletal:  No back pain.  No joint pain.  No muscle tenderness. Extremities:  No pain or swelling. Skin:  No rashes or skin changes. Neuro:  Resolution of facial numbness and loss of taste.  No headache, focal numbness or weakness, balance or coordination issues. Endocrine:  No diabetes, thyroid issues, hot flashes or night sweats. Psych:  No mood changes, depression or anxiety. Pain:  No focal pain. Review of systems:  All other systems reviewed and found to be  negative.  Physical Exam:  Blood pressure 124/86, pulse 99, temperature 98.5 F (36.9 C), temperature source Oral, resp. rate 18, height 5\' 7"  (1.702 m), weight 268 lb 15.4 oz (122 kg), last menstrual period 08/30/2019, SpO2 99 %.  GENERAL:  Well developed, well nourished, heavyset woman sitting comfortably on the medical unit in no acute distress. MENTAL STATUS:  Alert and oriented to person, place and time. HEAD:  Brown hair with braids.  Normocephalic, atraumatic, face symmetric, no Cushingoid features. EYES:  Brown eyes.  Pupils equal round and reactive to light and accomodation.  No conjunctivitis or scleral icterus. ENT:  Oropharynx clear without lesion.  Tongue normal. Mucous membranes moist.  RESPIRATORY:  Clear to auscultation without rales, wheezes or rhonchi. CARDIOVASCULAR:  Regular rate and rhythm without murmur, rub or gallop. ABDOMEN:  Fully round.  Soft, non-tender, with active bowel sounds, and no appreciable hepatosplenomegaly.  No masses. SKIN:  No rashes, ulcers or lesions. EXTREMITIES: No edema, no skin discoloration or tenderness.  No palpable cords. LYMPH NODES: No palpable cervical, supraclavicular, axillary or inguinal adenopathy  NEUROLOGICAL: Alert & oriented, cranial nerves II-XII intact; motor strength 5/5 throughout; sensation intact; no clonus or Babinski. PSYCH:  Appropriate.   Results for orders placed or performed during the hospital encounter of 08/30/19 (from the past 48 hour(s))  Urinalysis, Complete w Microscopic     Status: Abnormal   Collection  Time: 08/30/19  8:34 AM  Result Value Ref Range   Color, Urine YELLOW (A) YELLOW   APPearance HAZY (A) CLEAR   Specific Gravity, Urine 1.023 1.005 - 1.030   pH 6.0 5.0 - 8.0   Glucose, UA NEGATIVE NEGATIVE mg/dL   Hgb urine dipstick LARGE (A) NEGATIVE   Bilirubin Urine NEGATIVE NEGATIVE   Ketones, ur NEGATIVE NEGATIVE mg/dL   Protein, ur NEGATIVE NEGATIVE mg/dL   Nitrite NEGATIVE NEGATIVE    Leukocytes,Ua NEGATIVE NEGATIVE   RBC / HPF >50 (H) 0 - 5 RBC/hpf   WBC, UA 0-5 0 - 5 WBC/hpf   Bacteria, UA RARE (A) NONE SEEN   Squamous Epithelial / LPF 0-5 0 - 5   Mucus PRESENT     Comment: Performed at [facility], [address] [place], [place] [address]  CBC with Differential     Status: Abnormal   Collection Time: 08/30/19  8:34 AM  Result Value Ref Range   WBC 4.2 4.0 - 10.5 K/uL   RBC 4.92 3.87 - 5.11 MIL/uL   Hemoglobin 12.9 12.0 - 15.0 g/dL   HCT 22.3 36.1 - 22.4 %   MCV 77.2 (L) 80.0 - 100.0 fL   MCH 26.2 26.0 - 34.0 pg   MCHC 33.9 30.0 - 36.0 g/dL   RDW 49.7 53.0 - 05.1 %   Platelets 262 150 - 400 K/uL   nRBC 0.0 0.0 - 0.2 %   Neutrophils Relative % 36 %   Neutro Abs 1.5 (L) 1.7 - 7.7 K/uL   Lymphocytes Relative 44 %   Lymphs Abs 1.9 0.7 - 4.0 K/uL   Monocytes Relative 11 %   Monocytes Absolute 0.5 0.1 - 1.0 K/uL   Eosinophils Relative 7 %   Eosinophils Absolute 0.3 0.0 - 0.5 K/uL   Basophils Relative 1 %   Basophils Absolute 0.0 0.0 - 0.1 K/uL   Immature Granulocytes 1 %   Abs Immature Granulocytes 0.02 0.00 - 0.07 K/uL    Comment: Performed at [facility], [address] [place], [place] [address]  Basic metabolic panel     Status: Abnormal   Collection Time: 08/30/19  8:34 AM  Result Value Ref Range   Sodium 134 (L) 135 - 145 mmol/L   Potassium 3.3 (L) 3.5 - 5.1 mmol/L   Chloride 99 98 - 111 mmol/L   CO2 25 22 - 32 mmol/L   Glucose, Bld 97 70 - 99 mg/dL   BUN 12 6 - 20 mg/dL   Creatinine, Ser 1.73 0.44 - 1.00 mg/dL   Calcium 8.9 8.9 - 56.7 mg/dL   GFR calc non Af Amer >60 >60 mL/min   GFR calc Af Amer >60 >60 mL/min   Anion gap 10 5 - 15    Comment: Performed at [facility], [address] [place], [place] [address]  Pregnancy, urine POC     Status: None   Collection Time: 08/30/19  8:39 AM  Result Value Ref Range   Preg Test, Ur NEGATIVE NEGATIVE    Comment:        THE SENSITIVITY OF THIS METHODOLOGY IS >24  mIU/mL   SARS CORONAVIRUS 2 (TAT 6-24 HRS) Nasopharyngeal Nasopharyngeal Swab     Status: None   Collection Time: 08/30/19  1:32 PM   Specimen: Nasopharyngeal Swab  Result Value Ref Range   SARS Coronavirus 2 NEGATIVE NEGATIVE    Comment: (NOTE) SARS-CoV-2 target nucleic acids are NOT DETECTED. The SARS-CoV-2 RNA is generally  detectable in upper and lower respiratory specimens during the acute phase of infection. Negative results do not preclude SARS-CoV-2 infection, do not rule out co-infections with other pathogens, and should not be used as the sole basis for treatment or other patient management decisions. Negative results must be combined with clinical observations, patient history, and epidemiological information. The expected result is Negative. Fact Sheet for Patients: HairSlick.nohttps://www.fda.gov/media/138098/download Fact Sheet for Healthcare Providers: quierodirigir.comhttps://www.fda.gov/media/138095/download This test is not yet approved or cleared by the [place] FDA and  has been authorized for detection and/or diagnosis of SARS-CoV-2 by FDA under an Emergency Use Authorization (EUA). This EUA will remain  in effect (meaning this test can be used) for the duration of the COVID-19 declaration under Section 56 4(b)(1) of the Act, 21 U.S.C. section 360bbb-3(b)(1), unless the authorization is terminated or revoked sooner. Performed at [facility], [address] [place], [place] [address]   Helper T-Lymph-CD4 [facility] only)     Status: Abnormal   Collection Time: 08/30/19  3:24 PM  Result Value Ref Range   Absolute CD 4 Helper 370 359 - 1,519 /uL   % CD 4 Pos. Lymph. 16.8 (L) 30.8 - 58.5 %   WBC 5.6 3.4 - 10.8 x10E3/uL   RBC 5.85 (H) 3.77 - 5.28 x10E6/uL   Hematocrit 46.0 34.0 - 46.6 %   MCV 79 79 - 97 fL   MCH 26.3 (L) 26.6 - 33.0 pg   MCHC 33.5 31.5 - 35.7 g/dL   RDW 13.014.6 86.511.7 - 78.415.4 %   Platelets 277 150 - 450 x10E3/uL   Neutrophils 46 Not Estab. %   Lymphs 39 Not Estab. %    Monocytes 10 Not Estab. %   Eos 4 Not Estab. %   Basos 1 Not Estab. %   Neutrophils Absolute 2.7 1.4 - 7.0 x10E3/uL   Lymphocytes Absolute 2.2 0.7 - 3.1 x10E3/uL   Monocytes Absolute 0.6 0.1 - 0.9 x10E3/uL   EOS (ABSOLUTE) 0.2 0.0 - 0.4 x10E3/uL   Basophils Absolute 0.0 0.0 - 0.2 x10E3/uL   Immature Granulocytes 0 Not Estab. %   Immature Grans (Abs) 0.0 0.0 - 0.1 x10E3/uL    Comment: (NOTE) Performed At: [facility] [address] [place], [place] [id number] Jolene SchimkeNagendra Sanjai MD XL:[id number]:[phone]    Hemoglobin 15.4 11.1 - 15.9 g/dL  Cryptococcal antigen     Status: None   Collection Time: 08/30/19  6:07 PM  Result Value Ref Range   Crypto Ag NEGATIVE NEGATIVE   Cryptococcal Ag Titer NOT INDICATED NOT INDICATED    Comment: Performed at [facility], [address] [place], [place] [address]  RPR     Status: None   Collection Time: 08/30/19  6:11 PM  Result Value Ref Range   RPR Ser Ql NON REACTIVE NON REACTIVE    Comment: Performed at [facility], [address] [place], [place] [address]  Basic metabolic panel     Status: Abnormal   Collection Time: 08/31/19  4:15 AM  Result Value Ref Range   Sodium 130 (L) 135 - 145 mmol/L   Potassium 3.4 (L) 3.5 - 5.1 mmol/L   Chloride 97 (L) 98 - 111 mmol/L   CO2 26 22 - 32 mmol/L   Glucose, Bld 95 70 - 99 mg/dL   BUN 10 6 - 20 mg/dL   Creatinine, Ser 2.590.55 0.44 - 1.00 mg/dL   Calcium 8.8 (L) 8.9 - 10.3 mg/dL   GFR calc non Af  Amer >60 >60 mL/min   GFR calc Af Amer >60 >60 mL/min   Anion gap 7 5 - 15    Comment: Performed at [facility], [address] [place], [place] [address]  CBC     Status: Abnormal   Collection Time: 08/31/19  4:15 AM  Result Value Ref Range   WBC 4.1 4.0 - 10.5 K/uL   RBC 4.88 3.87 - 5.11 MIL/uL   Hemoglobin 12.7 12.0 - 15.0 g/dL   HCT 81.4 48.1 - 85.6 %   MCV 77.3 (L) 80.0 - 100.0 fL   MCH 26.0 26.0 - 34.0 pg   MCHC 33.7 30.0 - 36.0 g/dL   RDW 31.4 97.0 - 26.3 %    Platelets 266 150 - 400 K/uL   nRBC 0.0 0.0 - 0.2 %    Comment: Performed at [facility], [address], [place], [place] [address]  Magnesium     Status: None   Collection Time: 08/31/19  4:15 AM  Result Value Ref Range   Magnesium 1.9 1.7 - 2.4 mg/dL    Comment: Performed at [facility], [address] [place], [place] [address]   Ct Chest W Contrast  Result Date: 08/30/2019 CLINICAL DATA:  Brain metastasis? Follow-up for lesions of brain seen on MRI this morning. Numbness in face and head. EXAM: CT CHEST, ABDOMEN, AND PELVIS WITH CONTRAST TECHNIQUE: Multidetector CT imaging of the chest, abdomen and pelvis was performed following the standard protocol during bolus administration of intravenous contrast. CONTRAST:  OMNIPAQUE IOHEXOL 300 MG/ML  SOLN COMPARISON:  None. FINDINGS: CT CHEST FINDINGS Cardiovascular: Heart size is normal. No pericardial effusion. No thoracic aortic aneurysm. Mediastinum/Nodes: Single borderline pathologic by size criteria lymph node in the anterior mediastinum, with short axis measurement of 11 mm. No definitively enlarged or morphologically abnormal lymph nodes identified within the mediastinum or perihilar regions. Esophagus appears normal. Trachea and central bronchi are unremarkable. Lungs/Pleura: Lungs are clear. No pleural effusions. Musculoskeletal: No acute or suspicious osseous finding within the chest. Numerous small and mildly prominent lymph nodes in the bilateral axillary regions, including several mildly prominent retropectoral lymph nodes bilaterally, suspicious by number CT ABDOMEN PELVIS FINDINGS Hepatobiliary: No focal liver abnormality is seen. No gallstones, gallbladder wall thickening, or biliary dilatation. Pancreas: Unremarkable. No pancreatic ductal dilatation or surrounding inflammatory changes. Spleen: Normal in size without focal abnormality. Adrenals/Urinary Tract: Adrenal glands appear normal. Kidneys are unremarkable  without mass, stone or hydronephrosis. Bladder is unremarkable. Stomach/Bowel: Stomach is within normal limits. Appendix appears normal. No evidence of bowel wall thickening, distention, or inflammatory changes. Appendix is normal. Stomach is unremarkable, partially decompressed. Vascular/Lymphatic: No vascular abnormality identified. Scattered small and mildly prominent lymph nodes within the retroperitoneum and iliac chain regions bilaterally, and within the inguinal regions bilaterally, none of which are definitively pathologic by CT size criteria. Reproductive: Bilateral tubal ligation clips. Other: No free fluid or abscess collection. No soft tissue mass identified within the abdomen or pelvis. Nodular thickening deep to the umbilicus, presumably postsurgical change related to the tubal ligation. Musculoskeletal: No acute or suspicious osseous finding. IMPRESSION: 1. No acute findings within the abdomen or pelvis. No evidence of primary malignancy or metastatic disease within the chest, abdomen or pelvis. 2. Single borderline pathologic by size criteria lymph node in the anterior mediastinum, with short axis measurement of 11 mm. No definitively enlarged or morphologically abnormal lymph nodes identified within the mediastinum or perihilar regions. 3. Numerous small and mildly prominent lymph nodes  within the bilateral axillary regions, including several mildly prominent retropectoral lymph nodes bilaterally, none of which are definitively pathologic by CT size criteria but these are suspicious by number. 4. Scattered small and mildly prominent lymph nodes within the retroperitoneum and iliac chain regions bilaterally, and within the inguinal regions bilaterally, again none of which are definitively pathologic by CT size criteria. 5. Nodular thickening deep to the umbilicus, presumably postsurgical change related to the tubal ligation. Recommend correlation with surgical history. Electronically Signed   By:  Bary Richard M.D.   On: 08/30/2019 13:36   Mr Laqueta Jean And Wo Contrast  Addendum Date: 08/30/2019   ADDENDUM REPORT: 08/30/2019 11:55 ADDENDUM: In addition to atypical infection (i.e. Toxoplasmosis), tumefactive demyelination and metastatic disease, CNS lymphoma is also a differential consideration. Correlate with clinically and with CD4 count. Addendum called by telephone at the time of interpretation on 08/30/2019 at 11:54 am to provider [facility] , who verbally acknowledged these results. Electronically Signed   By: Jackey Loge   On: 08/30/2019 11:55   Result Date: 08/30/2019 CLINICAL DATA:  Focal neuro deficit, greater than 6 hours, stroke suspected. Additional history provided: Patient reports right-sided throat, lip and tongue numbness since 08/20/2019, loss of taste. EXAM: MRI HEAD WITHOUT AND WITH CONTRAST TECHNIQUE: Multiplanar, multiecho pulse sequences of the brain and surrounding structures were obtained without and with intravenous contrast. CONTRAST:  10mL GADAVIST GADOBUTROL 1 MMOL/ML IV SOLN COMPARISON:  Brain MRI 05/25/2019 FINDINGS: Brain: There are multiple intracranial enhancing lesions which are new as compared to prior MRI 05/25/2019. Within the subcortical white matter of the anterior left frontal lobe, there is a 2.0 x 1.6 x 1.7 cm (AP x TV x CC) peripherally enhancing lesion with prominent surrounding edema. Resultant mass effect with rightward bowing of the anterior falx and early rightward subfalcine herniation. More posteriorly within the lateral left frontal lobe, there is a 0.4 cm peripherally enhancing juxtacortical lesion with a small amount of surrounding edema. 3 mm peripherally enhancing juxtacortical lesion within the left frontal operculum with a small amount of surrounding edema. (Series 20, image 16). 1.5 x 0.7 cm focus of subcortical/juxtacrotical T2/FLAIR hyperintensity within the left parietooccipital lobe with adjacent curvilinear enhancement (series 15, image  30). 1.6 x 1.9 cm focus of T2/FLAIR hyperintensity within the left thalamus also involving portions of the posterior limb of left internal capsule and extending inferiorly toward the midbrain. Centrally within this lesion there is a peripherally enhancing focus measuring 0.6 cm (series 15, image 27) (series 19, image 86). In addition to the white matter disease demonstrated on prior brain MRI, there are several additional new small foci of predominantly subcortical/juxtacortical T2/FLAIR hyperintensity, some of which demonstrate associated curvilinear enhancement (for instance a lesion within the anterior right frontal lobe (series 15, image 35) (series 19, images 104-107). No definite intracranial blood products. Curvilinear SWI signal loss in the region of several of these lesions appears to reflect dilated venous vasculature. No evidence of acute infarct. No midline shift at the level of the septum pellucidum. Incidentally noted cavum septum pellucidum and cavum vergae. Vascular: Flow voids maintained within the proximal large arterial vessels. Skull and upper cervical spine: No focal marrow lesion. Sinuses/Orbits: Visualized orbits demonstrate no acute abnormality. Mucous retention cyst within a posterior left ethmoid air cell. Frothy secretions within the bilateral sphenoid sinuses. Small bilateral mastoid effusions These results were called by telephone at the time of interpretation on 08/30/2019 at 11:30 am to provider [facility] , who verbally acknowledged  these results. IMPRESSION: 1. Multiple peripherally enhancing parenchymal lesions which are predominantly juxtacortical/subcortical in location with surrounding edema, new as compared to MRI 05/25/2019. A dominant 2 cm lesion within the anterior left frontal lobe has prominent surrounding edema with associated mass effect and resultant early rightward subfalcine herniation. 2. New 0.6 cm peripherally enhancing lesion with surrounding edema within the  left thalamus also involving portions of the posterior limb of left internal capsule and extending inferiorly toward the midbrain. 3. Multiple additional new juxtacortical/subcortical T2 hyperintense lesions, some of which demonstrate associated curvilinear enhancement. 4. The constellation of findings is nonspecific and favored differential considerations include tumefactive demyelination, atypical infection or metastatic disease. Electronically Signed: By: Jackey Loge On: 08/30/2019 11:31   Mr Cervical Spine W Or Wo Contrast  Result Date: 08/31/2019 CLINICAL DATA:  Multiple sclerosis.  Abnormal MRI of the brain. EXAM: MRI CERVICAL SPINE WITHOUT AND WITH CONTRAST TECHNIQUE: Multiplanar and multiecho pulse sequences of the cervical spine, to include the craniocervical junction and cervicothoracic junction, were obtained without and with intravenous contrast. CONTRAST:  10mL GADAVIST GADOBUTROL 1 MMOL/ML IV SOLN COMPARISON:  None. FINDINGS: Alignment: No significant listhesis or curvature is present. Vertebrae: Vertebral body heights and marrow signal are normal. Cord: Normal signal is present the cervical and upper thoracic spinal cord to the lowest imaged level, T2-3. Posterior Fossa, vertebral arteries, paraspinal tissues: Craniocervical junction is normal. Disc levels: No significant focal disc protrusion or stenosis is present. Central canal and foramina are patent throughout the cervical spine. IMPRESSION: 1. Normal MRI appearance of the cervical spine without and with contrast. 2. Normal appearance the spinal cord. No mass lesion or evidence for demyelination. 3. No evidence for metastatic disease. Electronically Signed   By: Marin Roberts M.D.   On: 08/31/2019 11:51   Ct Abdomen Pelvis W Contrast  Result Date: 08/30/2019 CLINICAL DATA:  Brain metastasis? Follow-up for lesions of brain seen on MRI this morning. Numbness in face and head. EXAM: CT CHEST, ABDOMEN, AND PELVIS WITH CONTRAST  TECHNIQUE: Multidetector CT imaging of the chest, abdomen and pelvis was performed following the standard protocol during bolus administration of intravenous contrast. CONTRAST:  OMNIPAQUE IOHEXOL 300 MG/ML  SOLN COMPARISON:  None. FINDINGS: CT CHEST FINDINGS Cardiovascular: Heart size is normal. No pericardial effusion. No thoracic aortic aneurysm. Mediastinum/Nodes: Single borderline pathologic by size criteria lymph node in the anterior mediastinum, with short axis measurement of 11 mm. No definitively enlarged or morphologically abnormal lymph nodes identified within the mediastinum or perihilar regions. Esophagus appears normal. Trachea and central bronchi are unremarkable. Lungs/Pleura: Lungs are clear. No pleural effusions. Musculoskeletal: No acute or suspicious osseous finding within the chest. Numerous small and mildly prominent lymph nodes in the bilateral axillary regions, including several mildly prominent retropectoral lymph nodes bilaterally, suspicious by number CT ABDOMEN PELVIS FINDINGS Hepatobiliary: No focal liver abnormality is seen. No gallstones, gallbladder wall thickening, or biliary dilatation. Pancreas: Unremarkable. No pancreatic ductal dilatation or surrounding inflammatory changes. Spleen: Normal in size without focal abnormality. Adrenals/Urinary Tract: Adrenal glands appear normal. Kidneys are unremarkable without mass, stone or hydronephrosis. Bladder is unremarkable. Stomach/Bowel: Stomach is within normal limits. Appendix appears normal. No evidence of bowel wall thickening, distention, or inflammatory changes. Appendix is normal. Stomach is unremarkable, partially decompressed. Vascular/Lymphatic: No vascular abnormality identified. Scattered small and mildly prominent lymph nodes within the retroperitoneum and iliac chain regions bilaterally, and within the inguinal regions bilaterally, none of which are definitively pathologic by CT size criteria. Reproductive: Bilateral  tubal  ligation clips. Other: No free fluid or abscess collection. No soft tissue mass identified within the abdomen or pelvis. Nodular thickening deep to the umbilicus, presumably postsurgical change related to the tubal ligation. Musculoskeletal: No acute or suspicious osseous finding. IMPRESSION: 1. No acute findings within the abdomen or pelvis. No evidence of primary malignancy or metastatic disease within the chest, abdomen or pelvis. 2. Single borderline pathologic by size criteria lymph node in the anterior mediastinum, with short axis measurement of 11 mm. No definitively enlarged or morphologically abnormal lymph nodes identified within the mediastinum or perihilar regions. 3. Numerous small and mildly prominent lymph nodes within the bilateral axillary regions, including several mildly prominent retropectoral lymph nodes bilaterally, none of which are definitively pathologic by CT size criteria but these are suspicious by number. 4. Scattered small and mildly prominent lymph nodes within the retroperitoneum and iliac chain regions bilaterally, and within the inguinal regions bilaterally, again none of which are definitively pathologic by CT size criteria. 5. Nodular thickening deep to the umbilicus, presumably postsurgical change related to the tubal ligation. Recommend correlation with surgical history. Electronically Signed   By: Bary Richard M.D.   On: 08/30/2019 13:36    Assessment:  The patient is a 33 y.o.  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  Differential diagnosis includes tumefactive demyelination, atypical infection (toxoplasmosis), metastatic disease, PML, or CNS lymphoma.  Chest, abdomen and pelvic CT reveals no evidence of metastatic disease.  The largest node is 1.1 cm in the anterior mediastinum.  She has tiny non-pathologic size lymph nodes.  Symptomatically, her right  facial numbness and loss of taste sensation has resolved.  She denies any B symptoms.  Exam reveals no adenopathy or hepatosplenomegaly.  Neurologic exam is intact.  Plan:   1.   Multiple CNS lesions  Images personally reviewed and discussed with radiology as well as neurosurgery with Dr Myer Haff.  Differential diagnosis as above.  No evidence of metastatic disease.  Anticipate LP per neurosurgery.  Sample to include glucose, protein, cell count, flow cytometry, cytology, as well as oligoclonal bands etc per neurology and ID work-up per infectious disease.  As patient clinically stable, hold off on steroids until diagnosis (discussed with neurosurgery).  If patient has CNS lymphoma, would require tertiary care hospital for HD MTX based treatment. 2.   Consult neurosurgery.  Patient to be seen by Dr Adriana Simas today. 3.   Follow-up on serologies per infectious disease.  HIV  RNA and CD4 count pending. 4.   Additional studies ordered: hepatitis B and C serologies, immunoglobulin levels, LDH, uric acid.   Thank you for allowing me to participate in Brittiny Girouard 's care.  I will follow her closely with you while hospitalized and after discharge in the outpatient department.  Rosey Bath, MD  08/31/2019, 4:06 PM

## 2019-09-01 LAB — BASIC METABOLIC PANEL
Anion gap: 10 (ref 5–15)
BUN: 15 mg/dL (ref 6–20)
CO2: 24 mmol/L (ref 22–32)
Calcium: 9.1 mg/dL (ref 8.9–10.3)
Chloride: 99 mmol/L (ref 98–111)
Creatinine, Ser: 0.56 mg/dL (ref 0.44–1.00)
GFR calc Af Amer: >60 mL/min (ref >60)
GFR calc non Af Amer: >60 mL/min (ref >60)
Glucose, Bld: 104 mg/dL — ABNORMAL HIGH (ref 70–99)
Potassium: 3.3 mmol/L — ABNORMAL LOW (ref 3.5–5.1)
Sodium: 133 mmol/L — ABNORMAL LOW (ref 135–145)

## 2019-09-01 LAB — TOXOPLASMA ANTIBODIES- IGG AND  IGM
Toxoplasma Antibody- IgM: 6.1 AU/mL (ref 0.0–7.9)
Toxoplasma IgG Ratio: >400 IU/mL — ABNORMAL HIGH (ref 0.0–7.1)

## 2019-09-01 LAB — HEPATITIS B CORE ANTIBODY, TOTAL: Hep B Core Total Ab: REACTIVE — AB

## 2019-09-01 LAB — HEPATITIS C ANTIBODY: HCV Ab: NONREACTIVE

## 2019-09-01 LAB — URIC ACID: Uric Acid, Serum: 3.9 mg/dL (ref 2.5–7.1)

## 2019-09-01 LAB — HEPATITIS B SURFACE ANTIGEN: Hepatitis B Surface Ag: NONREACTIVE

## 2019-09-01 LAB — LACTATE DEHYDROGENASE: LDH: 167 U/L (ref 98–192)

## 2019-09-01 MED ORDER — SODIUM CHLORIDE 0.9 % IV SOLN
INTRAVENOUS | Status: DC | PRN
  Administered 2019-09-01 – 2019-09-02 (×2): via INTRAVENOUS

## 2019-09-01 MED ORDER — SULFAMETHOXAZOLE-TRIMETHOPRIM 400-80 MG/5ML IV SOLN
600.0000 mg | Freq: Two times a day (BID) | INTRAVENOUS | Status: DC
  Administered 2019-09-01 – 2019-09-03 (×4): 600 mg via INTRAVENOUS
  Filled 2019-09-01 (×10): qty 37.5

## 2019-09-01 MED ORDER — POTASSIUM CHLORIDE CRYS ER 20 MEQ PO TBCR
40.0000 meq | EXTENDED_RELEASE_TABLET | Freq: Once | ORAL | Status: AC
  Administered 2019-09-01: 12:00:00 40 meq via ORAL
  Filled 2019-09-01: qty 2

## 2019-09-01 NOTE — Progress Notes (Signed)
ID  Pt doing okay Says her numbness of the face and tongue have resolved Patient Vitals for the past 24 hrs:  BP Temp Temp src Pulse Resp SpO2  09/01/19 1556 (!) 128/100 98.9 F (37.2 C) Oral (!) 107 17 99 %  09/01/19 0413 119/79 98.6 F (37 C) Oral 90 17 98 %  08/31/19 1952 105/78 98.6 F (37 C) Oral 91 18 97 %   Awake,[id number] Chest cta [id number] CNS non focal  Labs T0xoplasma IgG >400 ( range 0-7) RPR NR HIV RNA 1.3 million copies Cd4 370 ( 16.8%) Crypto neg Quantiferon gold pending JC virus PCR VZV PCR EBV/CMV PCR pending  Impression/Recommendation  HIV- on no HAARt since 2010. Cd4 370 with 16%. 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 As LP currently may be risky after discussing with neurologist we will treat for Toxo with Po sulfasalazine+ pyrimethamine ( 200mg  loading dose and then 75mg  every day) + leucovorin 25mg  every day Until we get these medicines ( has to be ordered) Will treat with IV trimethoprim+ sulfamethoxazole 5mg /Kg Q 12. After 2 weeks of treatment we can repeat MRI to see whether there is improvement in the lesions. 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  Discussed the management with neurologist and hospitalist

## 2019-09-01 NOTE — Progress Notes (Signed)
10/07: - No major neurological event overnight - this Am, patient states that her right face tingling/numbness has resolved. Her taste is back to baseline.  Neuro exam remains stable and has improved  Alert, awake, oriented x4, speech is nle, follows commands PERLA, EOMI, no nystagmus, VFF, face symmetrical, face sensation to pinprick and touch is nle,uvula/tongue midline No motor deficit appreciated No sensory deficit appreciated No coordination deficit appreciated DTR and gait not cheked at time of exam   RECS: - Continue neuro protective measures while admitted including normothermia, normoglycemia, correct electrolytes/metabolic abnliites, treat infection - Appreciate neurosurgery recs - Appreciate heme-onc recs - Appreciate ID consult - for now hold on steroids until exploration done or worsenig     10/06 Events overnight noted. No major neuro changes overnight. Patient states that tingling/numbness of right face is getting better as well as her taste.  CT chest/abdome/pelvis:  No acute findings within the abdomen or pelvis. No evidence of primary malignancy or metastatic disease within the chest, abdomen or pelvis.Single borderline pathologic by size criteria lymph node in the anterior mediastinum, with short axis measurement of 11 mm. No definitively enlarged or morphologically abnormal lymph nodes identified within the mediastinum or perihilar regions.Numerous small and mildly prominent lymph nodes within thebilateral axillary regions, including several mildly prominentretropectoral lymph nodes bilaterally, none of which are definitively pathologic by CT size criteria but these are suspicious by number.Scattered small and mildly prominent lymph nodes within the retroperitoneum and iliac chain regions bilaterally, and within the inguinal regions bilaterally, again none of which are definitively pathologic by CT size criteria.Nodular thickening deep to the umbilicus,  presumably postsurgicalchange related to the tubal ligation. Recommend correlation withsurgical history.  Labs per EMR  Neuro exam remains unchanged and stable Alert, awake, oriented x4, speech is nle, follows commands PERLA, EOMI, no nystagmus, VFF, right naso labial effacement, decrease sensation to pinprick and touch on the right face, uvula/tongue midline No motor deficit appreciated No sensory deficit appreciated No coordination deficit appreciated DTR and gait not cheked at time of exam  Recs:  - Continue neuro protective measures while admitted including normothermia, normoglycemia, correct electrolytes/metabolic abnliites, treat infection - I spoke with heme onc regarding findings of CT Abdomen/chest/pelvis. They will see her today. Appreciate their input - Appreciate ID consult - neurosurgery consult if biopsy indicated - for now hold on steroids until exploration done or worsenig     10/05: Admission day  HPI: Penny Young is an 33 y.o. female  Being admitted with right face numbness/tingling  She is a 33 y/o with h/o of HTN who presents to ER with right face numbness,. Patient states that she started having numbness in her lower, that spread to he right face. She also complains of tingling and lost of taste. She denies HA, visual disturbances/eye pain, denies nausea/vting, denies fever/cough, denies night sweat/weight loss. She denies any weakness. She admits for smoking a pack/week for "long time". She also states that 18 years ago she was tested positive for HIV and was not started on treatment b/c low viral load. She has a twin sister that has MS. There is no worsening of symptomatology. MRI obtained in ER: Multiple peripherally enhancing parenchymal lesions which arepredominantly juxtacortical/subcortical in location with surrounding edema, new as compared to MRI 05/25/2019. A dominant 2 cm lesion within the anterior left frontal lobe has prominent surrounding edema with  associated mass effect and resultant early rightward subfalcine herniation. New 0.6 cm peripherally enhancing lesion with surrounding edema within the left  thalamus also involving portions of the posterior limb of left internal capsule and extending inferiorly toward the midbrain.Multiple additional new juxtacortical/subcortical T2 hyperintense lesions, some of which demonstrate associated curvilinear enhancement. The constellation of findings is nonspecific and favoreddifferential considerations include tumefactive demyelination, atypical infection or metastatic disease.  Previous MRI on 6/30: Scattered small subcortical white matter hyperintensities Bilaterally for what seems to be a right arm numbness with difficulty holding objects ( notes on 6/30).   Past Medical History:  Diagnosis Date  . Asthma   . Bipolar 1 disorder (HCC)   . Drug abuse (HCC)   . Hepatitis B   . HIV (human immunodeficiency virus infection) (HCC)   . Hypertension   . Obese   . Restless leg syndrome   . Schizophrenia [facility])     Past Surgical History:  Procedure Laterality Date  . HERNIA REPAIR    . TUBAL LIGATION    . TYMPANOSTOMY TUBE PLACEMENT      Family History  Problem Relation Age of Onset  . Hypertension Other     Social History:  reports that she has been smoking cigarettes. She has been smoking about 0.10 packs per day. She has never used smokeless tobacco. She reports current alcohol use. She reports previous drug use. Drug: Marijuana.  Allergies  Allergen Reactions  . Aspirin Itching    Medications: I have reviewed the patient's current medications.  ROS: As per HPI Physical Examination: Blood pressure 119/79, pulse 90, temperature 98.6 F (37 C), temperature source Oral, resp. rate 17, height 5\' 7"  (1.702 m), weight 122 kg, last menstrual period 08/30/2019, SpO2 98 %.  Neurologic Examination Alert, awake, oriented x4, speech is nle, follows commands PERLA, EOMI, no nystagmus, VFF,  right naso labial effacement, decrease sensation to pinprick and touch on the right face, uvula/tongue midline No motor deficit appreciated No sensory deficit appreciated No coordination deficit appreciated DTR and gait not cheked at time of exam   Results for orders placed or performed during the hospital encounter of 08/30/19 (from the past 48 hour(s))  Pregnancy, urine POC     Status: None   Collection Time: 08/30/19  8:39 AM  Result Value Ref Range   Preg Test, Ur NEGATIVE NEGATIVE    Comment:        THE SENSITIVITY OF THIS METHODOLOGY IS >24 mIU/mL   SARS CORONAVIRUS 2 (TAT 6-24 HRS) Nasopharyngeal Nasopharyngeal Swab     Status: None   Collection Time: 08/30/19  1:32 PM   Specimen: Nasopharyngeal Swab  Result Value Ref Range   SARS Coronavirus 2 NEGATIVE NEGATIVE    Comment: (NOTE) SARS-CoV-2 target nucleic acids are NOT DETECTED. The SARS-CoV-2 RNA is generally detectable in upper and lower respiratory specimens during the acute phase of infection. Negative results do not preclude SARS-CoV-2 infection, do not rule out co-infections with other pathogens, and should not be used as the sole basis for treatment or other patient management decisions. Negative results must be combined with clinical observations, patient history, and epidemiological information. The expected result is Negative. Fact Sheet for Patients: 10/30/19 Fact Sheet for Healthcare Providers: HairSlick.no This test is not yet approved or cleared by the [place] FDA and  has been authorized for detection and/or diagnosis of SARS-CoV-2 by FDA under an Emergency Use Authorization (EUA). This EUA will remain  in effect (meaning this test can be used) for the duration of the COVID-19 declaration under Section 56 4(b)(1) of the Act, 21 U.S.C. section 360bbb-3(b)(1), unless the authorization is  terminated or revoked sooner. Performed at [facility], [address], [place], [place] [address]   Helper T-Lymph-CD4 [facility] only)     Status: Abnormal   Collection Time: 08/30/19  3:24 PM  Result Value Ref Range   Absolute CD 4 Helper 370 359 - 1,519 /uL   % CD 4 Pos. Lymph. 16.8 (L) 30.8 - 58.5 %   WBC 5.6 3.4 - 10.8 x10E3/uL   RBC 5.85 (H) 3.77 - 5.28 x10E6/uL   Hematocrit 46.0 34.0 - 46.6 %   MCV 79 79 - 97 fL   MCH 26.3 (L) 26.6 - 33.0 pg   MCHC 33.5 31.5 - 35.7 g/dL   RDW 19.1 47.8 - 29.5 %   Platelets 277 150 - 450 x10E3/uL   Neutrophils 46 Not Estab. %   Lymphs 39 Not Estab. %   Monocytes 10 Not Estab. %   Eos 4 Not Estab. %   Basos 1 Not Estab. %   Neutrophils Absolute 2.7 1.4 - 7.0 x10E3/uL   Lymphocytes Absolute 2.2 0.7 - 3.1 x10E3/uL   Monocytes Absolute 0.6 0.1 - 0.9 x10E3/uL   EOS (ABSOLUTE) 0.2 0.0 - 0.4 x10E3/uL   Basophils Absolute 0.0 0.0 - 0.2 x10E3/uL   Immature Granulocytes 0 Not Estab. %   Immature Grans (Abs) 0.0 0.0 - 0.1 x10E3/uL    Comment: (NOTE) Performed At: [facility] [address] [place], [place] [id number] Jolene Schimke MD QI:[id number]    Hemoglobin 15.4 11.1 - 15.9 g/dL  HIV-1 RNA quant-no reflex-bld     Status: None   Collection Time: 08/30/19  3:24 PM  Result Value Ref Range   HIV 1 RNA Quant 1,180,000 copies/mL    Comment: (NOTE) The reportable range for this assay is 20 to 10,000,000 copies HIV-1 RNA/mL.    LOG10 HIV-1 RNA 6.072 log10copy/mL    Comment: (NOTE) Performed At: [facility] [address] [place], [place] [id number] Jolene Schimke MD OZ:[id number]   Cryptococcal antigen     Status: None   Collection Time: 08/30/19  6:07 PM  Result Value Ref Range   Crypto Ag NEGATIVE NEGATIVE   Cryptococcal Ag Titer NOT INDICATED NOT INDICATED    Comment: Performed at [facility], [address], [place], [place] [address]  RPR     Status: None   Collection Time: 08/30/19  6:11 PM  Result Value Ref Range   RPR Ser Ql NON REACTIVE NON  REACTIVE    Comment: Performed at [facility], [address], [place], [place] [address]  Basic metabolic panel     Status: Abnormal   Collection Time: 08/31/19  4:15 AM  Result Value Ref Range   Sodium 130 (L) 135 - 145 mmol/L   Potassium 3.4 (L) 3.5 - 5.1 mmol/L   Chloride 97 (L) 98 - 111 mmol/L   CO2 26 22 - 32 mmol/L   Glucose, Bld 95 70 - 99 mg/dL   BUN 10 6 - 20 mg/dL   Creatinine, Ser 4.33 0.44 - 1.00 mg/dL   Calcium 8.8 (L) 8.9 - 10.3 mg/dL   GFR calc non Af Amer >60 >60 mL/min   GFR calc Af Amer >60 >60 mL/min   Anion gap 7 5 - 15    Comment: Performed at [facility], [address] [place], [place] [address]  CBC     Status: Abnormal   Collection Time: 08/31/19  4:15 AM  Result Value Ref Range  WBC 4.1 4.0 - 10.5 K/uL   RBC 4.88 3.87 - 5.11 MIL/uL   Hemoglobin 12.7 12.0 - 15.0 g/dL   HCT 95.9 74.7 - 18.5 %   MCV 77.3 (L) 80.0 - 100.0 fL   MCH 26.0 26.0 - 34.0 pg   MCHC 33.7 30.0 - 36.0 g/dL   RDW 50.1 58.6 - 82.5 %   Platelets 266 150 - 400 K/uL   nRBC 0.0 0.0 - 0.2 %    Comment: Performed at [facility], [address] [place], [place] [address]  Magnesium     Status: None   Collection Time: 08/31/19  4:15 AM  Result Value Ref Range   Magnesium 1.9 1.7 - 2.4 mg/dL    Comment: Performed at [facility], [address] [place], [place] [address]  Basic metabolic panel     Status: Abnormal   Collection Time: 09/01/19  4:42 AM  Result Value Ref Range   Sodium 133 (L) 135 - 145 mmol/L   Potassium 3.3 (L) 3.5 - 5.1 mmol/L   Chloride 99 98 - 111 mmol/L   CO2 24 22 - 32 mmol/L   Glucose, Bld 104 (H) 70 - 99 mg/dL   BUN 15 6 - 20 mg/dL   Creatinine, Ser 7.15 0.44 - 1.00 mg/dL   Calcium 9.1 8.9 - 95.3 mg/dL   GFR calc non Af Amer >60 >60 mL/min   GFR calc Af Amer >60 >60 mL/min   Anion gap 10 5 - 15    Comment: Performed at [facility], [address], [place], [place] [address]  Lactate dehydrogenase      Status: None   Collection Time: 09/01/19  4:42 AM  Result Value Ref Range   LDH 167 98 - 192 U/L    Comment: Performed at [facility], [address] [place], [place] [address]  Uric acid     Status: None   Collection Time: 09/01/19  4:42 AM  Result Value Ref Range   Uric Acid, Serum 3.9 2.5 - 7.1 mg/dL    Comment: Performed at [facility], [address] [place], [place] [address]    Recent Results (from the past 240 hour(s))  SARS CORONAVIRUS 2 (TAT 6-24 HRS) Nasopharyngeal Nasopharyngeal Swab     Status: None   Collection Time: 08/30/19  1:32 PM   Specimen: Nasopharyngeal Swab  Result Value Ref Range Status   SARS Coronavirus 2 NEGATIVE NEGATIVE Final    Comment: (NOTE) SARS-CoV-2 target nucleic acids are NOT DETECTED. The SARS-CoV-2 RNA is generally detectable in upper and lower respiratory specimens during the acute phase of infection. Negative results do not preclude SARS-CoV-2 infection, do not rule out co-infections with other pathogens, and should not be used as the sole basis for treatment or other patient management decisions. Negative results must be combined with clinical observations, patient history, and epidemiological information. The expected result is Negative. Fact Sheet for Patients: HairSlick.no Fact Sheet for Healthcare Providers: quierodirigir.com This test is not yet approved or cleared by the [place] FDA and  has been authorized for detection and/or diagnosis of SARS-CoV-2 by FDA under an Emergency Use Authorization (EUA). This EUA will remain  in effect (meaning this test can be used) for the duration of the COVID-19 declaration under Section 56 4(b)(1) of the Act, 21 U.S.C. section 360bbb-3(b)(1), unless the authorization is terminated or revoked sooner. Performed at [facility], [address], [place], [place] [address]  Ct Chest W Contrast  Result  Date: 08/30/2019 CLINICAL DATA:  Brain metastasis? Follow-up for lesions of brain seen on MRI this morning. Numbness in face and head. EXAM: CT CHEST, ABDOMEN, AND PELVIS WITH CONTRAST TECHNIQUE: Multidetector CT imaging of the chest, abdomen and pelvis was performed following the standard protocol during bolus administration of intravenous contrast. CONTRAST:  OMNIPAQUE IOHEXOL 300 MG/ML  SOLN COMPARISON:  None. FINDINGS: CT CHEST FINDINGS Cardiovascular: Heart size is normal. No pericardial effusion. No thoracic aortic aneurysm. Mediastinum/Nodes: Single borderline pathologic by size criteria lymph node in the anterior mediastinum, with short axis measurement of 11 mm. No definitively enlarged or morphologically abnormal lymph nodes identified within the mediastinum or perihilar regions. Esophagus appears normal. Trachea and central bronchi are unremarkable. Lungs/Pleura: Lungs are clear. No pleural effusions. Musculoskeletal: No acute or suspicious osseous finding within the chest. Numerous small and mildly prominent lymph nodes in the bilateral axillary regions, including several mildly prominent retropectoral lymph nodes bilaterally, suspicious by number CT ABDOMEN PELVIS FINDINGS Hepatobiliary: No focal liver abnormality is seen. No gallstones, gallbladder wall thickening, or biliary dilatation. Pancreas: Unremarkable. No pancreatic ductal dilatation or surrounding inflammatory changes. Spleen: Normal in size without focal abnormality. Adrenals/Urinary Tract: Adrenal glands appear normal. Kidneys are unremarkable without mass, stone or hydronephrosis. Bladder is unremarkable. Stomach/Bowel: Stomach is within normal limits. Appendix appears normal. No evidence of bowel wall thickening, distention, or inflammatory changes. Appendix is normal. Stomach is unremarkable, partially decompressed. Vascular/Lymphatic: No vascular abnormality identified. Scattered small and mildly prominent lymph nodes within the  retroperitoneum and iliac chain regions bilaterally, and within the inguinal regions bilaterally, none of which are definitively pathologic by CT size criteria. Reproductive: Bilateral tubal ligation clips. Other: No free fluid or abscess collection. No soft tissue mass identified within the abdomen or pelvis. Nodular thickening deep to the umbilicus, presumably postsurgical change related to the tubal ligation. Musculoskeletal: No acute or suspicious osseous finding. IMPRESSION: 1. No acute findings within the abdomen or pelvis. No evidence of primary malignancy or metastatic disease within the chest, abdomen or pelvis. 2. Single borderline pathologic by size criteria lymph node in the anterior mediastinum, with short axis measurement of 11 mm. No definitively enlarged or morphologically abnormal lymph nodes identified within the mediastinum or perihilar regions. 3. Numerous small and mildly prominent lymph nodes within the bilateral axillary regions, including several mildly prominent retropectoral lymph nodes bilaterally, none of which are definitively pathologic by CT size criteria but these are suspicious by number. 4. Scattered small and mildly prominent lymph nodes within the retroperitoneum and iliac chain regions bilaterally, and within the inguinal regions bilaterally, again none of which are definitively pathologic by CT size criteria. 5. Nodular thickening deep to the umbilicus, presumably postsurgical change related to the tubal ligation. Recommend correlation with surgical history. Electronically Signed   By: Bary Richard M.D.   On: 08/30/2019 13:36   Mr Laqueta Jean And Wo Contrast  Addendum Date: 08/30/2019   ADDENDUM REPORT: 08/30/2019 11:55 ADDENDUM: In addition to atypical infection (i.e. Toxoplasmosis), tumefactive demyelination and metastatic disease, CNS lymphoma is also a differential consideration. Correlate with clinically and with CD4 count. Addendum called by telephone at the time of  interpretation on 08/30/2019 at 11:54 am to provider [facility] , who verbally acknowledged these results. Electronically Signed   By: Jackey Loge   On: 08/30/2019 11:55   Result Date: 08/30/2019 CLINICAL DATA:  Focal neuro deficit, greater than 6 hours, stroke suspected. Additional history provided: Patient reports right-sided  throat, lip and tongue numbness since 08/20/2019, loss of taste. EXAM: MRI HEAD WITHOUT AND WITH CONTRAST TECHNIQUE: Multiplanar, multiecho pulse sequences of the brain and surrounding structures were obtained without and with intravenous contrast. CONTRAST:  10mL GADAVIST GADOBUTROL 1 MMOL/ML IV SOLN COMPARISON:  Brain MRI 05/25/2019 FINDINGS: Brain: There are multiple intracranial enhancing lesions which are new as compared to prior MRI 05/25/2019. Within the subcortical white matter of the anterior left frontal lobe, there is a 2.0 x 1.6 x 1.7 cm (AP x TV x CC) peripherally enhancing lesion with prominent surrounding edema. Resultant mass effect with rightward bowing of the anterior falx and early rightward subfalcine herniation. More posteriorly within the lateral left frontal lobe, there is a 0.4 cm peripherally enhancing juxtacortical lesion with a small amount of surrounding edema. 3 mm peripherally enhancing juxtacortical lesion within the left frontal operculum with a small amount of surrounding edema. (Series 20, image 16). 1.5 x 0.7 cm focus of subcortical/juxtacrotical T2/FLAIR hyperintensity within the left parietooccipital lobe with adjacent curvilinear enhancement (series 15, image 30). 1.6 x 1.9 cm focus of T2/FLAIR hyperintensity within the left thalamus also involving portions of the posterior limb of left internal capsule and extending inferiorly toward the midbrain. Centrally within this lesion there is a peripherally enhancing focus measuring 0.6 cm (series 15, image 27) (series 19, image 86). In addition to the white matter disease demonstrated on prior brain MRI,  there are several additional new small foci of predominantly subcortical/juxtacortical T2/FLAIR hyperintensity, some of which demonstrate associated curvilinear enhancement (for instance a lesion within the anterior right frontal lobe (series 15, image 35) (series 19, images 104-107). No definite intracranial blood products. Curvilinear SWI signal loss in the region of several of these lesions appears to reflect dilated venous vasculature. No evidence of acute infarct. No midline shift at the level of the septum pellucidum. Incidentally noted cavum septum pellucidum and cavum vergae. Vascular: Flow voids maintained within the proximal large arterial vessels. Skull and upper cervical spine: No focal marrow lesion. Sinuses/Orbits: Visualized orbits demonstrate no acute abnormality. Mucous retention cyst within a posterior left ethmoid air cell. Frothy secretions within the bilateral sphenoid sinuses. Small bilateral mastoid effusions These results were called by telephone at the time of interpretation on 08/30/2019 at 11:30 am to [facility] , who verbally acknowledged these results. IMPRESSION: 1. Multiple peripherally enhancing parenchymal lesions which are predominantly juxtacortical/subcortical in location with surrounding edema, new as compared to MRI 05/25/2019. A dominant 2 cm lesion within the anterior left frontal lobe has prominent surrounding edema with associated mass effect and resultant early rightward subfalcine herniation. 2. New 0.6 cm peripherally enhancing lesion with surrounding edema within the left thalamus also involving portions of the posterior limb of left internal capsule and extending inferiorly toward the midbrain. 3. Multiple additional new juxtacortical/subcortical T2 hyperintense lesions, some of which demonstrate associated curvilinear enhancement. 4. The constellation of findings is nonspecific and favored differential considerations include tumefactive demyelination,  atypical infection or metastatic disease. Electronically Signed: By: Jackey LogeKyle  Golden On: 08/30/2019 11:31   Mr Cervical Spine W Or Wo Contrast  Result Date: 08/31/2019 CLINICAL DATA:  Multiple sclerosis.  Abnormal MRI of the brain. EXAM: MRI CERVICAL SPINE WITHOUT AND WITH CONTRAST TECHNIQUE: Multiplanar and multiecho pulse sequences of the cervical spine, to include the craniocervical junction and cervicothoracic junction, were obtained without and with intravenous contrast. CONTRAST:  10mL GADAVIST GADOBUTROL 1 MMOL/ML IV SOLN COMPARISON:  None. FINDINGS: Alignment: No significant listhesis or curvature is present.  Vertebrae: Vertebral body heights and marrow signal are normal. Cord: Normal signal is present the cervical and upper thoracic spinal cord to the lowest imaged level, T2-3. Posterior Fossa, vertebral arteries, paraspinal tissues: Craniocervical junction is normal. Disc levels: No significant focal disc protrusion or stenosis is present. Central canal and foramina are patent throughout the cervical spine. IMPRESSION: 1. Normal MRI appearance of the cervical spine without and with contrast. 2. Normal appearance the spinal cord. No mass lesion or evidence for demyelination. 3. No evidence for metastatic disease. Electronically Signed   By: Marin Robertshristopher  Mattern M.D.   On: 08/31/2019 11:51   Ct Abdomen Pelvis W Contrast  Result Date: 08/30/2019 CLINICAL DATA:  Brain metastasis? Follow-up for lesions of brain seen on MRI this morning. Numbness in face and head. EXAM: CT CHEST, ABDOMEN, AND PELVIS WITH CONTRAST TECHNIQUE: Multidetector CT imaging of the chest, abdomen and pelvis was performed following the standard protocol during bolus administration of intravenous contrast. CONTRAST:  100mL OMNIPAQUE IOHEXOL 300 MG/ML  SOLN COMPARISON:  None. FINDINGS: CT CHEST FINDINGS Cardiovascular: Heart size is normal. No pericardial effusion. No thoracic aortic aneurysm. Mediastinum/Nodes: Single borderline  pathologic by size criteria lymph node in the anterior mediastinum, with short axis measurement of 11 mm. No definitively enlarged or morphologically abnormal lymph nodes identified within the mediastinum or perihilar regions. Esophagus appears normal. Trachea and central bronchi are unremarkable. Lungs/Pleura: Lungs are clear. No pleural effusions. Musculoskeletal: No acute or suspicious osseous finding within the chest. Numerous small and mildly prominent lymph nodes in the bilateral axillary regions, including several mildly prominent retropectoral lymph nodes bilaterally, suspicious by number CT ABDOMEN PELVIS FINDINGS Hepatobiliary: No focal liver abnormality is seen. No gallstones, gallbladder wall thickening, or biliary dilatation. Pancreas: Unremarkable. No pancreatic ductal dilatation or surrounding inflammatory changes. Spleen: Normal in size without focal abnormality. Adrenals/Urinary Tract: Adrenal glands appear normal. Kidneys are unremarkable without mass, stone or hydronephrosis. Bladder is unremarkable. Stomach/Bowel: Stomach is within normal limits. Appendix appears normal. No evidence of bowel wall thickening, distention, or inflammatory changes. Appendix is normal. Stomach is unremarkable, partially decompressed. Vascular/Lymphatic: No vascular abnormality identified. Scattered small and mildly prominent lymph nodes within the retroperitoneum and iliac chain regions bilaterally, and within the inguinal regions bilaterally, none of which are definitively pathologic by CT size criteria. Reproductive: Bilateral tubal ligation clips. Other: No free fluid or abscess collection. No soft tissue mass identified within the abdomen or pelvis. Nodular thickening deep to the umbilicus, presumably postsurgical change related to the tubal ligation. Musculoskeletal: No acute or suspicious osseous finding. IMPRESSION: 1. No acute findings within the abdomen or pelvis. No evidence of primary malignancy or  metastatic disease within the chest, abdomen or pelvis. 2. Single borderline pathologic by size criteria lymph node in the anterior mediastinum, with short axis measurement of 11 mm. No definitively enlarged or morphologically abnormal lymph nodes identified within the mediastinum or perihilar regions. 3. Numerous small and mildly prominent lymph nodes within the bilateral axillary regions, including several mildly prominent retropectoral lymph nodes bilaterally, none of which are definitively pathologic by CT size criteria but these are suspicious by number. 4. Scattered small and mildly prominent lymph nodes within the retroperitoneum and iliac chain regions bilaterally, and within the inguinal regions bilaterally, again none of which are definitively pathologic by CT size criteria. 5. Nodular thickening deep to the umbilicus, presumably postsurgical change related to the tubal ligation. Recommend correlation with surgical history. Electronically Signed   By: Bary RichardStan  Maynard M.D.   On: 08/30/2019  13:36     Assessment/Plan: She is a 33 y/o with h/o of HTN who presents to ER with right face numbness/tingling for a week duration. Neuro exam with right facial/decrease sensation to pinprick and touch on the right face. MRI concerning for atypical infection vs mets vs MD vs primary brain lesion ( lymphoma??)  RECS: - Neuro protective measures including normothermia, normoglycemia, correct electrolytes/metabolic abnliites, treat infection - Obtain MRI w/wo of cervical spine - Obtain CT chest abdomen and pelvis wwo contrast - HIV panel - Consider ID consult - Consider neurosurgery consult if biopsy indicated - for now hold on steroids until exploration done or worsenig 09/01/2019, 8:36 AM

## 2019-09-01 NOTE — Progress Notes (Signed)
[place] at [facility]                                                                                                                                                                                  Patient Demographics   Penny Young, is a 33 y.o. female, DOB - 12/16/1985, ZOX:[id number]  Admit date - 08/30/2019   Admitting Physician Hillary Bow, MD  Outpatient Primary MD for the patient is System, Pcp Not In   LOS - 1  Subjective: Patient is asymptomatic.   Review of Systems:   CONSTITUTIONAL: No documented fever. No fatigue, weakness. No weight gain, no weight loss.  EYES: No blurry or double vision.  ENT: No tinnitus. No postnasal drip. No redness of the oropharynx.  RESPIRATORY: No cough, no wheeze, no hemoptysis. No dyspnea.  CARDIOVASCULAR: No chest pain. No orthopnea. No palpitations. No syncope.  GASTROINTESTINAL: No nausea, no vomiting or diarrhea. No abdominal pain. No melena or hematochezia.  GENITOURINARY: No dysuria or hematuria.  ENDOCRINE: No polyuria or nocturia. No heat or cold intolerance.  HEMATOLOGY: No anemia. No bruising. No bleeding.  INTEGUMENTARY: No rashes. No lesions.  MUSCULOSKELETAL: No arthritis. No swelling. No gout.  NEUROLOGIC: No numbness, tingling, or ataxia. No seizure-type activity.  PSYCHIATRIC: No anxiety. No insomnia. No ADD.    Vitals:   Vitals:   08/31/19 0903 08/31/19 1449 08/31/19 1952 09/01/19 0413  BP: 134/86 124/86 105/78 119/79  Pulse: 97 99 91 90  Resp:  18 18 17   Temp:  98.5 F (36.9 C) 98.6 F (37 C) 98.6 F (37 C)  TempSrc:  Oral Oral Oral  SpO2:  99% 97% 98%  Weight:      Height:        Wt Readings from Last 3 Encounters:  08/30/19 122 kg  05/25/19 113.4 kg  09/01/18 104.3 kg    No intake or output data in the 24 hours ending 09/01/19 1503  Physical Exam:   GENERAL: Pleasant-appearing in no apparent distress.  HEAD, EYES, EARS, NOSE AND THROAT: Atraumatic, normocephalic.  Extraocular muscles are intact. Pupils equal and reactive to light. Sclerae anicteric. No conjunctival injection. No oro-pharyngeal erythema.  NECK: Supple. There is no jugular venous distention. No bruits, no lymphadenopathy, no thyromegaly.  HEART: Regular rate and rhythm,. No murmurs, no rubs, no clicks.  LUNGS: Clear to auscultation bilaterally. No rales or rhonchi. No wheezes.  ABDOMEN: Soft, flat, nontender, nondistended. Has good bowel sounds. No hepatosplenomegaly appreciated.  EXTREMITIES: No evidence of any cyanosis, clubbing, or peripheral edema.  +2 pedal and radial pulses bilaterally.  NEUROLOGIC: The patient is alert, awake, and oriented x3 with no focal motor or  sensory deficits appreciated bilaterally.  SKIN: Moist and warm with no rashes appreciated.  Psych: Not anxious, depressed LN: No inguinal LN enlargement    Antibiotics   Anti-infectives (From admission, onward)   Start     Dose/Rate Route Frequency Ordered Stop   09/01/19 1400  sulfamethoxazole-trimethoprim (BACTRIM) 600 mg in dextrose 5 % 500 mL IVPB    Note to Pharmacy: She has CNS toxo- trimethoprim /KG every 12 hours   600 mg 358.3 mL/hr over 90 Minutes Intravenous Every 12 hours 09/01/19 1255        Medications   Scheduled Meds: . amLODipine  5 mg Oral Daily  . enoxaparin (LOVENOX) injection  40 mg Subcutaneous Q12H  . nicotine  21 mg Transdermal Daily   Continuous Infusions: . sulfamethoxazole-trimethoprim     PRN Meds:.acetaminophen **OR** acetaminophen, albuterol, ondansetron **OR** ondansetron (ZOFRAN) IV, polyethylene glycol   Data Review:   Micro Results Recent Results (from the past 240 hour(s))  SARS CORONAVIRUS 2 (TAT 6-24 HRS) Nasopharyngeal Nasopharyngeal Swab     Status: None   Collection Time: 08/30/19  1:32 PM   Specimen: Nasopharyngeal Swab  Result Value Ref Range Status   SARS Coronavirus 2 NEGATIVE NEGATIVE Final    Comment: (NOTE) SARS-CoV-2 target nucleic acids are NOT  DETECTED. The SARS-CoV-2 RNA is generally detectable in upper and lower respiratory specimens during the acute phase of infection. Negative results do not preclude SARS-CoV-2 infection, do not rule out co-infections with other pathogens, and should not be used as the sole basis for treatment or other patient management decisions. Negative results must be combined with clinical observations, patient history, and epidemiological information. The expected result is Negative. Fact Sheet for Patients: HairSlick.no Fact Sheet for Healthcare Providers: quierodirigir.com This test is not yet approved or cleared by the [place] FDA and  has been authorized for detection and/or diagnosis of SARS-CoV-2 by FDA under an Emergency Use Authorization (EUA). This EUA will remain  in effect (meaning this test can be used) for the duration of the COVID-19 declaration under Section 56 4(b)(1) of the Act, 21 U.S.C. section 360bbb-3(b)(1), unless the authorization is terminated or revoked sooner. Performed at [facility], [address], [place], [place] [address]     Radiology Reports Ct Chest W Contrast  Result Date: 08/30/2019 CLINICAL DATA:  Brain metastasis? Follow-up for lesions of brain seen on MRI this morning. Numbness in face and head. EXAM: CT CHEST, ABDOMEN, AND PELVIS WITH CONTRAST TECHNIQUE: Multidetector CT imaging of the chest, abdomen and pelvis was performed following the standard protocol during bolus administration of intravenous contrast. CONTRAST:  OMNIPAQUE IOHEXOL 300 MG/ML  SOLN COMPARISON:  None. FINDINGS: CT CHEST FINDINGS Cardiovascular: Heart size is normal. No pericardial effusion. No thoracic aortic aneurysm. Mediastinum/Nodes: Single borderline pathologic by size criteria lymph node in the anterior mediastinum, with short axis measurement of 11 mm. No definitively enlarged or morphologically abnormal lymph  nodes identified within the mediastinum or perihilar regions. Esophagus appears normal. Trachea and central bronchi are unremarkable. Lungs/Pleura: Lungs are clear. No pleural effusions. Musculoskeletal: No acute or suspicious osseous finding within the chest. Numerous small and mildly prominent lymph nodes in the bilateral axillary regions, including several mildly prominent retropectoral lymph nodes bilaterally, suspicious by number CT ABDOMEN PELVIS FINDINGS Hepatobiliary: No focal liver abnormality is seen. No gallstones, gallbladder wall thickening, or biliary dilatation. Pancreas: Unremarkable. No pancreatic ductal dilatation or surrounding inflammatory changes. Spleen: Normal in size without focal abnormality. Adrenals/Urinary Tract: Adrenal glands  appear normal. Kidneys are unremarkable without mass, stone or hydronephrosis. Bladder is unremarkable. Stomach/Bowel: Stomach is within normal limits. Appendix appears normal. No evidence of bowel wall thickening, distention, or inflammatory changes. Appendix is normal. Stomach is unremarkable, partially decompressed. Vascular/Lymphatic: No vascular abnormality identified. Scattered small and mildly prominent lymph nodes within the retroperitoneum and iliac chain regions bilaterally, and within the inguinal regions bilaterally, none of which are definitively pathologic by CT size criteria. Reproductive: Bilateral tubal ligation clips. Other: No free fluid or abscess collection. No soft tissue mass identified within the abdomen or pelvis. Nodular thickening deep to the umbilicus, presumably postsurgical change related to the tubal ligation. Musculoskeletal: No acute or suspicious osseous finding. IMPRESSION: 1. No acute findings within the abdomen or pelvis. No evidence of primary malignancy or metastatic disease within the chest, abdomen or pelvis. 2. Single borderline pathologic by size criteria lymph node in the anterior mediastinum, with short axis measurement  of 11 mm. No definitively enlarged or morphologically abnormal lymph nodes identified within the mediastinum or perihilar regions. 3. Numerous small and mildly prominent lymph nodes within the bilateral axillary regions, including several mildly prominent retropectoral lymph nodes bilaterally, none of which are definitively pathologic by CT size criteria but these are suspicious by number. 4. Scattered small and mildly prominent lymph nodes within the retroperitoneum and iliac chain regions bilaterally, and within the inguinal regions bilaterally, again none of which are definitively pathologic by CT size criteria. 5. Nodular thickening deep to the umbilicus, presumably postsurgical change related to the tubal ligation. Recommend correlation with surgical history. Electronically Signed   By: Bary RichardStan  Maynard M.D.   On: 08/30/2019 13:36   Mr Laqueta JeanBrain W And Wo Contrast  Addendum Date: 08/30/2019   ADDENDUM REPORT: 08/30/2019 11:55 ADDENDUM: In addition to atypical infection (i.e. Toxoplasmosis), tumefactive demyelination and metastatic disease, CNS lymphoma is also a differential consideration. Correlate with clinically and with CD4 count. Addendum called by telephone at the time of interpretation on 08/30/2019 at 11:54 am to provider [facility] , who verbally acknowledged these results. Electronically Signed   By: Jackey LogeKyle  Golden   On: 08/30/2019 11:55   Result Date: 08/30/2019 CLINICAL DATA:  Focal neuro deficit, greater than 6 hours, stroke suspected. Additional history provided: Patient reports right-sided throat, lip and tongue numbness since 08/20/2019, loss of taste. EXAM: MRI HEAD WITHOUT AND WITH CONTRAST TECHNIQUE: Multiplanar, multiecho pulse sequences of the brain and surrounding structures were obtained without and with intravenous contrast. CONTRAST:  10mL GADAVIST GADOBUTROL 1 MMOL/ML IV SOLN COMPARISON:  Brain MRI 05/25/2019 FINDINGS: Brain: There are multiple intracranial enhancing lesions which are  new as compared to prior MRI 05/25/2019. Within the subcortical white matter of the anterior left frontal lobe, there is a 2.0 x 1.6 x 1.7 cm (AP x TV x CC) peripherally enhancing lesion with prominent surrounding edema. Resultant mass effect with rightward bowing of the anterior falx and early rightward subfalcine herniation. More posteriorly within the lateral left frontal lobe, there is a 0.4 cm peripherally enhancing juxtacortical lesion with a small amount of surrounding edema. 3 mm peripherally enhancing juxtacortical lesion within the left frontal operculum with a small amount of surrounding edema. (Series 20, image 16). 1.5 x 0.7 cm focus of subcortical/juxtacrotical T2/FLAIR hyperintensity within the left parietooccipital lobe with adjacent curvilinear enhancement (series 15, image 30). 1.6 x 1.9 cm focus of T2/FLAIR hyperintensity within the left thalamus also involving portions of the posterior limb of left internal capsule and extending inferiorly toward the midbrain. Centrally  within this lesion there is a peripherally enhancing focus measuring 0.6 cm (series 15, image 27) (series 19, image 86). In addition to the white matter disease demonstrated on prior brain MRI, there are several additional new small foci of predominantly subcortical/juxtacortical T2/FLAIR hyperintensity, some of which demonstrate associated curvilinear enhancement (for instance a lesion within the anterior right frontal lobe (series 15, image 35) (series 19, images 104-107). No definite intracranial blood products. Curvilinear SWI signal loss in the region of several of these lesions appears to reflect dilated venous vasculature. No evidence of acute infarct. No midline shift at the level of the septum pellucidum. Incidentally noted cavum septum pellucidum and cavum vergae. Vascular: Flow voids maintained within the proximal large arterial vessels. Skull and upper cervical spine: No focal marrow lesion. Sinuses/Orbits: Visualized  orbits demonstrate no acute abnormality. Mucous retention cyst within a posterior left ethmoid air cell. Frothy secretions within the bilateral sphenoid sinuses. Small bilateral mastoid effusions These results were called by telephone at the time of interpretation on 08/30/2019 at 11:30 am to provider [facility] , who verbally acknowledged these results. IMPRESSION: 1. Multiple peripherally enhancing parenchymal lesions which are predominantly juxtacortical/subcortical in location with surrounding edema, new as compared to MRI 05/25/2019. A dominant 2 cm lesion within the anterior left frontal lobe has prominent surrounding edema with associated mass effect and resultant early rightward subfalcine herniation. 2. New 0.6 cm peripherally enhancing lesion with surrounding edema within the left thalamus also involving portions of the posterior limb of left internal capsule and extending inferiorly toward the midbrain. 3. Multiple additional new juxtacortical/subcortical T2 hyperintense lesions, some of which demonstrate associated curvilinear enhancement. 4. The constellation of findings is nonspecific and favored differential considerations include tumefactive demyelination, atypical infection or metastatic disease. Electronically Signed: By: Jackey Loge On: 08/30/2019 11:31   Mr Cervical Spine W Or Wo Contrast  Result Date: 08/31/2019 CLINICAL DATA:  Multiple sclerosis.  Abnormal MRI of the brain. EXAM: MRI CERVICAL SPINE WITHOUT AND WITH CONTRAST TECHNIQUE: Multiplanar and multiecho pulse sequences of the cervical spine, to include the craniocervical junction and cervicothoracic junction, were obtained without and with intravenous contrast. CONTRAST:  57mL GADAVIST GADOBUTROL 1 MMOL/ML IV SOLN COMPARISON:  None. FINDINGS: Alignment: No significant listhesis or curvature is present. Vertebrae: Vertebral body heights and marrow signal are normal. Cord: Normal signal is present the cervical and upper thoracic  spinal cord to the lowest imaged level, T2-3. Posterior Fossa, vertebral arteries, paraspinal tissues: Craniocervical junction is normal. Disc levels: No significant focal disc protrusion or stenosis is present. Central canal and foramina are patent throughout the cervical spine. IMPRESSION: 1. Normal MRI appearance of the cervical spine without and with contrast. 2. Normal appearance the spinal cord. No mass lesion or evidence for demyelination. 3. No evidence for metastatic disease. Electronically Signed   By: Marin Roberts M.D.   On: 08/31/2019 11:51   Ct Abdomen Pelvis W Contrast  Result Date: 08/30/2019 CLINICAL DATA:  Brain metastasis? Follow-up for lesions of brain seen on MRI this morning. Numbness in face and head. EXAM: CT CHEST, ABDOMEN, AND PELVIS WITH CONTRAST TECHNIQUE: Multidetector CT imaging of the chest, abdomen and pelvis was performed following the standard protocol during bolus administration of intravenous contrast. CONTRAST:  OMNIPAQUE IOHEXOL 300 MG/ML  SOLN COMPARISON:  None. FINDINGS: CT CHEST FINDINGS Cardiovascular: Heart size is normal. No pericardial effusion. No thoracic aortic aneurysm. Mediastinum/Nodes: Single borderline pathologic by size criteria lymph node in the anterior mediastinum, with short axis measurement of  11 mm. No definitively enlarged or morphologically abnormal lymph nodes identified within the mediastinum or perihilar regions. Esophagus appears normal. Trachea and central bronchi are unremarkable. Lungs/Pleura: Lungs are clear. No pleural effusions. Musculoskeletal: No acute or suspicious osseous finding within the chest. Numerous small and mildly prominent lymph nodes in the bilateral axillary regions, including several mildly prominent retropectoral lymph nodes bilaterally, suspicious by number CT ABDOMEN PELVIS FINDINGS Hepatobiliary: No focal liver abnormality is seen. No gallstones, gallbladder wall thickening, or biliary dilatation. Pancreas:  Unremarkable. No pancreatic ductal dilatation or surrounding inflammatory changes. Spleen: Normal in size without focal abnormality. Adrenals/Urinary Tract: Adrenal glands appear normal. Kidneys are unremarkable without mass, stone or hydronephrosis. Bladder is unremarkable. Stomach/Bowel: Stomach is within normal limits. Appendix appears normal. No evidence of bowel wall thickening, distention, or inflammatory changes. Appendix is normal. Stomach is unremarkable, partially decompressed. Vascular/Lymphatic: No vascular abnormality identified. Scattered small and mildly prominent lymph nodes within the retroperitoneum and iliac chain regions bilaterally, and within the inguinal regions bilaterally, none of which are definitively pathologic by CT size criteria. Reproductive: Bilateral tubal ligation clips. Other: No free fluid or abscess collection. No soft tissue mass identified within the abdomen or pelvis. Nodular thickening deep to the umbilicus, presumably postsurgical change related to the tubal ligation. Musculoskeletal: No acute or suspicious osseous finding. IMPRESSION: 1. No acute findings within the abdomen or pelvis. No evidence of primary malignancy or metastatic disease within the chest, abdomen or pelvis. 2. Single borderline pathologic by size criteria lymph node in the anterior mediastinum, with short axis measurement of 11 mm. No definitively enlarged or morphologically abnormal lymph nodes identified within the mediastinum or perihilar regions. 3. Numerous small and mildly prominent lymph nodes within the bilateral axillary regions, including several mildly prominent retropectoral lymph nodes bilaterally, none of which are definitively pathologic by CT size criteria but these are suspicious by number. 4. Scattered small and mildly prominent lymph nodes within the retroperitoneum and iliac chain regions bilaterally, and within the inguinal regions bilaterally, again none of which are definitively  pathologic by CT size criteria. 5. Nodular thickening deep to the umbilicus, presumably postsurgical change related to the tubal ligation. Recommend correlation with surgical history. Electronically Signed   By: Bary RichardStan  Maynard M.D.   On: 08/30/2019 13:36     CBC Recent Labs  Lab 08/30/19 0834 08/30/19 1524 08/31/19 0415  WBC 4.2 5.6 4.1  HGB 12.9 15.4 12.7  HCT 38.0 46.0 37.7  PLT 262 277 266  MCV 77.2* 79 77.3*  MCH 26.2 26.3* 26.0  MCHC 33.9 33.5 33.7  RDW 14.5 14.6 14.5  LYMPHSABS 1.9 2.2  --   MONOABS 0.5  --   --   EOSABS 0.3 0.2  --   BASOSABS 0.0 0.0  --     Chemistries  Recent Labs  Lab 08/30/19 0834 08/31/19 0415 09/01/19 0442  NA 134* 130* 133*  K 3.3* 3.4* 3.3*  CL 99 97* 99  CO2 25 26 24   GLUCOSE 97 95 104*  BUN 12 10 15   CREATININE 0.50 0.55 0.56  CALCIUM 8.9 8.8* 9.1  MG  --  1.9  --    ------------------------------------------------------------------------------------------------------------------ estimated creatinine clearance is 135.5 mL/min (by C-G formula based on SCr of 0.56 mg/dL). ------------------------------------------------------------------------------------------------------------------ No results for input(s): HGBA1C in the last 72 hours. ------------------------------------------------------------------------------------------------------------------ No results for input(s): CHOL, HDL, LDLCALC, TRIG, CHOLHDL, LDLDIRECT in the last 72 hours. ------------------------------------------------------------------------------------------------------------------ No results for input(s): TSH, T4TOTAL, T3FREE, THYROIDAB in the last 72 hours.  Invalid  input(s): FREET3 ------------------------------------------------------------------------------------------------------------------ No results for input(s): VITAMINB12, FOLATE, FERRITIN, TIBC, IRON, RETICCTPCT in the last 72 hours.  Coagulation profile No results for input(s): INR, PROTIME in the  last 168 hours.  No results for input(s): DDIMER in the last 72 hours.  Cardiac Enzymes No results for input(s): CKMB, TROPONINI, MYOGLOBIN in the last 168 hours.  Invalid input(s): CK ------------------------------------------------------------------------------------------------------------------ Invalid input(s): POCBNP    Assessment & Plan  *Right facial numbness with MRI changes raising concern for multiple sclerosis or complication from HIV.  MRI of the cervical spine is negative  Patient has been seen by infectious disease/neuro/ neuro surgery + toxoplasmosis titers and blood I discussed with ID plan is to treat her LP currently on hold due to some concern for herniation  * CT scan of the chest and abdomen/pelvis showed diffuse lymphadenopathy.  Likely due to HIV Neurology has consulted hematology  *HIV.  Never treated.   viral load and CD4 count pending.  Wait for further input from infectious disease.  *Hypokalemia replace potassium recheck in the morning       Code Status Orders  (From admission, onward)         Start     Ordered   08/30/19 1351  Full code  Continuous     08/30/19 1351        Code Status History    This patient has a current code status but no historical code status.   Advance Care Planning Activity           Consults neurology hematology   DVT Prophylaxis  Lovenox  Lab Results  Component Value Date   PLT 266 08/31/2019     Time Spent in minutes 35 minutes  Greater than 50% of time spent in care coordination and counseling patient regarding the condition and plan of care.   Auburn Bilberry M.D on 09/01/2019 at 3:03 PM  Between 7am to 6pm - Pager - [phone]  After 6pm go to [email or web address] - Social research officer, government  [employer]   Office  [phone]

## 2019-09-02 LAB — FUNGITELL, SERUM: Fungitell Result: <31 pg/mL (ref <80)

## 2019-09-02 LAB — QUANTIFERON-TB GOLD PLUS: QuantiFERON-TB Gold Plus: NEGATIVE

## 2019-09-02 LAB — QUANTIFERON-TB GOLD PLUS (RQFGPL)
QuantiFERON Mitogen Value: 3.18 IU/mL
QuantiFERON Nil Value: 0.05 IU/mL
QuantiFERON TB1 Ag Value: 0.07 IU/mL
QuantiFERON TB2 Ag Value: 0.05 IU/mL

## 2019-09-02 LAB — GLUCOSE 6 PHOSPHATE DEHYDROGENASE
G6PDH: 8.5 U/g{Hb} (ref 4.7–14.6)
Hemoglobin: 13.8 g/dL (ref 11.1–15.9)

## 2019-09-02 LAB — CMV DNA, QUANTITATIVE, PCR
CMV DNA Quant: POSITIVE IU/mL
Log10 CMV Qn DNA Pl: UNDETERMINED log10 IU/mL

## 2019-09-02 LAB — IGG, IGA, IGM
IgA: 473 mg/dL — ABNORMAL HIGH (ref 87–352)
IgG (Immunoglobin G), Serum: 3134 mg/dL — ABNORMAL HIGH (ref 586–1602)
IgM (Immunoglobulin M), Srm: 206 mg/dL (ref 26–217)

## 2019-09-02 LAB — EPSTEIN BARR VRS(EBV DNA BY PCR)
EBV DNA QN by PCR: NEGATIVE copies/mL
log10 EBV DNA Qn PCR: UNDETERMINED log10 copy/mL

## 2019-09-02 LAB — GENOSURE PRIME (GSPRIL): HIV GenoSure PRIme(SM) PDF: UNDETERMINED

## 2019-09-02 NOTE — Progress Notes (Signed)
[facility] Hematology/Oncology Progress Note  Date of admission: 08/30/2019  Hospital day:  09/02/2019  Chief Complaint: Penny Young is a 33 y.o. female with HIV who was admitted through the emergency room with right facial numbness and an abnormal head MRI.  Subjective:  She feels fine.  She denies any facial numbness or altered taste.  She denies any headache.  Social History: The patient is alone today.  Allergies:  Allergies  Allergen Reactions  . Aspirin Itching    Scheduled Medications: . amLODipine  5 mg Oral Daily  . enoxaparin (LOVENOX) injection  40 mg Subcutaneous Q12H  . nicotine  21 mg Transdermal Daily    Review of Systems: GENERAL:  Feels good.  No fevers, sweats or weight loss. PERFORMANCE STATUS (ECOG):  0 HEENT:  No visual changes, runny nose, sore throat, mouth sores or tenderness. Lungs: No shortness of breath or cough.  No hemoptysis. Cardiac:  No chest pain, palpitations, orthopnea, or PND. GI:  No nausea, vomiting, diarrhea, constipation, melena or hematochezia. GU:  Voiding less often after discontinuation of diuretic.  No urgency, frequency, dysuria, or hematuria. Musculoskeletal:  No back pain.  No joint pain.  No muscle tenderness. Extremities:  No pain or swelling. Skin:  No rashes or skin changes. Neuro:  No headache, numbness or weakness, balance or coordination issues. Endocrine:  No diabetes, thyroid issues, hot flashes or night sweats. Psych:  No mood changes, depression or anxiety. Pain:  No focal pain. Review of systems:  All other systems reviewed and found to be negative.  Physical Exam: Blood pressure (!) 128/93, pulse 75, temperature 98.7 F (37.1 C), temperature source Oral, resp. rate 16, height 5\' 7"  (1.702 m), weight 268 lb 15.4 oz (122 kg), last menstrual period 08/30/2019, SpO2 99 %.  GENERAL:  Well developed, well nourished, woman sitting comfortably on the medical unit eating dinner in no acute  distress. MENTAL STATUS:  Alert and oriented to person, place and time. HEAD:  Brown hair in braids.  Normocephalic, atraumatic, face symmetric, no Cushingoid features. EYES:  [place].  Pulls equal round reactive to light and accommodation.  No conjunctivitis or scleral icterus. RESPIRATORY:  Clear to auscultation without rales, wheezes or rhonchi. CARDIOVASCULAR:  Regular rate and rhythm without murmur, rub or gallop. ABDOMEN:  Soft, non-tender, with active bowel sounds, and no appreciable hepatosplenomegaly.  No masses. SKIN:  No rashes, ulcers or lesions. EXTREMITIES: No edema, no skin discoloration or tenderness.  No palpable cords. NEUROLOGICAL: Unremarkable.  No deficit. PSYCH:  Appropriate.   Results for orders placed or performed during the hospital encounter of 08/30/19 (from the past 48 hour(s))  Basic metabolic panel     Status: Abnormal   Collection Time: 09/01/19  4:42 AM  Result Value Ref Range   Sodium 133 (L) 135 - 145 mmol/L   Potassium 3.3 (L) 3.5 - 5.1 mmol/L   Chloride 99 98 - 111 mmol/L   CO2 24 22 - 32 mmol/L   Glucose, Bld 104 (H) 70 - 99 mg/dL   BUN 15 6 - 20 mg/dL   Creatinine, Ser 0.56 0.44 - 1.00 mg/dL   Calcium 9.1 8.9 - 10.3 mg/dL   GFR calc non Af Amer >60 >60 mL/min   GFR calc Af Amer >60 >60 mL/min   Anion gap 10 5 - 15    Comment: Performed at [facility], [address] [place], [place] [address]  Hepatitis B core antibody, total     Status: Abnormal  Collection Time: 09/01/19  4:42 AM  Result Value Ref Range   Hep B Core Total Ab Reactive (A) NON REACTIVE    Comment: Performed at [facility], [address], [place], [place] [address]  Hepatitis B surface antigen     Status: None   Collection Time: 09/01/19  4:42 AM  Result Value Ref Range   Hepatitis B Surface Ag NON REACTIVE NON REACTIVE    Comment: Performed at [facility], [address], [place], [place] [address]  Hepatitis C antibody     Status: None    Collection Time: 09/01/19  4:42 AM  Result Value Ref Range   HCV Ab NON REACTIVE NON REACTIVE    Comment: (NOTE) Nonreactive HCV antibody screen is consistent with no HCV infections,  unless recent infection is suspected or other evidence exists to indicate HCV infection. Performed at [facility], [address] [place], [place] [address]   Lactate dehydrogenase     Status: None   Collection Time: 09/01/19  4:42 AM  Result Value Ref Range   LDH 167 98 - 192 U/L    Comment: Performed at [facility], [address] [place], [place] [address]  Uric acid     Status: None   Collection Time: 09/01/19  4:42 AM  Result Value Ref Range   Uric Acid, Serum 3.9 2.5 - 7.1 mg/dL    Comment: Performed at [facility], [address] [place], [place] [address]  IgG, IgA, IgM     Status: Abnormal   Collection Time: 09/01/19  4:42 AM  Result Value Ref Range   IgG (Immunoglobin G), Serum 3,134 (H) 586 - 1,602 mg/dL   IgA 250 (H) 87 - 037 mg/dL   IgM (Immunoglobulin M), Srm 206 26 - 217 mg/dL    Comment: (NOTE) Performed At: [facility] [address] [place], [place] [id number] Jolene Schimke MD [id number]   Glucose 6 phosphate dehydrogenase     Status: None   Collection Time: 09/01/19  4:42 AM  Result Value Ref Range   Hemoglobin 13.8 11.1 - 15.9 g/dL   K9ZPH 8.5 4.7 - 15.0 U/g Hb    Comment: (NOTE) When decreased, G-6-PD, Quant. values are associated with acute hemolytic anemia when deficient individuals are exposed to oxidative stress, such as with certain medications (e.g., primaquine), infection, or ingestion of fava beans. Caution: In patients with acute hemolysis (e.g., abnormally low RBC values), testing for G-6-PD may be falsely normal because older erythrocytes with a higher enzyme deficiency have been hemolyzed. Young erythrocytes and reticulocytes have normal or near-normal enzyme activity. Normal values of G-6-PD may be measured  for several weeks following a hemolytic event. Performed At: [facility] [address] [place], [place] [id number] Jolene Schimke MD [id number]    No results found.  Assessment:  Penny Young is a 33 y.o. female with HIV and multiple peripherally enhancing CNS parenchymal lesions predominantly juxtacortical/subcortical in location with surrounding edema.  Differential diagnosis includes tumefactive demyelination, atypical infection (toxoplasmosis), metastatic disease, PML, or CNS lymphoma.  Chest, abdomen and pelvic CT reveals no evidence of metastatic disease.  The largest node is 1.1 cm in the anterior mediastinum.  She has tiny non-pathologic size lymph nodes.  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  Toxoplasmosis IgG ratio > 400   Patient has a history of hepatitis B.  Hepatitis B surface antigen was negative.  Hepatitis B core antibody total positive.  Hepatitis C antibody negative.  Symptomatically, she denies any complaint.  Exam is unremarkable.  Plan:   1.   Multiple CNS lesions             Patient asymptomatic.  Toxoplasmosis titers high.  Patient currently being treated with Septra IV.  Decision was made to postpone LP.  Patient confirms plan to repeat head MRI in 2 weeks.  Steroids on hold.  Possible discharge per patient tomorrow depending on outpatient medications.  Discuss plan to follow-up as needed. 2.  HIV  HIV1 RNA 1,180,000 copies/ml.  CD4 count 370.  Plan for HAART after 2 weeks of anti-toxoplasmosis treatment per ID.   Rosey BathMelissa C , MD  09/02/2019, 10:23 PM

## 2019-09-02 NOTE — Progress Notes (Signed)
10/08 - No major neurological events - Patient resting comfortably in recliner, states that she feels much better, her right face tingling/numbness has resolved as well as her taste - Discussed the case with ID: high toxo titers. Patient on Bactrim for now. She will be started onsulfasalazine+ pyrimethamine ( 200mg  loading dose and then 75mg  every day) + leucovorin 25mg  every day. Will hold on steroids.  plan to repeat MRI w/wo in 2 weeks to gauge on evolution  Neuro exam remains stable and has improved since admission Alert, awake, oriented x4, speech is nle, follows commands PERLA, EOMI, no nystagmus, VFF, face symmetrical, face sensation to pinprick and touch is nle,uvula/tongue midline No motor deficit appreciated No sensory deficit appreciated No coordination deficit appreciated DTR and gait not cheked at time of exam   RECS: - Continue neuro protective measures while admitted including normothermia, normoglycemia, correct electrolytes/metabolic abnliites, treat infection - Appreciate neurosurgery recs - Appreciate heme-onc recs - Appreciate ID consult     10/07: - No major neurological event overnight - this Am, patient states that her right face tingling/numbness has resolved. Her taste is back to baseline.  Neuro exam remains stable and has improved  Alert, awake, oriented x4, speech is nle, follows commands PERLA, EOMI, no nystagmus, VFF, face symmetrical, face sensation to pinprick and touch is nle,uvula/tongue midline No motor deficit appreciated No sensory deficit appreciated No coordination deficit appreciated DTR and gait not cheked at time of exam   RECS: - Continue neuro protective measures while admitted including normothermia, normoglycemia, correct electrolytes/metabolic abnliites, treat infection - Appreciate neurosurgery recs - Appreciate heme-onc recs - Appreciate ID consult - for now hold on steroids until exploration done or  worsenig     10/06 Events overnight noted. No major neuro changes overnight. Patient states that tingling/numbness of right face is getting better as well as her taste.  CT chest/abdome/pelvis:  No acute findings within the abdomen or pelvis. No evidence of primary malignancy or metastatic disease within the chest, abdomen or pelvis.Single borderline pathologic by size criteria lymph node in the anterior mediastinum, with short axis measurement of 11 mm. No definitively enlarged or morphologically abnormal lymph nodes identified within the mediastinum or perihilar regions.Numerous small and mildly prominent lymph nodes within thebilateral axillary regions, including several mildly prominentretropectoral lymph nodes bilaterally, none of which are definitively pathologic by CT size criteria but these are suspicious by number.Scattered small and mildly prominent lymph nodes within the retroperitoneum and iliac chain regions bilaterally, and within the inguinal regions bilaterally, again none of which are definitively pathologic by CT size criteria.Nodular thickening deep to the umbilicus, presumably postsurgicalchange related to the tubal ligation. Recommend correlation withsurgical history.  Labs per EMR  Neuro exam remains unchanged and stable Alert, awake, oriented x4, speech is nle, follows commands PERLA, EOMI, no nystagmus, VFF, right naso labial effacement, decrease sensation to pinprick and touch on the right face, uvula/tongue midline No motor deficit appreciated No sensory deficit appreciated No coordination deficit appreciated DTR and gait not cheked at time of exam  Recs:  - Continue neuro protective measures while admitted including normothermia, normoglycemia, correct electrolytes/metabolic abnliites, treat infection - I spoke with heme onc regarding findings of CT Abdomen/chest/pelvis. They will see her today. Appreciate their input - Appreciate ID consult -  neurosurgery consult if biopsy indicated - for now hold on steroids until exploration done or worsenig     10/05: Admission day  HPI: Penny Young is an 33 y.o. female  Being admitted with  right face numbness/tingling  She is a 33 y/o with h/o of HTN who presents to ER with right face numbness,. Patient states that she started having numbness in her lower, that spread to he right face. She also complains of tingling and lost of taste. She denies HA, visual disturbances/eye pain, denies nausea/vting, denies fever/cough, denies night sweat/weight loss. She denies any weakness. She admits for smoking a pack/week for "long time". She also states that 18 years ago she was tested positive for HIV and was not started on treatment b/c low viral load. She has a twin sister that has MS. There is no worsening of symptomatology. MRI obtained in ER: Multiple peripherally enhancing parenchymal lesions which arepredominantly juxtacortical/subcortical in location with surrounding edema, new as compared to MRI 05/25/2019. A dominant 2 cm lesion within the anterior left frontal lobe has prominent surrounding edema with associated mass effect and resultant early rightward subfalcine herniation. New 0.6 cm peripherally enhancing lesion with surrounding edema within the left thalamus also involving portions of the posterior limb of left internal capsule and extending inferiorly toward the midbrain.Multiple additional new juxtacortical/subcortical T2 hyperintense lesions, some of which demonstrate associated curvilinear enhancement. The constellation of findings is nonspecific and favoreddifferential considerations include tumefactive demyelination, atypical infection or metastatic disease.  Previous MRI on 6/30: Scattered small subcortical white matter hyperintensities Bilaterally for what seems to be a right arm numbness with difficulty holding objects ( notes on 6/30).   Past Medical History:  Diagnosis Date   . Asthma   . Bipolar 1 disorder (Penny Young)   . Drug abuse (Penny Young)   . Hepatitis B   . HIV (human immunodeficiency virus infection) (Penny Young)   . Hypertension   . Obese   . Restless leg syndrome   . Schizophrenia [facility])     Past Surgical History:  Procedure Laterality Date  . HERNIA REPAIR    . TUBAL LIGATION    . TYMPANOSTOMY TUBE PLACEMENT      Family History  Problem Relation Age of Onset  . Hypertension Other     Social History:  reports that she has been smoking cigarettes. She has been smoking about 0.10 packs per day. She has never used smokeless tobacco. She reports current alcohol use. She reports previous drug use. Drug: Marijuana.  Allergies  Allergen Reactions  . Aspirin Itching    Medications: I have reviewed the patient's current medications.  ROS: As per HPI Physical Examination: Blood pressure 110/71, pulse 79, temperature 98.5 F (36.9 C), temperature source Oral, resp. rate 15, height 5\' 7"  (1.702 m), weight 122 kg, last menstrual period 08/30/2019, SpO2 99 %.  Neurologic Examination Alert, awake, oriented x4, speech is nle, follows commands PERLA, EOMI, no nystagmus, VFF, right naso labial effacement, decrease sensation to pinprick and touch on the right face, uvula/tongue midline No motor deficit appreciated No sensory deficit appreciated No coordination deficit appreciated DTR and gait not cheked at time of exam   Results for orders placed or performed during the hospital encounter of 08/30/19 (from the past 48 hour(s))  Basic metabolic panel     Status: Abnormal   Collection Time: 09/01/19  4:42 AM  Result Value Ref Range   Sodium 133 (L) 135 - 145 mmol/L   Potassium 3.3 (L) 3.5 - 5.1 mmol/L   Chloride 99 98 - 111 mmol/L   CO2 24 22 - 32 mmol/L   Glucose, Bld 104 (H) 70 - 99 mg/dL   BUN 15 6 - 20 mg/dL   Creatinine,  Ser 0.56 0.44 - 1.00 mg/dL   Calcium 9.1 8.9 - 19.6 mg/dL   GFR calc non Af Amer >60 >60 mL/min   GFR calc Af Amer >60 >60 mL/min    Anion gap 10 5 - 15    Comment: Performed at [facility], [address] [place], [place] [address]  Hepatitis B core antibody, total     Status: Abnormal   Collection Time: 09/01/19  4:42 AM  Result Value Ref Range   Hep B Core Total Ab Reactive (A) NON REACTIVE    Comment: Performed at [facility], [address], [place], [place] [address]  Hepatitis B surface antigen     Status: None   Collection Time: 09/01/19  4:42 AM  Result Value Ref Range   Hepatitis B Surface Ag NON REACTIVE NON REACTIVE    Comment: Performed at [facility], [address], [place], [place] [address]  Hepatitis C antibody     Status: None   Collection Time: 09/01/19  4:42 AM  Result Value Ref Range   HCV Ab NON REACTIVE NON REACTIVE    Comment: (NOTE) Nonreactive HCV antibody screen is consistent with no HCV infections,  unless recent infection is suspected or other evidence exists to indicate HCV infection. Performed at [facility], [address] [place], [place] [address]   Lactate dehydrogenase     Status: None   Collection Time: 09/01/19  4:42 AM  Result Value Ref Range   LDH 167 98 - 192 U/L    Comment: Performed at [facility], [address] [place], [place] [address]  Uric acid     Status: None   Collection Time: 09/01/19  4:42 AM  Result Value Ref Range   Uric Acid, Serum 3.9 2.5 - 7.1 mg/dL    Comment: Performed at [facility], [address] [place], [place] [address]  IgG, IgA, IgM     Status: Abnormal   Collection Time: 09/01/19  4:42 AM  Result Value Ref Range   IgG (Immunoglobin G), Serum 3,134 (H) 586 - 1,602 mg/dL   IgA 702 (H) 87 - 637 mg/dL   IgM (Immunoglobulin M), Srm 206 26 - 217 mg/dL    Comment: (NOTE) Performed At: [facility] [address] [place], [place] [id number] Jolene Schimke MD [id number]     Recent Results (from the past 240 hour(s))  SARS CORONAVIRUS 2 (TAT 6-24 HRS) Nasopharyngeal  Nasopharyngeal Swab     Status: None   Collection Time: 08/30/19  1:32 PM   Specimen: Nasopharyngeal Swab  Result Value Ref Range Status   SARS Coronavirus 2 NEGATIVE NEGATIVE Final    Comment: (NOTE) SARS-CoV-2 target nucleic acids are NOT DETECTED. The SARS-CoV-2 RNA is generally detectable in upper and lower respiratory specimens during the acute phase of infection. Negative results do not preclude SARS-CoV-2 infection, do not rule out co-infections with other pathogens, and should not be used as the sole basis for treatment or other patient management decisions. Negative results must be combined with clinical observations, patient history, and epidemiological information. The expected result is Negative. Fact Sheet for Patients: HairSlick.no Fact Sheet for Healthcare Providers: quierodirigir.com This test is not yet approved or cleared by the [place] FDA and  has been authorized for detection and/or diagnosis of SARS-CoV-2 by FDA under an Emergency Use Authorization (EUA). This EUA will remain  in effect (meaning this test can be used) for the duration of the  COVID-19 declaration under Section 56 4(b)(1) of the Act, 21 U.S.C. section 360bbb-3(b)(1), unless the authorization is terminated or revoked sooner. Performed at [facility], [address] [place], [place] [address]     Mr Cervical Spine W Or Wo Contrast  Result Date: 08/31/2019 CLINICAL DATA:  Multiple sclerosis.  Abnormal MRI of the brain. EXAM: MRI CERVICAL SPINE WITHOUT AND WITH CONTRAST TECHNIQUE: Multiplanar and multiecho pulse sequences of the cervical spine, to include the craniocervical junction and cervicothoracic junction, were obtained without and with intravenous contrast. CONTRAST:  68mL GADAVIST GADOBUTROL 1 MMOL/ML IV SOLN COMPARISON:  None. FINDINGS: Alignment: No significant listhesis or curvature is present. Vertebrae: Vertebral body  heights and marrow signal are normal. Cord: Normal signal is present the cervical and upper thoracic spinal cord to the lowest imaged level, T2-3. Posterior Fossa, vertebral arteries, paraspinal tissues: Craniocervical junction is normal. Disc levels: No significant focal disc protrusion or stenosis is present. Central canal and foramina are patent throughout the cervical spine. IMPRESSION: 1. Normal MRI appearance of the cervical spine without and with contrast. 2. Normal appearance the spinal cord. No mass lesion or evidence for demyelination. 3. No evidence for metastatic disease. Electronically Signed   By: Marin Roberts M.D.   On: 08/31/2019 11:51     Assessment/Plan: She is a 33 y/o with h/o of HTN who presents to ER with right face numbness/tingling for a week duration. Neuro exam with right facial/decrease sensation to pinprick and touch on the right face. MRI concerning for atypical infection vs mets vs MD vs primary brain lesion ( lymphoma??)  RECS: - Neuro protective measures including normothermia, normoglycemia, correct electrolytes/metabolic abnliites, treat infection - Obtain MRI w/wo of cervical spine - Obtain CT chest abdomen and pelvis wwo contrast - HIV panel - Consider ID consult - Consider neurosurgery consult if biopsy indicated - for now hold on steroids until exploration done or worsenig 09/02/2019, 8:25 AM

## 2019-09-02 NOTE — Progress Notes (Signed)
Date of Admission:  08/30/2019        Subjective: Says she is feeling better Wants to go home No headache No cough or sob No fever No numbness rt side of face  Medications:  . amLODipine  5 mg Oral Daily  . enoxaparin (LOVENOX) injection  40 mg Subcutaneous Q12H  . nicotine  21 mg Transdermal Daily    Objective: Vital signs in last 24 hours: Temp:  [98.5 F (36.9 C)-99.3 F (37.4 C)] 99.3 F (37.4 C) (10/08 1553) Pulse Rate:  [79-100] 95 (10/08 1553) Resp:  [14-22] 22 (10/08 1553) BP: (110-143)/(71-93) 118/77 (10/08 1553) SpO2:  [99 %-100 %] 100 % (10/08 1553)  PHYSICAL EXAM:  General: Alert, cooperative, no distress, appears stated age.  Head: Normocephalic, without obvious abnormality, atraumatic. Eyes: Conjunctivae clear, anicteric sclerae. Pupils are equal ENT Nares normal. No drainage or sinus tenderness. Lips, mucosa, and tongue normal. No Thrush Neck: Supple, symmetrical, no adenopathy, thyroid: non tender no carotid bruit and no JVD. Back: No CVA tenderness. Lungs: Clear to auscultation bilaterally. No Wheezing or Rhonchi. No rales. Heart: Regular rate and rhythm, no murmur, rub or gallop. Abdomen: Soft, non-tender,not distended. Bowel sounds normal. No masses Extremities: atraumatic, no cyanosis. No edema. No clubbing Skin: No rashes or lesions. Or bruising Lymph: Cervical, supraclavicular normal. Neurologic: Grossly non-focal  Lab Results Recent Labs    08/31/19 0415 09/01/19 0442  WBC 4.1  --   HGB 12.7 13.8  HCT 37.7  --   NA 130* 133*  K 3.4* 3.3*  CL 97* 99  CO2 26 24  BUN 10 15  CREATININE 0.55 0.56   Liver Panel No results for input(s): PROT, ALBUMIN, AST, ALT, ALKPHOS, BILITOT, BILIDIR, IBILI in the last 72 hours. Sedimentation Rate No results for input(s): ESRSEDRATE in the last 72 hours. C-Reactive Protein No results for input(s): CRP in the last 72 hours.  Microbiology:  Studies/Results: No results  found.   Assessment/Plan: HIV- on no HAARt since 2010. Cd4 370 with 16%. 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 She will need  Po sulfasalazine+ pyrimethamine ( 200mg  loading dose and then 75mg  every day) + leucovorin 25mg  every day Until we get these medicines ( is being procured from a compounding pharmacy) she will be on   trimethoprim+ sulfamethoxazole 5mg /Kg Q 12. Currently on IV started yesterday She is insistent  on going home, so will send her tomorrow with PO bactrim DS 7 1/2 tablets a day- can be given as 2-2-3 . Once we have the other treatment she will come and get it .  After 2 weeks of treatment we can repeat MRI to see whether there is improvement in the lesions. D.D lymphoma, PML,  Will not start steroids now  She will also enrol at [facility] for [facility] ( as she no insurace) and treatment- she will not start HAART until after 2 weeks of Antitoxo treatment and follow up MRI shows improvement as risk for paradxical worsneing is very high if HAART is started now  Discussed the management plan with the patient- explained to her that if her symptoms recur/get worse or she  has new symptoms like seizures she will have to report immediately to ED we will have to image her brain and then may need biopsy.  Discussed the management with hospitalist

## 2019-09-02 NOTE — Progress Notes (Signed)
[place] [facility] [place] NAME: Penny Young    MR#:  [id number]  DATE OF BIRTH:  [birth year]  SUBJECTIVE:   Patient denies any headache, worsening numbness today.  Overall feels much better.  No other acute events overnight.  Remains on high-dose IV Bactrim for suspected toxoplasmosis.  REVIEW OF SYSTEMS:    Review of Systems  Constitutional: Negative for chills and fever.  HENT: Negative for congestion and tinnitus.   Eyes: Negative for blurred vision and double vision.  Respiratory: Negative for cough, shortness of breath and wheezing.   Cardiovascular: Negative for chest pain, orthopnea and PND.  Gastrointestinal: Negative for abdominal pain, diarrhea, nausea and vomiting.  Genitourinary: Negative for dysuria and hematuria.  Neurological: Negative for dizziness, sensory change and focal weakness.  All other systems reviewed and are negative.   Nutrition: 2 gm sodium Tolerating Diet: Yes Tolerating PT: Ambulatory  DRUG ALLERGIES:   Allergies  Allergen Reactions  . Aspirin Itching    VITALS:  Blood pressure 124/87, pulse 97, temperature 98.5 F (36.9 C), temperature source Oral, resp. rate 18, height 5\' 7"  (1.702 m), weight 122 kg, last menstrual period 08/30/2019, SpO2 100 %.  PHYSICAL EXAMINATION:   Physical Exam  GENERAL:  33 y.o.-year-old patient sitting up in chair in no acute distress.  EYES: Pupils equal, round, reactive to light and accommodation. No scleral icterus. Extraocular muscles intact.  HEENT: Head atraumatic, normocephalic. Oropharynx and nasopharynx clear.  NECK:  Supple, no jugular venous distention. No thyroid enlargement, no tenderness.  LUNGS: Normal breath sounds bilaterally, no wheezing, rales, rhonchi. No use of accessory muscles of respiration.  CARDIOVASCULAR: S1, S2 normal. No murmurs, rubs, or gallops.  ABDOMEN: Soft, nontender, nondistended. Bowel sounds present. No organomegaly or mass.   EXTREMITIES: No cyanosis, clubbing or edema b/l.    NEUROLOGIC: Cranial nerves II through XII are intact. No focal Motor or sensory deficits b/l.   PSYCHIATRIC: The patient is alert and oriented x 3.  SKIN: No obvious rash, lesion, or ulcer.    LABORATORY PANEL:   CBC Recent Labs  Lab 08/31/19 0415 09/01/19 0442  WBC 4.1  --   HGB 12.7 13.8  HCT 37.7  --   PLT 266  --    ------------------------------------------------------------------------------------------------------------------  Chemistries  Recent Labs  Lab 08/31/19 0415 09/01/19 0442  NA 130* 133*  K 3.4* 3.3*  CL 97* 99  CO2 26 24  GLUCOSE 95 104*  BUN 10 15  CREATININE 0.55 0.56  CALCIUM 8.8* 9.1  MG 1.9  --    ------------------------------------------------------------------------------------------------------------------  Cardiac Enzymes No results for input(s): TROPONINI in the last 168 hours. ------------------------------------------------------------------------------------------------------------------  RADIOLOGY:  No results found.   ASSESSMENT AND PLAN:   33 year old female with past medical history of schizophrenia, restless leg syndrome, history of HIV, hepatitis B, bipolar disorder who presented to the hospital right facial numbness and had a abnormal MRI of the brain.  1.  Right facial numbness/tongue numbness- MRI on admission showing changes consistent with multiple demyelinating lesions questionable MS versus atypical infection versus disease. - Seen by neurology and neurosurgery and no plans on any surgical intervention.  Neurosurgery has recommended that the patient is stable for lumbar puncture if needed. - Given patient's history of HIV seen by infectious disease and patient's toxoplasmosis titer is significantly elevated. - Patient symptoms could be possibly to toxoplasmosis.  Seen by ID and started on high-dose IV Bactrim.  Patient clinically feels a lot better. -  will discuss  with ID regarding longevity of treatment and when we can switch over to Oral meds.   2.  History of HIV-patient has not been on retroviral therapy in the past. -As per ID patient's CD4 count is greater than 300.  Will initiate antiviral therapy once treatment of toxoplasmosis has been finished.  3.  Essential hypertension-continue Norvasc.  4.  Tobacco abuse-continue nicotine patch.    All the records are reviewed and case discussed with Care Management/Social Worker. Management plans discussed with the patient, family and they are in agreement.  CODE STATUS: Full code  DVT Prophylaxis: Lovenox  TOTAL TIME TAKING CARE OF THIS PATIENT: 30 minutes.   POSSIBLE D/C IN 2-3 DAYS, DEPENDING ON CLINICAL CONDITION.   Houston Siren M.D on 09/02/2019 at 2:30 PM  Between 7am to 6pm - Pager - [phone]  After 6pm go to [email or web address] - Social research officer, government  [facility]  Office  [phone]  CC: Primary care physician; System, Pcp Not In

## 2019-09-02 NOTE — Progress Notes (Signed)
   09/02/19 1000  Clinical Encounter Type  Visited With Patient  Visit Type Follow-up  Ch followed up. Pt said she was doing better and shared how she was glad that she now only takes one pill. Pt complained of the quality of the food yet other than that seemed to have found a peaceful place in mind. Ch provided prayer shawl to show support and encouragement. The visit was appreciated.

## 2019-09-03 DIAGNOSIS — B582 Toxoplasma meningoencephalitis: Secondary | ICD-10-CM

## 2019-09-03 LAB — MAGNESIUM: Magnesium: 1.9 mg/dL (ref 1.7–2.4)

## 2019-09-03 LAB — POTASSIUM: Potassium: 3.4 mmol/L — ABNORMAL LOW (ref 3.5–5.1)

## 2019-09-03 MED ORDER — POTASSIUM CHLORIDE CRYS ER 20 MEQ PO TBCR
20.0000 meq | EXTENDED_RELEASE_TABLET | Freq: Once | ORAL | Status: AC
  Administered 2019-09-03: 15:00:00 20 meq via ORAL
  Filled 2019-09-03: qty 1

## 2019-09-03 MED ORDER — SULFAMETHOXAZOLE-TRIMETHOPRIM 800-160 MG PO TABS: 2.5000 | ORAL_TABLET | Freq: Three times a day (TID) | ORAL | 0 refills | Status: AC

## 2019-09-03 NOTE — Progress Notes (Signed)
10/09 - No major neurological events - Patient resting comfortably in bed, states that she feels better better, right face tingling/numbness has resolved. Taste returned to nle. - Patient on Bactrim IV for now. Per ID notes she will switched to PO. She will be started onsulfasalazine+ pyrimethamine ( 200mg  loading dose and then 75mg  every day) + leucovorin 25mg  every day.  Will hold on steroids.   Plan to repeat MRI w/wo in 2 weeks to gauge on evolution Needs a very close f/up with ID and neurology as OTP  Neuro exam remains stable and improved since admission Alert, awake, oriented x4, speech is nle, follows commands PERLA, EOMI, no nystagmus, VFF, face symmetrical, face sensation to pinprick and touch is nle,uvula/tongue midline No motor deficit appreciated No sensory deficit appreciated No coordination deficit appreciated DTR and gait not cheked at time of exam   RECS: - Continue neuro protective measures while admitted including normothermia, normoglycemia, correct electrolytes/metabolic abnliites, treat infection - Appreciate neurosurgery recs - Appreciate heme-onc recs - Appreciate ID consult    10/08 - No major neurological events - Patient resting comfortably in recliner, states that she feels much better, her right face tingling/numbness has resolved as well as her taste - Discussed the case with ID: high toxo titers. Patient on Bactrim for now. She will be started onsulfasalazine+ pyrimethamine ( 200mg  loading dose and then 75mg  every day) + leucovorin 25mg  every day. Will hold on steroids.  plan to repeat MRI w/wo in 2 weeks to gauge on evolution  Neuro exam remains stable and has improved since admission Alert, awake, oriented x4, speech is nle, follows commands PERLA, EOMI, no nystagmus, VFF, face symmetrical, face sensation to pinprick and touch is nle,uvula/tongue midline No motor deficit appreciated No sensory deficit appreciated No coordination deficit  appreciated DTR and gait not cheked at time of exam   RECS: - Continue neuro protective measures while admitted including normothermia, normoglycemia, correct electrolytes/metabolic abnliites, treat infection - Appreciate neurosurgery recs - Appreciate heme-onc recs - Appreciate ID consult     10/07: - No major neurological event overnight - this Am, patient states that her right face tingling/numbness has resolved. Her taste is back to baseline.  Neuro exam remains stable and has improved  Alert, awake, oriented x4, speech is nle, follows commands PERLA, EOMI, no nystagmus, VFF, face symmetrical, face sensation to pinprick and touch is nle,uvula/tongue midline No motor deficit appreciated No sensory deficit appreciated No coordination deficit appreciated DTR and gait not cheked at time of exam   RECS: - Continue neuro protective measures while admitted including normothermia, normoglycemia, correct electrolytes/metabolic abnliites, treat infection - Appreciate neurosurgery recs - Appreciate heme-onc recs - Appreciate ID consult - for now hold on steroids until exploration done or worsenig     10/06 Events overnight noted. No major neuro changes overnight. Patient states that tingling/numbness of right face is getting better as well as her taste.  CT chest/abdome/pelvis:  No acute findings within the abdomen or pelvis. No evidence of primary malignancy or metastatic disease within the chest, abdomen or pelvis.Single borderline pathologic by size criteria lymph node in the anterior mediastinum, with short axis measurement of 11 mm. No definitively enlarged or morphologically abnormal lymph nodes identified within the mediastinum or perihilar regions.Numerous small and mildly prominent lymph nodes within thebilateral axillary regions, including several mildly prominentretropectoral lymph nodes bilaterally, none of which are definitively pathologic by CT size criteria  but these are suspicious by number.Scattered small and mildly prominent lymph nodes within  the retroperitoneum and iliac chain regions bilaterally, and within the inguinal regions bilaterally, again none of which are definitively pathologic by CT size criteria.Nodular thickening deep to the umbilicus, presumably postsurgicalchange related to the tubal ligation. Recommend correlation withsurgical history.  Labs per EMR  Neuro exam remains unchanged and stable Alert, awake, oriented x4, speech is nle, follows commands PERLA, EOMI, no nystagmus, VFF, right naso labial effacement, decrease sensation to pinprick and touch on the right face, uvula/tongue midline No motor deficit appreciated No sensory deficit appreciated No coordination deficit appreciated DTR and gait not cheked at time of exam  Recs:  - Continue neuro protective measures while admitted including normothermia, normoglycemia, correct electrolytes/metabolic abnliites, treat infection - I spoke with heme onc regarding findings of CT Abdomen/chest/pelvis. They will see her today. Appreciate their input - Appreciate ID consult - neurosurgery consult if biopsy indicated - for now hold on steroids until exploration done or worsenig     10/05: Admission day  HPI: Penny Young is an 33 y.o. female  Being admitted with right face numbness/tingling  She is a 33 y/o with h/o of HTN who presents to ER with right face numbness,. Patient states that she started having numbness in her lower, that spread to he right face. She also complains of tingling and lost of taste. She denies HA, visual disturbances/eye pain, denies nausea/vting, denies fever/cough, denies night sweat/weight loss. She denies any weakness. She admits for smoking a pack/week for "long time". She also states that 18 years ago she was tested positive for HIV and was not started on treatment b/c low viral load. She has a twin sister that has MS. There is no worsening of  symptomatology. MRI obtained in ER: Multiple peripherally enhancing parenchymal lesions which arepredominantly juxtacortical/subcortical in location with surrounding edema, new as compared to MRI 05/25/2019. A dominant 2 cm lesion within the anterior left frontal lobe has prominent surrounding edema with associated mass effect and resultant early rightward subfalcine herniation. New 0.6 cm peripherally enhancing lesion with surrounding edema within the left thalamus also involving portions of the posterior limb of left internal capsule and extending inferiorly toward the midbrain.Multiple additional new juxtacortical/subcortical T2 hyperintense lesions, some of which demonstrate associated curvilinear enhancement. The constellation of findings is nonspecific and favoreddifferential considerations include tumefactive demyelination, atypical infection or metastatic disease.  Previous MRI on 6/30: Scattered small subcortical white matter hyperintensities Bilaterally for what seems to be a right arm numbness with difficulty holding objects ( notes on 6/30).   Past Medical History:  Diagnosis Date  . Asthma   . Bipolar 1 disorder ([place])   . Drug abuse ([place])   . Hepatitis B   . HIV (human immunodeficiency virus infection) ([place])   . Hypertension   . Obese   . Restless leg syndrome   . Schizophrenia [facility])     Past Surgical History:  Procedure Laterality Date  . HERNIA REPAIR    . TUBAL LIGATION    . TYMPANOSTOMY TUBE PLACEMENT      Family History  Problem Relation Age of Onset  . Hypertension Other     Social History:  reports that she has been smoking cigarettes. She has been smoking about 0.10 packs per day. She has never used smokeless tobacco. She reports current alcohol use. She reports previous drug use. Drug: Marijuana.  Allergies  Allergen Reactions  . Aspirin Itching    Medications: I have reviewed the patient's current medications.  ROS: As per HPI Physical  Examination: Blood  pressure 113/81, pulse 77, temperature 98.2 F (36.8 C), temperature source Oral, resp. rate 16, height 5\' 7"  (1.702 m), weight 122 kg, last menstrual period 08/30/2019, SpO2 100 %.  Neurologic Examination Alert, awake, oriented x4, speech is nle, follows commands PERLA, EOMI, no nystagmus, VFF, right naso labial effacement, decrease sensation to pinprick and touch on the right face, uvula/tongue midline No motor deficit appreciated No sensory deficit appreciated No coordination deficit appreciated DTR and gait not cheked at time of exam   Results for orders placed or performed during the hospital encounter of 08/30/19 (from the past 48 hour(s))  Potassium     Status: Abnormal   Collection Time: 09/03/19  6:15 AM  Result Value Ref Range   Potassium 3.4 (L) 3.5 - 5.1 mmol/L    Comment: Performed at [facility], [address], [place], [place]  Magnesium     Status: None   Collection Time: 09/03/19  6:15 AM  Result Value Ref Range   Magnesium 1.9 1.7 - 2.4 mg/dL    Comment: Performed at [facility], [address] [place], [place]    Recent Results (from the past 240 hour(s))  SARS CORONAVIRUS 2 (TAT 6-24 HRS) Nasopharyngeal Nasopharyngeal Swab     Status: None   Collection Time: 08/30/19  1:32 PM   Specimen: Nasopharyngeal Swab  Result Value Ref Range Status   SARS Coronavirus 2 NEGATIVE NEGATIVE Final    Comment: (NOTE) SARS-CoV-2 target nucleic acids are NOT DETECTED. The SARS-CoV-2 RNA is generally detectable in upper and lower respiratory specimens during the acute phase of infection. Negative results do not preclude SARS-CoV-2 infection, do not rule out co-infections with other pathogens, and should not be used as the sole basis for treatment or other patient management decisions. Negative results must be combined with clinical observations, patient history, and epidemiological information. The expected result  is Negative. Fact Sheet for Patients: 10/30/19 Fact Sheet for Healthcare Providers: HairSlick.no This test is not yet approved or cleared by the quierodirigir.com FDA and  has been authorized for detection and/or diagnosis of SARS-CoV-2 by FDA under an Emergency Use Authorization (EUA). This EUA will remain  in effect (meaning this test can be used) for the duration of the COVID-19 declaration under Section 56 4(b)(1) of the Act, 21 U.S.C. section 360bbb-3(b)(1), unless the authorization is terminated or revoked sooner. Performed at [facility], [address] [place], [place]     No results found.   Assessment/Plan: She is a 33 y/o with h/o of HTN who presents to ER with right face numbness/tingling for a week duration. Neuro exam with right facial/decrease sensation to pinprick and touch on the right face. MRI concerning for atypical infection vs mets vs MD vs primary brain lesion ( lymphoma??)  RECS: - Neuro protective measures including normothermia, normoglycemia, correct electrolytes/metabolic abnliites, treat infection - Obtain MRI w/wo of cervical spine - Obtain CT chest abdomen and pelvis wwo contrast - HIV panel - Consider ID consult - Consider neurosurgery consult if biopsy indicated - for now hold on steroids until exploration done or worsenig 09/03/2019, 8:21 AM

## 2019-09-03 NOTE — Discharge Summary (Signed)
[facility] [place] [facility]   PATIENT NAME: Penny Young    MR#:  [id number]  DATE OF BIRTH:  1986-02-22  DATE OF ADMISSION:  08/30/2019 ADMITTING PHYSICIAN: Milagros Loll, MD  DATE OF DISCHARGE: 09/03/2019  PRIMARY CARE PHYSICIAN: System, Pcp Not In    ADMISSION DIAGNOSIS:  Human immunodeficiency virus (HIV) disease (HCC) [B20] Right facial numbness [R20.0] Brain lesion [G93.9]  DISCHARGE DIAGNOSIS:   Toxoplasmosis  SECONDARY DIAGNOSIS:   Past Medical History:  Diagnosis Date  . Asthma   . Bipolar 1 disorder (HCC)   . Drug abuse (HCC)   . Hepatitis B   . HIV (human immunodeficiency virus infection) (HCC)   . Hypertension   . Obese   . Restless leg syndrome   . Schizophrenia [facility])     HOSPITAL COURSE:   33 year old female with past medical history of schizophrenia, restless leg syndrome, history of HIV, hepatitis B, bipolar disorder who presented to the hospital right facial numbness and had a abnormal MRI of the brain.  1.  Right facial numbness/tongue numbness-  presented to the hospital with the symptoms as stated. -MRI on admission show changes consistent with multiple demyelinating lesions questionable MS versus atypical infection versus disease. -Given patient's history of HIV seen by infectious disease and patient had multiple titers drawn including fungal and also for toxoplasmosis which was significantly elevated.  Patient's MRI changes and her symptoms were related to suspected toxoplasmosis. -Neurology and neurosurgery were also consulted and as per them they agreed with the diagnosis. -At some point there was a concern of possibly doing a lumbar puncture but this was not done as her titer was positive and she was presumed to have toxoplasmosis and started on treatment. -First-line therapy for sepsis per methenamine, sulfadiazine and leucovorin combination but since patient had no insurance and drug was not available patient was started  on high-dose IV Bactrim. - At this point patient is being discharged on high-dose oral Bactrim she has clinically improved and she will follow-up at the HIV clinic.she gets her HMAP assistance with meds and she should be able to get her first line therapy for Toxo at the HIV Clinic for free  -She will be discharged on Bactrim high-dose 2-1/2 tabs 3 times a day with outpatient follow-up at the HIV clinic which should help her arrange for the first-line therapy as stated above.  She is in agreement with this plan.   2.  History of HIV-patient has not been on retroviral therapy in the past. -As per ID patient's CD4 count is greater than 300.  -Patient is to follow-up at the community clinic for HIV where she is going to be arranged for HMAP and assistance with her HIV meds.    3.  Essential hypertension- pt. Will continue Norvasc, HCTZ.  4.  Tobacco abuse- while in the hospital pt. Was on continue nicotine patch.  DISCHARGE CONDITIONS:   Stable.   CONSULTS OBTAINED:  Treatment Team:  Marita Snellen, MD Lucy Chris, MD  DRUG ALLERGIES:   Allergies  Allergen Reactions  . Aspirin Itching    DISCHARGE MEDICATIONS:   Allergies as of 09/03/2019      Reactions   Aspirin Itching      Medication List    TAKE these medications   amLODipine 5 MG tablet Commonly known as: NORVASC Take 1 tablet (5 mg total) by mouth daily.   hydrochlorothiazide 12.5 MG tablet Commonly known as: HYDRODIURIL Take 1 tablet (12.5 mg total) by mouth  daily.   sulfamethoxazole-trimethoprim 800-160 MG tablet Commonly known as: BACTRIM DS Take 2.5 tablets by mouth every 8 (eight) hours for 28 days.         DISCHARGE INSTRUCTIONS:   DIET:  Cardiac diet  DISCHARGE CONDITION:  Stable  ACTIVITY:  No driving for 3 weeks  OXYGEN:  Home Oxygen: No.   Oxygen Delivery: room air  DISCHARGE LOCATION:  home   If you experience worsening of your admission symptoms, develop shortness of breath,  life threatening emergency, suicidal or homicidal thoughts you must seek medical attention immediately by calling 911 or calling your MD immediately  if symptoms less severe.  You Must read complete instructions/literature along with all the possible adverse reactions/side effects for all the Medicines you take and that have been prescribed to you. Take any new Medicines after you have completely understood and accpet all the possible adverse reactions/side effects.   Please note  You were cared for by a hospitalist during your hospital stay. If you have any questions about your discharge medications or the care you received while you were in the hospital after you are discharged, you can call the unit and asked to speak with the hospitalist on call if the hospitalist that took care of you is not available. Once you are discharged, your primary care physician will handle any further medical issues. Please note that NO REFILLS for any discharge medications will be authorized once you are discharged, as it is imperative that you return to your primary care physician (or establish a relationship with a primary care physician if you do not have one) for your aftercare needs so that they can reassess your need for medications and monitor your lab values.     Today   No Facial or tongue numbness.  No headache.  Patient is clinically asymptomatic.  Will discharge home today.  VITAL SIGNS:  Blood pressure 123/89, pulse (!) 102, temperature 98.7 F (37.1 C), temperature source Oral, resp. rate 19, height 5\' 7"  (1.702 m), weight 122 kg, last menstrual period 08/30/2019, SpO2 99 %.  I/O:    Intake/Output Summary (Last 24 hours) at 09/03/2019 1545 Last data filed at 09/03/2019 0747 Gross per 24 hour  Intake 360 ml  Output 1 ml  Net 359 ml    PHYSICAL EXAMINATION:  GENERAL:  33 y.o.-year-old obese patient lying in the bed with no acute distress.  EYES: Pupils equal, round, reactive to light and  accommodation. No scleral icterus. Extraocular muscles intact.  HEENT: Head atraumatic, normocephalic. Oropharynx and nasopharynx clear.  NECK:  Supple, no jugular venous distention. No thyroid enlargement, no tenderness.  LUNGS: Normal breath sounds bilaterally, no wheezing, rales,rhonchi. No use of accessory muscles of respiration.  CARDIOVASCULAR: S1, S2 normal. No murmurs, rubs, or gallops.  ABDOMEN: Soft, non-tender, non-distended. Bowel sounds present. No organomegaly or mass.  EXTREMITIES: No pedal edema, cyanosis, or clubbing.  NEUROLOGIC: Cranial nerves II through XII are intact. No focal motor or sensory defecits b/l.  PSYCHIATRIC: The patient is alert and oriented x 3. Good affect.  SKIN: No obvious rash, lesion, or ulcer.   DATA REVIEW:   CBC Recent Labs  Lab 08/31/19 0415 09/01/19 0442  WBC 4.1  --   HGB 12.7 13.8  HCT 37.7  --   PLT 266  --     Chemistries  Recent Labs  Lab 09/01/19 0442 09/03/19 0615  NA 133*  --   K 3.3* 3.4*  CL 99  --   CO2  24  --   GLUCOSE 104*  --   BUN 15  --   CREATININE 0.56  --   CALCIUM 9.1  --   MG  --  1.9    Cardiac Enzymes No results for input(s): TROPONINI in the last 168 hours.  Microbiology Results  Results for orders placed or performed during the hospital encounter of 08/30/19  SARS CORONAVIRUS 2 (TAT 6-24 HRS) Nasopharyngeal Nasopharyngeal Swab     Status: None   Collection Time: 08/30/19  1:32 PM   Specimen: Nasopharyngeal Swab  Result Value Ref Range Status   SARS Coronavirus 2 NEGATIVE NEGATIVE Final    Comment: (NOTE) SARS-CoV-2 target nucleic acids are NOT DETECTED. The SARS-CoV-2 RNA is generally detectable in upper and lower respiratory specimens during the acute phase of infection. Negative results do not preclude SARS-CoV-2 infection, do not rule out co-infections with other pathogens, and should not be used as the sole basis for treatment or other patient management decisions. Negative results must  be combined with clinical observations, patient history, and epidemiological information. The expected result is Negative. Fact Sheet for Patients: HairSlick.no Fact Sheet for Healthcare Providers: quierodirigir.com This test is not yet approved or cleared by the [place] FDA and  has been authorized for detection and/or diagnosis of SARS-CoV-2 by FDA under an Emergency Use Authorization (EUA). This EUA will remain  in effect (meaning this test can be used) for the duration of the COVID-19 declaration under Section 56 4(b)(1) of the Act, 21 U.S.C. section 360bbb-3(b)(1), unless the authorization is terminated or revoked sooner. Performed at [facility], [address] [place], [place] [address]     RADIOLOGY:  No results found.    Management plans discussed with the patient, family and they are in agreement.  CODE STATUS:     Code Status Orders  (From admission, onward)         Start     Ordered   08/30/19 1351  Full code  Continuous     08/30/19 1351        Code Status History    This patient has a current code status but no historical code status.   Advance Care Planning Activity      TOTAL TIME TAKING CARE OF THIS PATIENT: 45 minutes.    Houston Siren M.D on 09/03/2019 at 3:45 PM  Between 7am to 6pm - Pager - [phone]  After 6pm go to [email or web address] - Social research officer, government  [facility] [place] [facility]  Office  [phone]  CC: Primary care physician; System, Pcp Not In

## 2019-09-03 NOTE — Clinical Social Work Note (Signed)
Bactrim filled at medication management and given to patient to take home with her. Patient will follow up at [facility].  CSW signing off.  Dayton Scrape, [place]

## 2019-09-03 NOTE — Progress Notes (Signed)
Date of Admission:  08/30/2019     Subjective: Doing fine No CNS symptoms No fever or headache No numbness rt side of face Medications:  . amLODipine  5 mg Oral Daily  . enoxaparin (LOVENOX) injection  40 mg Subcutaneous Q12H  . nicotine  21 mg Transdermal Daily    Objective: Vital signs in last 24 hours: Temp:  [98.2 F (36.8 C)-99.3 F (37.4 C)] 98.2 F (36.8 C) (10/09 0416) Pulse Rate:  [75-97] 77 (10/09 0416) Resp:  [16-22] 16 (10/09 0416) BP: (113-128)/(77-93) 113/81 (10/09 0416) SpO2:  [99 %-100 %] 100 % (10/09 0416)  PHYSICAL EXAM:  General: Alert, cooperative, no distress, appears stated age.  Head: Normocephalic, without obvious abnormality, atraumatic. Eyes: Conjunctivae clear, anicteric sclerae. Pupils are equal ENT Nares normal. No drainage or sinus tenderness. Lips, mucosa, and tongue normal. No Thrush Neck: Supple, symmetrical, no adenopathy, thyroid: non tender no carotid bruit and no JVD. Back: No CVA tenderness. Lungs: Clear to auscultation bilaterally. No Wheezing or Rhonchi. No rales. Heart: Regular rate and rhythm, no murmur, rub or gallop. Abdomen: Soft, non-tender,not distended. Bowel sounds normal. No masses Extremities: atraumatic, no cyanosis. No edema. No clubbing Skin: No rashes or lesions. Or bruising Lymph: Cervical, supraclavicular normal. Neurologic: Grossly non-focal  Lab Results Recent Labs    09/01/19 0442 09/03/19 0615  HGB 13.8  --   NA 133*  --   K 3.3* 3.4*  CL 99  --   CO2 24  --   BUN 15  --   CREATININE 0.56  --    Liver Panel No results for input(s): PROT, ALBUMIN, AST, ALT, ALKPHOS, BILITOT, BILIDIR, IBILI in the last 72 hours. Sedimentation Rate No results for input(s): ESRSEDRATE in the last 72 hours. C-Reactive Protein No results for input(s): CRP in the last 72 hours.  Infectious disease work up Cryptococcus antigen neg ToxoIgG>400 CMV DNA blood neg EBV DNA PCR blood negative RPR NEG Quantiferon Gold  Neg Beta D glucan ( fungitell) Neg JC virus DNA pending VZV DNA pending  Studies/Results: MRI brain Multiple peripherally enhancing parenchymal lesions which are predominantly juxtacortical/subcortical in location with surrounding edema, new as compared to MRI 05/25/2019. A dominant 2 cm lesion within the anterior left frontal lobe has prominent surrounding edema with associated mass effect and resultant early rightward subfalcine herniation.  Assessment/Plan:  CNS space occupying lesions:pt asymptomatic now  D.D toxoplasma, PML, Lymphoma  And other Ois VS MS .  LP considered risky after discussion with neurologist- Brain biopsy deferred .  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 Caveat-she is totally asymptomatic and Cd4 >300 with 16% . Toxo usually seen in severe immune suppression < 100 But the plan is to treat like toxo and repeat MRI in 2 weeks to see appropriate change in the lesions in brain if the diagnosis is correct Once available she will take sulfadiazine 1500mg  Q6 , Pyrimethamine 75mg  once a day ( after loading dose of 200mg ), leucovorin 25 mg every day She is currently on Trim/sulfa and on discharge today will be given oral bactrim DS she needs 7.5 tablets a day  HIV- cd4 of 360 and Vl of 1.4 million HAART will be started 2-3 weeks following toxo treatment and also after repeat MRI as risk for paradoxical worsening of SOL of brain  due to toxo can happen if given early  Pt currently has no insuarnce She will need to enrol in HMAP( HIV medication assistance program)-  spoke to [employer] appt made for next week- Antitoxo treatment of sulfadiazine 1500mg  Q6, pyrimetamine 200mg  loading dose and thn 75 mg nce a day along with leucovorin 25 mg will be started after she gets HMAP  She will need MRI with contrast of brain in 2 weeks Will discuss with NP at [facility] If pt develops seizures, headache fever or focal neurological signs she will  return to ED. 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

## 2019-09-03 NOTE — Progress Notes (Signed)
   09/03/19 1100  Clinical Encounter Type  Visited With Patient  Visit Type Follow-up  Ch found the pt taking a walk on the unit. Pt said she wanted to go out take a fresh air. Pt is feeling ready to be discharged but feels frustrated that the discharge plan hasn't been set yet. Pt also requested that ch continues to pray for her.

## 2019-09-03 NOTE — Progress Notes (Signed)
Penny Young was admitted to the Hospital on 08/30/2019 and Discharged  09/03/2019 and should be excused from work/school   for 21 days starting 08/30/2019 , may return to work/school without any restrictions.  Call Abel Presto MD, [facility]  [phone] with questions.  Henreitta Leber M.D on 09/03/2019,at 11:54 AM

## 2019-09-03 NOTE — Progress Notes (Signed)
Patient discharged. Shaneece Stockburger S, RN  

## 2019-09-04 LAB — VARICELLA-ZOSTER BY PCR: Varicella-Zoster, PCR: NEGATIVE

## 2019-09-04 LAB — JC VIRUS DNA,PCR (WHOLE BLOOD): JC Virus DNA, PCR, Blood: NEGATIVE

## 2019-09-14 ENCOUNTER — Ambulatory Visit: Payer: Medicaid Other | Attending: Infectious Diseases | Admitting: Infectious Diseases

## 2019-09-14 ENCOUNTER — Encounter: Payer: Self-pay | Admitting: Infectious Diseases

## 2019-09-14 ENCOUNTER — Other Ambulatory Visit
Admission: RE | Admit: 2019-09-14 | Discharge: 2019-09-14 | Disposition: A | Discharge status: Home / Self Care | Payer: Medicaid Other | Source: Ambulatory Visit | Attending: Infectious Diseases | Admitting: Infectious Diseases

## 2019-09-14 ENCOUNTER — Other Ambulatory Visit: Payer: Self-pay

## 2019-09-14 VITALS — BP 125/90 | HR 94 | Temp 98.1°F | Ht 67.0 in | Wt 264.5 lb

## 2019-09-14 DIAGNOSIS — B589 Toxoplasmosis, unspecified: Secondary | ICD-10-CM | POA: Insufficient documentation

## 2019-09-14 DIAGNOSIS — F17210 Nicotine dependence, cigarettes, uncomplicated: Secondary | ICD-10-CM

## 2019-09-14 DIAGNOSIS — Z792 Long term (current) use of antibiotics: Secondary | ICD-10-CM

## 2019-09-14 DIAGNOSIS — Z886 Allergy status to analgesic agent status: Secondary | ICD-10-CM

## 2019-09-14 DIAGNOSIS — Z21 Asymptomatic human immunodeficiency virus [HIV] infection status: Secondary | ICD-10-CM

## 2019-09-14 DIAGNOSIS — L853 Xerosis cutis: Secondary | ICD-10-CM

## 2019-09-14 DIAGNOSIS — B582 Toxoplasma meningoencephalitis: Secondary | ICD-10-CM

## 2019-09-14 DIAGNOSIS — Z79899 Other long term (current) drug therapy: Secondary | ICD-10-CM

## 2019-09-14 DIAGNOSIS — R29818 Other symptoms and signs involving the nervous system: Secondary | ICD-10-CM

## 2019-09-14 HISTORY — DX: Toxoplasma meningoencephalitis: B58.2

## 2019-09-14 LAB — CBC WITH DIFFERENTIAL/PLATELET
Abs Immature Granulocytes: 0 10*3/uL (ref 0.00–0.07)
Basophils Absolute: 0 10*3/uL (ref 0.0–0.1)
Basophils Relative: 0 %
Eosinophils Absolute: 0.5 10*3/uL (ref 0.0–0.5)
Eosinophils Relative: 11 %
HCT: 37.6 % (ref 36.0–46.0)
Hemoglobin: 12.4 g/dL (ref 12.0–15.0)
Immature Granulocytes: 0 %
Lymphocytes Relative: 62 %
Lymphs Abs: 2.8 10*3/uL (ref 0.7–4.0)
MCH: 26.1 pg (ref 26.0–34.0)
MCHC: 33 g/dL (ref 30.0–36.0)
MCV: 79 fL — ABNORMAL LOW (ref 80.0–100.0)
Monocytes Absolute: 0.4 10*3/uL (ref 0.1–1.0)
Monocytes Relative: 8 %
Neutro Abs: 0.9 10*3/uL — ABNORMAL LOW (ref 1.7–7.7)
Neutrophils Relative %: 19 %
Platelets: 226 10*3/uL (ref 150–400)
RBC: 4.76 MIL/uL (ref 3.87–5.11)
RDW: 14.6 % (ref 11.5–15.5)
Smear Review: NORMAL
WBC: 4.6 10*3/uL (ref 4.0–10.5)
nRBC: 0 % (ref 0.0–0.2)

## 2019-09-14 LAB — COMPREHENSIVE METABOLIC PANEL
ALT: 70 U/L — ABNORMAL HIGH (ref 0–44)
AST: 57 U/L — ABNORMAL HIGH (ref 15–41)
Albumin: 3.8 g/dL (ref 3.5–5.0)
Alkaline Phosphatase: 93 U/L (ref 38–126)
Anion gap: 10 (ref 5–15)
BUN: 9 mg/dL (ref 6–20)
CO2: 24 mmol/L (ref 22–32)
Calcium: 9 mg/dL (ref 8.9–10.3)
Chloride: 101 mmol/L (ref 98–111)
Creatinine, Ser: 0.62 mg/dL (ref 0.44–1.00)
GFR calc Af Amer: >60 mL/min (ref >60)
GFR calc non Af Amer: >60 mL/min (ref >60)
Glucose, Bld: 102 mg/dL — ABNORMAL HIGH (ref 70–99)
Potassium: 4.1 mmol/L (ref 3.5–5.1)
Sodium: 135 mmol/L (ref 135–145)
Total Bilirubin: 0.7 mg/dL (ref 0.3–1.2)
Total Protein: 9.5 g/dL — ABNORMAL HIGH (ref 6.5–8.1)

## 2019-09-14 NOTE — Patient Instructions (Signed)
You are here for a follow up after recent hospitalization- you are being treated for brain lesions due to toxoplasma- you are now on Bactrim DS 2 -1/2 tablet three times a day until you get insurance thru [employer] center and and then your meds will be changed to Sulfadiazine, pyrimethamine and folinic acid You have itching of your skin but no rash except for a patch of skin on your back which is scaly. You are currently taking benadryl fir the itch, if you are drowsy or more sleepy you can switch to clarityn or allegra You need to get MRI of the brain to see whether there is improvement. After the MRI we can decide on starting meds for the virus. 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

## 2019-09-14 NOTE — Progress Notes (Signed)
NAME: Penny Young  DOB: 1986-07-13  MRN: [id number]  Date/Time: 09/14/2019 11:19 AM   Subjective:  Here for follow-up after hospital discharge ? Penny Young is a 33 y.o. female with a history of HIV diagnosed November 2009 when she was pregnant with her second child.  At that time she was on Kaletra and Combivir.  She was followed by Dr. Philipp Deputy at our [facility].  After delivery patient to stop the medications as she was asymptomatic and her viral load was very low and CD4 was 1360 with 66%.  She was asked to follow-up with ID in 3-6 months but patient dropped out of care.  She misunderstood that the HIV was cured. Patient presented to the [facility] ED with numbness of the face and loss of sensation of taste on 08/30/2019 and MRI of the brain with contrast showed multiple peripheral enhancing parenchymal lesions which are predominantly juxtacortical/subcortical in location with surrounding edema which was new as compared to MRI of 05/25/2019.  A dominant 2 cm lesion within the anterior left frontal lobe has prominent surrounding edema with associated mass-effect and resultant early rightward subfalcine herniation. The differential diagnosis for this type of lesions were extensive and toxoplasma, lymphoma, PML wearing the top diagnosis.  Extensive blood work was sent for toxo, crypto, VZV, CMV, EBV, JC virus, RPR and QuantiFERON gold, fungal antibodies and Fungitell.  HIV RNA and CD4 were also sent.  The toxo titers came back positive with IgG of more than 300.  She was also seen by neurologist.  She was started on trimethoprim sulfamethoxazole 5 mg/kg body weight every 12 hours.  Pyrimethamine and sulfasalazine was not available immediately. As patient was responding to Bactrim and wanted to go home she was discharged on 09/03/2019 on Bactrim double strength 200 tablets 3 times a day. She was asked to follow-up with me as outpatient as well as  with [facility] for HIV care and application for HIV medication Assistance program ( HMAP) The plan was to continue toxo treatment for 2 to 3 weeks and then repeat an MRI of the brain to look for any difference in the size of the lesions.  If there was improvement it would confirm CNS toxoplasmosis.  If there was no improvement then she would need biopsy. Antiretroviral therapy is delayed until she completes at least 3 weeks of toxoplasma treatment.  Meanwhile patient went to [facility] on 09/09/2019 and paperwork for [facility] was completed  Patient has been feeling better since her discharge.  She is taking Bactrim regularly.  She has whole body itching but no rash.  She takes Benadryl.  Patient says her headache is much improved.  She does not have any numbness of her face.  No fever except for one episode of 101.3.  No diarrhea.  No weight loss.  No cough or shortness of breath. 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  HIV diagnosed 2009 Nadir Cd4  >500 HAARt history: Combivir and Kaletra Genotype ? Past Medical History:  Diagnosis Date   Asthma    Bipolar 1 disorder (HCC)    Drug abuse (HCC)    Hepatitis B    HIV (human immunodeficiency virus infection) (HCC)    Hypertension    Obese    Restless leg syndrome    Schizophrenia (HCC)     Past Surgical History:  Procedure Laterality Date   HERNIA REPAIR  TUBAL LIGATION     TYMPANOSTOMY TUBE PLACEMENT      Social History   Socioeconomic History   Marital status: Single    Spouse name: Not on file   Number of children: 1   Years of education: Not on file   Highest education level: Not on file  Occupational History   Not on file  Social Needs   Financial resource strain: Not on file   Food insecurity    Worry: Not on file    Inability: Not on file   Transportation needs      Medical: Not on file    Non-medical: Not on file  Tobacco Use   Smoking status: Current Every Day Smoker    Packs/day: 0.10    Types: Cigarettes    Last attempt to quit: 11/26/2007    Years since quitting: 11.8   Smokeless tobacco: Never Used  Substance and Sexual Activity   Alcohol use: Yes    Comment: occ   Drug use: Not Currently    Types: Marijuana    Comment: past abuse   Sexual activity: Not Currently  Lifestyle   Physical activity    Days per week: Not on file    Minutes per session: Not on file   Stress: Not on file  Relationships   Social connections    Talks on phone: Not on file    Gets together: Not on file    Attends religious service: Not on file    Active member of club or organization: Not on file    Attends meetings of clubs or organizations: Not on file    Relationship status: Not on file   Intimate partner violence    Fear of current or ex partner: Not on file    Emotionally abused: Not on file    Physically abused: Not on file    Forced sexual activity: Not on file  Other Topics Concern   Not on file  Social History Narrative   Not on file    Family History  Problem Relation Age of Onset   Hypertension Other    Allergies  Allergen Reactions   Aspirin Itching   ? Current Outpatient Medications  Medication Sig Dispense Refill   amLODipine (NORVASC) 5 MG tablet Take 1 tablet (5 mg total) by mouth daily. 30 tablet 3   hydrochlorothiazide (HYDRODIURIL) 12.5 MG tablet Take 1 tablet (12.5 mg total) by mouth daily. 30 tablet 3   sulfamethoxazole-trimethoprim (BACTRIM DS) 800-160 MG tablet Take 2.5 tablets by mouth every 8 (eight) hours for 28 days. 210 tablet 0   No current facility-administered medications for this visit.     REVIEW OF SYSTEMS:  Const: negative fever, negative chills, negative weight loss Eyes: negative diplopia or visual changes, negative eye pain ENT: negative coryza, negative sore throat Resp: negative  cough, hemoptysis, dyspnea Cards: negative for chest pain, palpitations, lower extremity edema GU: negative for frequency, dysuria and hematuria Skin: negative for rash and pruritus Heme: negative for easy bruising and gum/nose bleeding MS: negative for myalgias, arthralgias, back pain and muscle weakness Neurolo:negative for headaches, dizziness, vertigo, memory problems  Psych: negative for feelings of anxiety, depression  Pertinent Positives include : Objective:  VITALS:  BP 125/90 (BP Location: Left Arm, Patient Position: Sitting, Cuff Size: Large)    Pulse 94    Temp 98.1 F (36.7 C) (Oral)    Ht 5\' 7"  (1.702 m)    Wt 264 lb 8 oz (120 kg)  LMP 08/30/2019 (Exact Date)    BMI 41.43 kg/m  PHYSICAL EXAM:  General: Alert, cooperative, no distress, appears stated age.  Head: Normocephalic, without obvious abnormality, atraumatic. Eyes: Conjunctivae clear, anicteric sclerae. Pupils are equal Nose: Nares normal. No drainage or sinus tenderness. Throat: Lips, mucosa, and tongue normal. No Thrush Neck: Supple, symmetrical, no adenopathy, thyroid: non tender no carotid bruit and no JVD. Back: No CVA tenderness. Small patch of dry/scaly skin mid back    Lungs: Clear to auscultation bilaterally. No Wheezing or Rhonchi. No rales. Heart: Regular rate and rhythm, no murmur, rub or gallop. Abdomen: Soft, non-tender,not distended. Bowel sounds normal. No masses Extremities: Extremities normal, atraumatic, no cyanosis. No edema. No clubbing Skin: No rashes or lesions. Not Jaundiced Lymph: Cervical, supraclavicular normal. Neurologic: Grossly non-focal Pertinent Labs : Health maintenance Vaccination  Vaccine Date last given comment  Influenza    Hepatitis B    Hepatitis A    Prevnar-PCV-13    Pneumovac-PPSV-23    TdaP    HPV    Shingrix ( zoster vaccine)     ______________________  Labs Lab Result  Date comment  HIV VL 1.18 million 08/30/19   CD4 370(16%) 08/30/19   Genotype      [id number]     HIV antibody     RPR  NR 08/30/2019   Quantiferon Gold  negative 08/30/2019   Hep C ab  nonreactive    Hepatitis B-ab,ag,c  core +    Hepatitis A-IgM, IgG /T     Lipid     GC/CHL     PAP     HB,PLT,Cr, LFT      JC virus DNA negative CMV DNA negative Epstein-Barr virus DNA negative Cryptococcal antigen negative Toxoplasma IgG more than 400 IgM 6 Preventive  Procedure Result  Date comment  colonoscopy     Mammogram     Dental exam     Opthal       Impression/Recommendation ? 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  As toxoplasma serologies more than 400 and highly positive.  She is currently on Bactrim double strength 2-1/2 tablets 3 times a day. Once she gets HMAP approved she will be started on sulfadiazine, pyrimethamine and folinic acid. After 2 to 3 weeks of treatment will get an MRI of the brain to see whether there is improvement in the lesions. If there is improvement then it confirms toxoplasma and she will continue with the treatment If there is no improvement she will have to be referred to neurosurgeon for biopsy.  Pruritus secondary to Bactrim.  No rash except for a small patch of dry skin on the back.  She will continue to take Benadryl.  If that makes her sleepy then she can switched over to Claritin.  HIV/AIDS: Last CD4 count 300.  Has not been on treatment for the past 11 years.  Last took Combivir and Kaletra when she was pregnant in 2009.  At that time her T-cell count was 1500. Plan is to give at least 3 weeks of toxoplasma treatment and then start her on HIV medication  Patient does not have insurance.  Need to figure out how we can get MRI of the brain.  Also discussed with nurse practitioner Kothari at [facility] center and also discussed with social worker Robie Ridge. ? __We will follow her  in 2 weeks.  _________________________________________________ Discussed with patient

## 2019-09-16 ENCOUNTER — Other Ambulatory Visit: Payer: Self-pay | Admitting: Infectious Diseases

## 2019-09-16 LAB — GENOSURE PRIME (GSPRIL)

## 2019-09-17 LAB — FUNGAL ANTIBODIES PANEL, ID-BLOOD
Aspergillus flavus: NEGATIVE
Aspergillus fumigatus, IgG: NEGATIVE
Aspergillus niger: NEGATIVE
Blastomyces Abs, Qn, DID: NEGATIVE

## 2019-09-21 ENCOUNTER — Encounter: Payer: Self-pay | Admitting: Infectious Diseases

## 2019-09-21 ENCOUNTER — Telehealth: Payer: Self-pay | Admitting: Infectious Diseases

## 2019-09-21 ENCOUNTER — Other Ambulatory Visit: Payer: Medicaid Other

## 2019-09-21 NOTE — Telephone Encounter (Signed)
Patient is wanting to know about her medicine and also had MRI yesterday and is wanting to know about making an appt or not   [phone]

## 2019-09-21 NOTE — Telephone Encounter (Signed)
Patient confused after having called [facility] and [place] about her meds. She read labels and I was able to instruct her on how to take them based on the instructions on bottles. Informed her that MRI is not in yet and we will contact her when it is in.

## 2019-09-21 NOTE — Telephone Encounter (Signed)
Spoke to the patient in detail about the treatment for toxoplasma and how to take he different medications.

## 2019-09-28 ENCOUNTER — Ambulatory Visit: Payer: Medicaid Other | Admitting: Infectious Diseases

## 2019-11-03 ENCOUNTER — Other Ambulatory Visit: Payer: Self-pay

## 2019-11-03 ENCOUNTER — Emergency Department: Payer: Medicaid Other

## 2019-11-03 ENCOUNTER — Encounter: Payer: Self-pay | Admitting: Emergency Medicine

## 2019-11-03 ENCOUNTER — Emergency Department
Admission: EM | Admit: 2019-11-03 | Discharge: 2019-11-03 | Disposition: A | Discharge status: Home / Self Care | Payer: Medicaid Other | Attending: Student in an Organized Health Care Education/Training Program | Admitting: Student in an Organized Health Care Education/Training Program

## 2019-11-03 DIAGNOSIS — S63501A Unspecified sprain of right wrist, initial encounter: Secondary | ICD-10-CM | POA: Diagnosis not present

## 2019-11-03 DIAGNOSIS — I10 Essential (primary) hypertension: Secondary | ICD-10-CM | POA: Diagnosis not present

## 2019-11-03 DIAGNOSIS — R200 Anesthesia of skin: Secondary | ICD-10-CM | POA: Diagnosis not present

## 2019-11-03 DIAGNOSIS — Y998 Other external cause status: Secondary | ICD-10-CM | POA: Diagnosis not present

## 2019-11-03 DIAGNOSIS — B20 Human immunodeficiency virus [HIV] disease: Secondary | ICD-10-CM | POA: Insufficient documentation

## 2019-11-03 DIAGNOSIS — Y92512 Supermarket, store or market as the place of occurrence of the external cause: Secondary | ICD-10-CM | POA: Diagnosis not present

## 2019-11-03 DIAGNOSIS — Y9301 Activity, walking, marching and hiking: Secondary | ICD-10-CM | POA: Insufficient documentation

## 2019-11-03 DIAGNOSIS — R202 Paresthesia of skin: Secondary | ICD-10-CM | POA: Diagnosis not present

## 2019-11-03 DIAGNOSIS — W19XXXA Unspecified fall, initial encounter: Secondary | ICD-10-CM

## 2019-11-03 DIAGNOSIS — J45909 Unspecified asthma, uncomplicated: Secondary | ICD-10-CM | POA: Insufficient documentation

## 2019-11-03 DIAGNOSIS — F17210 Nicotine dependence, cigarettes, uncomplicated: Secondary | ICD-10-CM | POA: Insufficient documentation

## 2019-11-03 DIAGNOSIS — W010XXA Fall on same level from slipping, tripping and stumbling without subsequent striking against object, initial encounter: Secondary | ICD-10-CM | POA: Diagnosis not present

## 2019-11-03 DIAGNOSIS — S6991XA Unspecified injury of right wrist, hand and finger(s), initial encounter: Secondary | ICD-10-CM | POA: Diagnosis present

## 2019-11-03 DIAGNOSIS — Z79899 Other long term (current) drug therapy: Secondary | ICD-10-CM | POA: Diagnosis not present

## 2019-11-03 MED ORDER — TRAMADOL HCL 50 MG PO TABS: 50.0000 mg | ORAL_TABLET | Freq: Four times a day (QID) | ORAL | 0 refills | Status: DC | PRN

## 2019-11-03 MED ORDER — MELOXICAM 15 MG PO TABS: 15.0000 mg | ORAL_TABLET | Freq: Every day | ORAL | 0 refills | Status: DC

## 2019-11-03 MED ORDER — OXYCODONE-ACETAMINOPHEN 5-325 MG PO TABS
1.0000 | ORAL_TABLET | Freq: Once | ORAL | Status: AC
  Administered 2019-11-03: 1 via ORAL
  Filled 2019-11-03: qty 1

## 2019-11-03 NOTE — ED Provider Notes (Signed)
[facility] Emergency Department Provider Note  ____________________________________________  Time seen: Approximately 4:27 PM  I have reviewed the triage vital signs and the nursing notes.   HISTORY  Chief Complaint Fall    HPI Penny Young is a 33 y.o. female who presents the emergency department complaining of right forearm and hand pain after a fall.  Patient reports that she was at [employer], was attempting to that over an oil spill when she lost her footing, fell.  Patient reports that she fell with outstretched hands.  She is unsure how she actually landed but is complaining of pain to the right hand, wrist and forearm region.  Patient states that the pain has limited her range of motion.  She does have some numbness and tingling in the digits of the right hand.  Patient denies any elbow or shoulder pain.  She did not hit her head or lose consciousness.  Patient denies any other injury from this fall.  Patient has a history of asthma, bipolar disorder, toxoplasmosis, hepatitis, HIV, hypertension obesity and schizophrenia.  No complaints of chronic medical problems.         Past Medical History:  Diagnosis Date  . Asthma   . Bipolar 1 disorder (HCC)   . 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 Drug abuse (HCC)   . Hepatitis B   . HIV (human immunodeficiency virus infection) (HCC)   . Hypertension   . Obese   . Restless leg syndrome   . Schizophrenia [facility])     Patient Active Problem List   Diagnosis Date Noted  . 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 HIV DISEASE 04/27/2008    Past Surgical History:  Procedure Laterality Date  . HERNIA REPAIR    . TUBAL LIGATION    . TYMPANOSTOMY TUBE PLACEMENT      Prior to Admission medications   Medication Sig Start Date End Date Taking? Authorizing Provider  amLODipine (NORVASC) 5 MG tablet Take 1 tablet (5 mg total) by mouth daily. 05/25/19   Jene Every, MD  hydrochlorothiazide (HYDRODIURIL) 12.5  MG tablet Take 1 tablet (12.5 mg total) by mouth daily. 05/25/19   Jene Every, MD  meloxicam (MOBIC) 15 MG tablet Take 1 tablet (15 mg total) by mouth daily. 11/03/19   , Delorise Royals, PA-C  traMADol (ULTRAM) 50 MG tablet Take 1 tablet (50 mg total) by mouth every 6 (six) hours as needed. 11/03/19   , Delorise Royals, PA-C    Allergies Aspirin  Family History  Problem Relation Age of Onset  . Hypertension Other     Social History Social History   Tobacco Use  . Smoking status: Current Every Day Smoker    Packs/day: 0.10    Types: Cigarettes    Last attempt to quit: 11/26/2007    Years since quitting: 11.9  . Smokeless tobacco: Never Used  Substance Use Topics  . Alcohol use: Yes    Comment: occ  . Drug use: Not Currently    Types: Marijuana    Comment: past abuse     Review of Systems  Constitutional: No fever/chills Eyes: No visual changes. No discharge ENT: No upper respiratory complaints. Cardiovascular: no chest pain. Respiratory: no cough. No SOB. Gastrointestinal: No abdominal pain.  No nausea, no vomiting.  Musculoskeletal: Positive for right hand and forearm pain after a fall Skin: Negative for rash, abrasions, lacerations, ecchymosis. Neurological: Negative for headaches, focal weakness or numbness. 10-point ROS otherwise negative.  ____________________________________________   PHYSICAL EXAM:  VITAL SIGNS: ED Triage Vitals  Enc Vitals Group     BP 11/03/19 1611 (!) 156/91     Pulse Rate 11/03/19 1611 94     Resp 11/03/19 1611 18     Temp 11/03/19 1611 99.2 F (37.3 C)     Temp Source 11/03/19 1611 Oral     SpO2 11/03/19 1611 99 %     Weight 11/03/19 1612 264 lb (119.7 kg)     Height 11/03/19 1612 5\' 7"  (1.702 m)     Head Circumference --      Peak Flow --      Pain Score 11/03/19 1614 10     Pain Loc --      Pain Edu? --      Excl. in GC? --      Constitutional: Alert and oriented. Well appearing and in no acute  distress. Eyes: Conjunctivae are normal. PERRL. EOMI. Head: Atraumatic. ENT:      Ears:       Nose: No congestion/rhinnorhea.      Mouth/Throat: Mucous membranes are moist.  Neck: No stridor.   Cardiovascular: Normal rate, regular rhythm. Normal S1 and S2.  Good peripheral circulation. Respiratory: Normal respiratory effort without tachypnea or retractions. Lungs CTAB. Good air entry to the bases with no decreased or absent breath sounds. Musculoskeletal: Full range of motion to all extremities. No gross deformities appreciated.  Visualization of the right hand and forearm reveals mild edema about the fourth and fifth metacarpal extending into the distal ulna region.  No gross deformity, edema, ecchymosis abrasions or lacerations noted to the wrist itself.  Limited range of motion to the wrist due to pain.  Patient is able to flex and extend all digits.  Sensation and capillary refill intact.  Radial pulse intact.  Examination of the elbow, shoulder is unremarkable. Neurologic:  Normal speech and language. No gross focal neurologic deficits are appreciated.  Skin:  Skin is warm, dry and intact. No rash noted. Psychiatric: Mood and affect are normal. Speech and behavior are normal. Patient exhibits appropriate insight and judgement.   ____________________________________________   LABS (all labs ordered are listed, but only abnormal results are displayed)  Labs Reviewed - No data to display ____________________________________________  EKG   ____________________________________________  RADIOLOGY I personally viewed and evaluated these images as part of my medical decision making, as well as reviewing the written report by the radiologist.  Dg Forearm Right  Result Date: 11/03/2019 CLINICAL DATA:  Fall.  Arm pain EXAM: RIGHT FOREARM - 2 VIEW COMPARISON:  None. FINDINGS: There is no evidence of fracture or other focal bone lesions. Soft tissues are unremarkable. IMPRESSION: Negative.  Electronically Signed   By: 12-21[birth year]1970 M.D.   On: 11/03/2019 16:45   Dg Hand Complete Right  Result Date: 11/03/2019 CLINICAL DATA:  Fall.  Hand pain EXAM: RIGHT HAND - COMPLETE 3+ VIEW COMPARISON:  None. FINDINGS: There is no evidence of fracture or dislocation. There is no evidence of arthropathy or other focal bone abnormality. Soft tissues are unremarkable. IMPRESSION: Negative. Electronically Signed   By: 12-21-1970 M.D.   On: 11/03/2019 16:54    ____________________________________________    PROCEDURES  Procedure(s) performed:    .Splint Application  Date/Time: 11/03/2019 5:15 PM Performed by: 14/07/2019, PA-C Authorized by: Racheal Patches, PA-C   Consent:    Consent obtained:  Verbal   Consent given by:  Patient   Risks discussed:  Pain Pre-procedure details:  Sensation:  Normal Procedure details:    Laterality:  Right   Location:  Wrist   Wrist:  R wrist   Splint type:  Wrist   Supplies:  Prefabricated splint Post-procedure details:    Pain:  Unchanged   Sensation:  Normal   Patient tolerance of procedure:  Tolerated well, no immediate complications      Medications  oxyCODONE-acetaminophen (PERCOCET/ROXICET) 5-325 MG per tablet 1 tablet (1 tablet Oral Given 11/03/19 1645)     ____________________________________________   INITIAL IMPRESSION / ASSESSMENT AND PLAN / ED COURSE  Pertinent labs & imaging results that were available during my care of the patient were reviewed by me and considered in my medical decision making (see chart for details).  Review of the [place] CSRS was performed in accordance of the [place] prior to dispensing any controlled drugs.           Patient's diagnosis is consistent with fall, wrist sprain. Patient presented to the ED complaining of R wrist/forearm injury after a fall. Patient had mild edema to the hand and wrist. No deformity. Neurologically intact. Imaging reveals no identified traumatic  findings. Patient will receive symptom control medications at home and velcro splint applied. Follow-up with orthopedics if needed. Patient is given ED precautions to return to the ED for any worsening or new symptoms.     ____________________________________________  FINAL CLINICAL IMPRESSION(S) / ED DIAGNOSES  Final diagnoses:  Fall, initial encounter  Sprain of right wrist, initial encounter      NEW MEDICATIONS STARTED DURING THIS VISIT:  ED Discharge Orders         Ordered    meloxicam (MOBIC) 15 MG tablet  Daily     11/03/19 1725    traMADol (ULTRAM) 50 MG tablet  Every 6 hours PRN     11/03/19 1725              This chart was dictated using voice recognition software/Dragon. Despite best efforts to proofread, errors can occur which can change the meaning. Any change was purely unintentional.    Darletta Moll, PA-C 11/03/19 1726    Merlyn Lot, MD 11/03/19 2025

## 2019-11-03 NOTE — ED Triage Notes (Signed)
Pt slipped in oil spot in store and c/o right arm pain to forearm.  Ambulatory. VSS.

## 2019-12-03 ENCOUNTER — Telehealth: Payer: Self-pay | Admitting: Pharmacy Technician

## 2019-12-03 NOTE — Telephone Encounter (Signed)
Patient failed to provide requested 2020 financial documentation. No additional medication assistance will be provided by [facility] without the required proof of income documentation. Patient notified by letter.    Nicholos Aloisi [facility]  

## 2020-03-28 ENCOUNTER — Ambulatory Visit: Payer: Medicaid Other | Admitting: Orthopaedic Surgery

## 2020-04-03 ENCOUNTER — Other Ambulatory Visit: Payer: Self-pay

## 2020-04-03 ENCOUNTER — Encounter: Payer: Self-pay | Admitting: Emergency Medicine

## 2020-04-03 ENCOUNTER — Emergency Department
Admission: EM | Admit: 2020-04-03 | Discharge: 2020-04-03 | Disposition: A | Discharge status: Home / Self Care | Payer: Medicaid Other | Attending: Emergency Medicine | Admitting: Emergency Medicine

## 2020-04-03 DIAGNOSIS — B20 Human immunodeficiency virus [HIV] disease: Secondary | ICD-10-CM | POA: Diagnosis not present

## 2020-04-03 DIAGNOSIS — J45909 Unspecified asthma, uncomplicated: Secondary | ICD-10-CM | POA: Insufficient documentation

## 2020-04-03 DIAGNOSIS — F17210 Nicotine dependence, cigarettes, uncomplicated: Secondary | ICD-10-CM | POA: Diagnosis not present

## 2020-04-03 DIAGNOSIS — Z79899 Other long term (current) drug therapy: Secondary | ICD-10-CM | POA: Insufficient documentation

## 2020-04-03 DIAGNOSIS — H1033 Unspecified acute conjunctivitis, bilateral: Secondary | ICD-10-CM | POA: Diagnosis not present

## 2020-04-03 DIAGNOSIS — I10 Essential (primary) hypertension: Secondary | ICD-10-CM | POA: Diagnosis not present

## 2020-04-03 DIAGNOSIS — H5713 Ocular pain, bilateral: Secondary | ICD-10-CM | POA: Diagnosis present

## 2020-04-03 MED ORDER — POLYMYXIN B-TRIMETHOPRIM 10000-0.1 UNIT/ML-% OP SOLN: 1.0000 [drp] | OPHTHALMIC | 0 refills | Status: AC

## 2020-04-03 MED ORDER — FLUORESCEIN SODIUM 1 MG OP STRP
1.0000 | ORAL_STRIP | Freq: Once | OPHTHALMIC | Status: AC
  Administered 2020-04-03: 1 via OPHTHALMIC
  Filled 2020-04-03: qty 1

## 2020-04-03 MED ORDER — TETRACAINE HCL 0.5 % OP SOLN
2.0000 [drp] | Freq: Once | OPHTHALMIC | Status: AC
  Administered 2020-04-03: 2 [drp] via OPHTHALMIC
  Filled 2020-04-03: qty 4

## 2020-04-03 NOTE — ED Triage Notes (Signed)
[place] the park [place]  Woke up with drainage and swelling to right eye

## 2020-04-03 NOTE — ED Provider Notes (Signed)
[facility] Emergency Department Provider Note ____________________________________________   First MD Initiated Contact with Patient 04/03/20 1400     (approximate)  I have reviewed the triage vital signs and the nursing notes.   HISTORY  Chief Complaint Eye Pain    HPI Penny Young is a 34 y.o. female with PMH as noted below who presents with pain to the right eye since this morning, now spreading to the left eye, described as a burning, and associated with the discharge earlier this morning.  She states when she woke up her eye was stuck shut.  She states that the right eye feels slightly blurry although she is able to clear it.  She denies any other vision changes.  She states she was out in the park yesterday, but denies any trauma to the eye.  Past Medical History:  Diagnosis Date  . Asthma   . Bipolar 1 disorder ([place])   . 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 Drug abuse ([place])   . Hepatitis B   . HIV (human immunodeficiency virus infection) ([place])   . Hypertension   . Obese   . Restless leg syndrome   . Schizophrenia [facility])     Patient Active Problem List   Diagnosis Date Noted  . 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 HIV DISEASE 04/27/2008    Past Surgical History:  Procedure Laterality Date  . HERNIA REPAIR    . TUBAL LIGATION    . TYMPANOSTOMY TUBE PLACEMENT      Prior to Admission medications   Medication Sig Start Date End Date Taking? Authorizing Provider  amLODipine (NORVASC) 5 MG tablet Take 1 tablet (5 mg total) by mouth daily. 05/25/19   Lavonia Drafts, MD  hydrochlorothiazide (HYDRODIURIL) 12.5 MG tablet Take 1 tablet (12.5 mg total) by mouth daily. 05/25/19   Lavonia Drafts, MD  meloxicam (MOBIC) 15 MG tablet Take 1 tablet (15 mg total) by mouth daily. 11/03/19   Cuthriell, Charline Bills, PA-C  traMADol (ULTRAM) 50 MG tablet Take 1 tablet (50 mg total) by mouth every 6 (six) hours as needed. 11/03/19   Cuthriell, Charline Bills, PA-C    trimethoprim-polymyxin b (POLYTRIM) ophthalmic solution Place 1 drop into both eyes every 4 (four) hours for 5 days. 04/03/20 04/08/20  Arta Silence, MD    Allergies Aspirin  Family History  Problem Relation Age of Onset  . Hypertension Other     Social History Social History   Tobacco Use  . Smoking status: Current Every Day Smoker    Packs/day: 0.10    Types: Cigarettes    Last attempt to quit: 11/26/2007    Years since quitting: 12.3  . Smokeless tobacco: Never Used  Substance Use Topics  . Alcohol use: Yes    Comment: occ  . Drug use: Not Currently    Types: Marijuana    Comment: past abuse    Review of Systems  Constitutional: No fever. Eyes: Positive for eye irritation. ENT: No nasal congestion or rhinorrhea.. Gastrointestinal: No nausea or vomiting. Skin: Negative for rash. Neurological: Negative for headache.   ____________________________________________   PHYSICAL EXAM:  VITAL SIGNS: ED Triage Vitals  Enc Vitals Group     BP 04/03/20 1401 140/88     Pulse Rate 04/03/20 1401 88     Resp 04/03/20 1401 18     Temp 04/03/20 1401 98 F (36.7 C)     Temp Source 04/03/20 1401 Oral     SpO2 04/03/20 1401 100 %  Weight 04/03/20 1357 262 lb 5.6 oz (119 kg)     Height 04/03/20 1357 5\' 7"  (1.702 m)     Head Circumference --      Peak Flow --      Pain Score 04/03/20 1401 7     Pain Loc --      Pain Edu? --      Excl. in GC? --     Constitutional: Alert and oriented. Well appearing and in no acute distress. Eyes: Conjunctivae are mildly injected bilaterally.  No discharge.  EOMI.  PERRLA.  No photophobia.  No corneal abrasions or ulcers on fluorescein exam. Head: Atraumatic. Nose: No congestion/rhinnorhea. Mouth/Throat: Mucous membranes are moist.   Neck: Normal range of motion.  Cardiovascular: Good peripheral circulation. Respiratory: Normal respiratory effort.  No retractions.  Gastrointestinal: No distention.  Musculoskeletal:  Extremities warm and well perfused.  Neurologic:  Normal speech and language. No gross focal neurologic deficits are appreciated.  Skin:  Skin is warm and dry. No rash noted. Psychiatric: Mood and affect are normal. Speech and behavior are normal.  ____________________________________________   LABS (all labs ordered are listed, but only abnormal results are displayed)  Labs Reviewed - No data to display ____________________________________________  EKG   ____________________________________________  RADIOLOGY    ____________________________________________   PROCEDURES  Procedure(s) performed: No  Procedures  Critical Care performed: No ____________________________________________   INITIAL IMPRESSION / ASSESSMENT AND PLAN / ED COURSE  Pertinent labs & imaging results that were available during my care of the patient were reviewed by me and considered in my medical decision making (see chart for details).  34 year old female presents with bilateral eye irritation, starting on the right this morning.  She had some discharge earlier and the eye was stuck shut when she awoke.  She reports mild blurred vision to the irritation, but no significant vision changes.  She had no trauma to the eye but was outside in the park yesterday before the symptoms started.  On exam she is well-appearing.  The eyes appear mildly injected but there is no photophobia, swelling, active discharge, or any abnormal uptake on fluorescein exam.  The patient had good relief with tetracaine.  Presentation is consistent with conjunctivitis, most likely allergic but viral, however given the discharge early I cannot rule out bacterial and will treat with Polytrim eyedrops.  I counseled the patient on the results of the work-up.  Return precautions given, she expresses understanding. ____________________________________________   FINAL CLINICAL IMPRESSION(S) / ED DIAGNOSES  Final diagnoses:  Acute  conjunctivitis of both eyes, unspecified acute conjunctivitis type      NEW MEDICATIONS STARTED DURING THIS VISIT:  Discharge Medication List as of 04/03/2020  2:58 PM    START taking these medications   Details  trimethoprim-polymyxin b (POLYTRIM) ophthalmic solution Place 1 drop into both eyes every 4 (four) hours for 5 days., Starting Mon 04/03/2020, Until Sat 04/08/2020, Normal         Note:  This document was prepared using Dragon voice recognition software and may include unintentional dictation errors.   04/10/2020, MD 04/03/20 1530

## 2020-04-03 NOTE — Discharge Instructions (Signed)
You have conjunctivitis which is inflammation of the front of the eye.  It is likely allergic, however you could have a viral or bacterial infection.  We have prescribed eyedrops for you to use for the next 5 to 7 days.  Return to the ER for new, worsening, or persistent severe eye pain, blurry vision, discharge, swelling, or any other new or worsening symptoms that concern you.  We have also provided referral to an eye doctor the you can follow-up with in the next week if you continue have any symptoms.

## 2020-04-20 ENCOUNTER — Other Ambulatory Visit: Payer: Self-pay

## 2020-04-20 ENCOUNTER — Other Ambulatory Visit: Payer: Self-pay | Admitting: Emergency Medicine

## 2020-04-20 ENCOUNTER — Emergency Department
Admission: EM | Admit: 2020-04-20 | Discharge: 2020-04-20 | Disposition: A | Discharge status: Home / Self Care | Payer: Medicaid Other | Attending: Emergency Medicine | Admitting: Emergency Medicine

## 2020-04-20 ENCOUNTER — Ambulatory Visit
Admission: RE | Admit: 2020-04-20 | Discharge: 2020-04-20 | Disposition: A | Discharge status: Home / Self Care | Payer: Medicaid Other | Source: Ambulatory Visit | Attending: Emergency Medicine | Admitting: Emergency Medicine

## 2020-04-20 DIAGNOSIS — Z79899 Other long term (current) drug therapy: Secondary | ICD-10-CM | POA: Insufficient documentation

## 2020-04-20 DIAGNOSIS — R1010 Upper abdominal pain, unspecified: Secondary | ICD-10-CM | POA: Diagnosis present

## 2020-04-20 DIAGNOSIS — R0789 Other chest pain: Secondary | ICD-10-CM | POA: Diagnosis not present

## 2020-04-20 DIAGNOSIS — R079 Chest pain, unspecified: Secondary | ICD-10-CM | POA: Diagnosis present

## 2020-04-20 DIAGNOSIS — F17210 Nicotine dependence, cigarettes, uncomplicated: Secondary | ICD-10-CM | POA: Insufficient documentation

## 2020-04-20 DIAGNOSIS — K2100 Gastro-esophageal reflux disease with esophagitis, without bleeding: Secondary | ICD-10-CM | POA: Diagnosis not present

## 2020-04-20 DIAGNOSIS — B20 Human immunodeficiency virus [HIV] disease: Secondary | ICD-10-CM | POA: Insufficient documentation

## 2020-04-20 DIAGNOSIS — I10 Essential (primary) hypertension: Secondary | ICD-10-CM | POA: Diagnosis not present

## 2020-04-20 LAB — COMPREHENSIVE METABOLIC PANEL
ALT: 16 U/L (ref 0–44)
AST: 25 U/L (ref 15–41)
Albumin: 3.4 g/dL — ABNORMAL LOW (ref 3.5–5.0)
Alkaline Phosphatase: 80 U/L (ref 38–126)
Anion gap: 9 (ref 5–15)
BUN: 9 mg/dL (ref 6–20)
CO2: 23 mmol/L (ref 22–32)
Calcium: 8.6 mg/dL — ABNORMAL LOW (ref 8.9–10.3)
Chloride: 103 mmol/L (ref 98–111)
Creatinine, Ser: 0.86 mg/dL (ref 0.44–1.00)
GFR calc Af Amer: >60 mL/min (ref >60)
GFR calc non Af Amer: >60 mL/min (ref >60)
Glucose, Bld: 120 mg/dL — ABNORMAL HIGH (ref 70–99)
Potassium: 3.3 mmol/L — ABNORMAL LOW (ref 3.5–5.1)
Sodium: 135 mmol/L (ref 135–145)
Total Bilirubin: 0.8 mg/dL (ref 0.3–1.2)
Total Protein: 7.9 g/dL (ref 6.5–8.1)

## 2020-04-20 LAB — CBC WITH DIFFERENTIAL/PLATELET
Abs Immature Granulocytes: 0.01 10*3/uL (ref 0.00–0.07)
Basophils Absolute: 0 10*3/uL (ref 0.0–0.1)
Basophils Relative: 1 %
Eosinophils Absolute: 0.1 10*3/uL (ref 0.0–0.5)
Eosinophils Relative: 2 %
HCT: 35.2 % — ABNORMAL LOW (ref 36.0–46.0)
Hemoglobin: 11.5 g/dL — ABNORMAL LOW (ref 12.0–15.0)
Immature Granulocytes: 0 %
Lymphocytes Relative: 47 %
Lymphs Abs: 2.5 10*3/uL (ref 0.7–4.0)
MCH: 25.6 pg — ABNORMAL LOW (ref 26.0–34.0)
MCHC: 32.7 g/dL (ref 30.0–36.0)
MCV: 78.2 fL — ABNORMAL LOW (ref 80.0–100.0)
Monocytes Absolute: 0.3 10*3/uL (ref 0.1–1.0)
Monocytes Relative: 6 %
Neutro Abs: 2.3 10*3/uL (ref 1.7–7.7)
Neutrophils Relative %: 44 %
Platelets: 344 10*3/uL (ref 150–400)
RBC: 4.5 MIL/uL (ref 3.87–5.11)
RDW: 15.5 % (ref 11.5–15.5)
WBC: 5.3 10*3/uL (ref 4.0–10.5)
nRBC: 0 % (ref 0.0–0.2)

## 2020-04-20 LAB — CK: Total CK: 320 U/L — ABNORMAL HIGH (ref 38–234)

## 2020-04-20 LAB — TROPONIN I (HIGH SENSITIVITY): Troponin I (High Sensitivity): 9 ng/L (ref <18)

## 2020-04-20 MED ORDER — PANTOPRAZOLE SODIUM 40 MG PO TBEC: 40.0000 mg | DELAYED_RELEASE_TABLET | Freq: Every day | ORAL | 0 refills | Status: DC

## 2020-04-20 MED ORDER — SUCRALFATE 1 G PO TABS: 1.0000 g | ORAL_TABLET | Freq: Four times a day (QID) | ORAL | 0 refills | Status: DC

## 2020-04-20 MED ORDER — LIDOCAINE VISCOUS HCL 2 % MT SOLN
15.0000 mL | Freq: Once | OROMUCOSAL | Status: AC
  Administered 2020-04-20: 15 mL via ORAL
  Filled 2020-04-20: qty 15

## 2020-04-20 MED ORDER — ALUM & MAG HYDROXIDE-SIMETH 200-200-20 MG/5ML PO SUSP
30.0000 mL | Freq: Once | ORAL | Status: AC
  Administered 2020-04-20: 30 mL via ORAL
  Filled 2020-04-20: qty 30

## 2020-04-20 NOTE — ED Provider Notes (Signed)
[facility] Emergency Department Provider Note  ____________________________________________   First MD Initiated Contact with Patient 04/20/20 1914     (approximate)  I have reviewed the triage vital signs and the nursing notes.   HISTORY  Chief Complaint Chest Pain    HPI [facility] is a 34 y.o. female  With h/o schizoaffective disorder, substance abuse, here with upper abdominal and chest pain. PT reports that she ate a fried fast food meal last night and began to experience a burning, aching sensation in her upper mid chest radiating up to her throat. She has a h/o GERD and had previously been on something for this, but no longer has it. She took an OTC omeprazole and it did not help her symptoms, so she presents for evaluation. She describes the feeling as a burning, fullness sensation in her upper chest and throat area. Worse with eating. No alleviating factors. No n/v. No cough, SOB, No h/o heart disease.         Past Medical History:  Diagnosis Date  . Asthma   . Bipolar 1 disorder ([place])   . 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 Drug abuse ([place])   . Hepatitis B   . HIV (human immunodeficiency virus infection) ([place])   . Hypertension   . Obese   . Restless leg syndrome   . Schizophrenia [facility])     Patient Active Problem List   Diagnosis Date Noted  . 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 HIV DISEASE 04/27/2008    Past Surgical History:  Procedure Laterality Date  . HERNIA REPAIR    . TUBAL LIGATION    . TYMPANOSTOMY TUBE PLACEMENT      Prior to Admission medications   Medication Sig Start Date End Date Taking? Authorizing Provider  amLODipine (NORVASC) 5 MG tablet Take 1 tablet (5 mg total) by mouth daily. 05/25/19   Lavonia Drafts, MD  hydrochlorothiazide (HYDRODIURIL) 12.5 MG tablet Take 1 tablet (12.5 mg total) by mouth daily. 05/25/19   Lavonia Drafts, MD  meloxicam (MOBIC) 15 MG tablet Take 1 tablet (15 mg total) by mouth  daily. 11/03/19   Cuthriell, Charline Bills, PA-C  pantoprazole (PROTONIX) 40 MG tablet Take 1 tablet (40 mg total) by mouth daily. 04/20/20 05/20/20  Duffy Bruce, MD  sucralfate (CARAFATE) 1 g tablet Take 1 tablet (1 g total) by mouth 4 (four) times daily for 5 days. 04/20/20 04/25/20  Duffy Bruce, MD  traMADol (ULTRAM) 50 MG tablet Take 1 tablet (50 mg total) by mouth every 6 (six) hours as needed. 11/03/19   Cuthriell, Charline Bills, PA-C    Allergies Aspirin  Family History  Problem Relation Age of Onset  . Hypertension Other     Social History Social History   Tobacco Use  . Smoking status: Current Every Day Smoker    Packs/day: 0.10    Types: Cigarettes    Last attempt to quit: 11/26/2007    Years since quitting: 12.4  . Smokeless tobacco: Never Used  Substance Use Topics  . Alcohol use: Yes    Comment: occ  . Drug use: Not Currently    Types: Marijuana    Comment: past abuse    Review of Systems  Review of Systems  Constitutional: Negative for fatigue and fever.  HENT: Negative for congestion and sore throat.   Eyes: Negative for visual disturbance.  Respiratory: Positive for chest tightness. Negative for cough and shortness of breath.   Cardiovascular: Positive for chest pain.  Gastrointestinal: Positive for abdominal pain. Negative for diarrhea, nausea and vomiting.  Genitourinary: Negative for flank pain.  Musculoskeletal: Negative for back pain and neck pain.  Skin: Negative for rash and wound.  Neurological: Negative for weakness.  All other systems reviewed and are negative.    ____________________________________________  PHYSICAL EXAM:      VITAL SIGNS: ED Triage Vitals [04/20/20 1400]  Enc Vitals Group     BP (!) 135/100     Pulse Rate 86     Resp 18     Temp 98.7 F (37.1 C)     Temp Source Oral     SpO2 100 %     Weight      Height      Head Circumference      Peak Flow      Pain Score 0     Pain Loc      Pain Edu?      Excl. in GC?       Physical Exam Vitals and nursing note reviewed.  Constitutional:      General: She is not in acute distress.    Appearance: She is well-developed.  HENT:     Head: Normocephalic and atraumatic.  Eyes:     Conjunctiva/sclera: Conjunctivae normal.  Cardiovascular:     Rate and Rhythm: Normal rate and regular rhythm.     Heart sounds: Normal heart sounds. No murmur. No friction rub.  Pulmonary:     Effort: Pulmonary effort is normal. No respiratory distress.     Breath sounds: Normal breath sounds. No wheezing or rales.  Abdominal:     General: There is no distension.     Palpations: Abdomen is soft.     Tenderness: There is no abdominal tenderness.  Musculoskeletal:     Cervical back: Neck supple.  Skin:    General: Skin is warm.     Capillary Refill: Capillary refill takes less than 2 seconds.  Neurological:     Mental Status: She is alert and oriented to person, place, and time.     Motor: No abnormal muscle tone.       ____________________________________________   LABS (all labs ordered are listed, but only abnormal results are displayed)  Labs Reviewed - No data to display  ____________________________________________  EKG: Normal sinus rhythm, VR 88. PR 160, QRS 84, QTc 423. No acute St elevations or depressions. No ischemia or infarct. ________________________________________  RADIOLOGY All imaging, including plain films, CT scans, and ultrasounds, independently reviewed by me, and interpretations confirmed via formal radiology reads.  ED MD interpretation:   CXR: Clear, no focal abnormality  Official radiology report(s): DG Chest 2 View  Result Date: 04/20/2020 CLINICAL DATA:  Chest pain EXAM: CHEST - 2 VIEW COMPARISON:  Mar 27, 2017. FINDINGS: Lungs are clear. Heart size and pulmonary vascularity are normal. No adenopathy. No pneumothorax. No bone lesions. IMPRESSION: No abnormality noted. Electronically Signed   By: Bretta Bang III M.D.   On:  04/20/2020 14:35    ____________________________________________  PROCEDURES   Procedure(s) performed (including Critical Care):  Procedures  ____________________________________________  INITIAL IMPRESSION / MDM / ASSESSMENT AND PLAN / ED COURSE  As part of my medical decision making, I reviewed the following data within the electronic MEDICAL RECORD NUMBER Nursing notes reviewed and incorporated, Old chart reviewed, Notes from prior ED visits, and [place] Controlled Substance Database       *[facility] was evaluated in Emergency Department on 04/20/2020 for the symptoms described  in the history of present illness. She was evaluated in the context of the global COVID-19 pandemic, which necessitated consideration that the patient might be at risk for infection with the SARS-CoV-2 virus that causes COVID-19. Institutional protocols and algorithms that pertain to the evaluation of patients at risk for COVID-19 are in a state of rapid change based on information released by regulatory bodies including the CDC and federal and state organizations. These policies and algorithms were followed during the patient's care in the ED.  Some ED evaluations and interventions may be delayed as a result of limited staffing during the pandemic.*     Medical Decision Making:  34 yo F here with atypical burning chest pain, which began after eating. EKG nonischemic. Trop neg despite constant and unchanged sx for >6 hours - doubt ACS. She is not tachycardic, tachypneic, hypoxic, and has no cough, SOB, or pleurisy to suggest PE. Abdomen is soft, NT, ND, with no RUQ TTP or signs to suggest cholecystitis, pancreatitis, or hepatitis. Labs support this with reassuring CBC, normal LFTs. Suspect GERD vs gastritis. Will tx with antacids, diet changes, and follow-up.  ____________________________________________  FINAL CLINICAL IMPRESSION(S) / ED DIAGNOSES  Final diagnoses:  Gastroesophageal reflux disease with esophagitis  without hemorrhage  Atypical chest pain     MEDICATIONS GIVEN DURING THIS VISIT:  Medications  alum & mag hydroxide-simeth (MAALOX/MYLANTA) 200-200-20 MG/5ML suspension 30 mL (30 mLs Oral Given 04/20/20 1953)    And  lidocaine (XYLOCAINE) 2 % viscous mouth solution 15 mL (15 mLs Oral Given 04/20/20 1953)     ED Discharge Orders         Ordered    pantoprazole (PROTONIX) 40 MG tablet  Daily     04/20/20 1952    sucralfate (CARAFATE) 1 g tablet  4 times daily     04/20/20 1952           Note:  This document was prepared using Dragon voice recognition software and may include unintentional dictation errors.   Shaune Pollack, MD 04/20/20 2242

## 2020-05-19 ENCOUNTER — Other Ambulatory Visit: Payer: Self-pay

## 2020-05-19 ENCOUNTER — Emergency Department
Admission: EM | Admit: 2020-05-19 | Discharge: 2020-05-19 | Disposition: A | Discharge status: Home / Self Care | Payer: Medicaid Other | Attending: Emergency Medicine | Admitting: Emergency Medicine

## 2020-05-19 ENCOUNTER — Emergency Department: Payer: Medicaid Other

## 2020-05-19 DIAGNOSIS — Z79891 Long term (current) use of opiate analgesic: Secondary | ICD-10-CM | POA: Insufficient documentation

## 2020-05-19 DIAGNOSIS — F17210 Nicotine dependence, cigarettes, uncomplicated: Secondary | ICD-10-CM | POA: Diagnosis not present

## 2020-05-19 DIAGNOSIS — Z79899 Other long term (current) drug therapy: Secondary | ICD-10-CM | POA: Diagnosis not present

## 2020-05-19 DIAGNOSIS — R0789 Other chest pain: Secondary | ICD-10-CM | POA: Insufficient documentation

## 2020-05-19 DIAGNOSIS — F1290 Cannabis use, unspecified, uncomplicated: Secondary | ICD-10-CM | POA: Diagnosis not present

## 2020-05-19 DIAGNOSIS — B20 Human immunodeficiency virus [HIV] disease: Secondary | ICD-10-CM | POA: Insufficient documentation

## 2020-05-19 DIAGNOSIS — R0602 Shortness of breath: Secondary | ICD-10-CM | POA: Diagnosis not present

## 2020-05-19 DIAGNOSIS — J45909 Unspecified asthma, uncomplicated: Secondary | ICD-10-CM | POA: Diagnosis not present

## 2020-05-19 DIAGNOSIS — I10 Essential (primary) hypertension: Secondary | ICD-10-CM | POA: Insufficient documentation

## 2020-05-19 DIAGNOSIS — B1910 Unspecified viral hepatitis B without hepatic coma: Secondary | ICD-10-CM | POA: Insufficient documentation

## 2020-05-19 DIAGNOSIS — Z886 Allergy status to analgesic agent status: Secondary | ICD-10-CM | POA: Insufficient documentation

## 2020-05-19 DIAGNOSIS — R110 Nausea: Secondary | ICD-10-CM | POA: Insufficient documentation

## 2020-05-19 DIAGNOSIS — R079 Chest pain, unspecified: Secondary | ICD-10-CM

## 2020-05-19 DIAGNOSIS — Z791 Long term (current) use of non-steroidal anti-inflammatories (NSAID): Secondary | ICD-10-CM | POA: Diagnosis not present

## 2020-05-19 LAB — CBC
HCT: 37.5 % (ref 36.0–46.0)
Hemoglobin: 12.7 g/dL (ref 12.0–15.0)
MCH: 25.8 pg — ABNORMAL LOW (ref 26.0–34.0)
MCHC: 33.9 g/dL (ref 30.0–36.0)
MCV: 76.2 fL — ABNORMAL LOW (ref 80.0–100.0)
Platelets: 387 10*3/uL (ref 150–400)
RBC: 4.92 MIL/uL (ref 3.87–5.11)
RDW: 16 % — ABNORMAL HIGH (ref 11.5–15.5)
WBC: 7.9 10*3/uL (ref 4.0–10.5)
nRBC: 0 % (ref 0.0–0.2)

## 2020-05-19 LAB — ACETAMINOPHEN LEVEL: Acetaminophen (Tylenol), Serum: <10 ug/mL — ABNORMAL LOW (ref 10–30)

## 2020-05-19 LAB — COMPREHENSIVE METABOLIC PANEL
ALT: 20 U/L (ref 0–44)
AST: 27 U/L (ref 15–41)
Albumin: 3.5 g/dL (ref 3.5–5.0)
Alkaline Phosphatase: 74 U/L (ref 38–126)
Anion gap: 8 (ref 5–15)
BUN: 10 mg/dL (ref 6–20)
CO2: 26 mmol/L (ref 22–32)
Calcium: 8.8 mg/dL — ABNORMAL LOW (ref 8.9–10.3)
Chloride: 102 mmol/L (ref 98–111)
Creatinine, Ser: 0.83 mg/dL (ref 0.44–1.00)
GFR calc Af Amer: >60 mL/min (ref >60)
GFR calc non Af Amer: >60 mL/min (ref >60)
Glucose, Bld: 104 mg/dL — ABNORMAL HIGH (ref 70–99)
Potassium: 3.7 mmol/L (ref 3.5–5.1)
Sodium: 136 mmol/L (ref 135–145)
Total Bilirubin: 0.6 mg/dL (ref 0.3–1.2)
Total Protein: 8.1 g/dL (ref 6.5–8.1)

## 2020-05-19 LAB — TROPONIN I (HIGH SENSITIVITY): Troponin I (High Sensitivity): 5 ng/L (ref <18)

## 2020-05-19 MED ORDER — MORPHINE SULFATE (PF) 4 MG/ML IV SOLN
4.0000 mg | Freq: Once | INTRAVENOUS | Status: AC
  Administered 2020-05-19: 4 mg via INTRAVENOUS
  Filled 2020-05-19: qty 1

## 2020-05-19 MED ORDER — ONDANSETRON HCL 4 MG/2ML IJ SOLN
4.0000 mg | Freq: Once | INTRAMUSCULAR | Status: AC
  Administered 2020-05-19: 4 mg via INTRAVENOUS
  Filled 2020-05-19: qty 2

## 2020-05-19 MED ORDER — SODIUM CHLORIDE 0.9 % IV BOLUS
1000.0000 mL | Freq: Once | INTRAVENOUS | Status: AC
  Administered 2020-05-19: 1000 mL via INTRAVENOUS

## 2020-05-19 NOTE — ED Provider Notes (Signed)
[facility] Emergency Department Provider Note  Time seen: 1:26 PM  I have reviewed the triage vital signs and the nursing notes.   HISTORY  Chief Complaint Chest Pain   HPI [facility] is a 34 y.o. female with a past medical history of asthma, bipolar, HIV, schizophrenia, presents to the emergency department for chest pain.  According to the patient since yesterday she has been experiencing vague intermittent chest discomfort she describes as mild to moderate.  States she also started her menstrual cycle yesterday and has been using menstrual cramp relief medication and took 6 of these pills over the past 24 hours.  Given the chest pain they called 911 who recommended they come to the emergency department via EMS.  Patient's family member was able to obtain a description of the pills she took which contain (tylenol 500mg ; pamabrom 25mg ; pyrilamine maleate 15mg ).  Patient continues to have mild central chest discomfort.  States nausea earlier today but none currently.  Mild shortness of breath earlier today as well.   Past Medical History:  Diagnosis Date  . Asthma   . Bipolar 1 disorder ([facility])   . 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 Drug abuse ([facility])   . Hepatitis B   . HIV (human immunodeficiency virus infection) ([facility])   . Hypertension   . Obese   . Restless leg syndrome   . Schizophrenia [facility])     Patient Active Problem List   Diagnosis Date Noted  . 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 HIV DISEASE 04/27/2008    Past Surgical History:  Procedure Laterality Date  . HERNIA REPAIR    . TUBAL LIGATION    . TYMPANOSTOMY TUBE PLACEMENT      Prior to Admission medications   Medication Sig Start Date End Date Taking? Authorizing Provider  amLODipine (NORVASC) 5 MG tablet Take 1 tablet (5 mg total) by mouth daily. 05/25/19   09/16/2019, MD  hydrochlorothiazide (HYDRODIURIL) 12.5 MG tablet Take 1 tablet (12.5 mg total) by mouth daily. 05/25/19    05/27/19, MD  meloxicam (MOBIC) 15 MG tablet Take 1 tablet (15 mg total) by mouth daily. 11/03/19   Cuthriell, 05/27/19, PA-C  pantoprazole (PROTONIX) 40 MG tablet Take 1 tablet (40 mg total) by mouth daily. 04/20/20 05/20/20  Delorise Royals, MD  sucralfate (CARAFATE) 1 g tablet Take 1 tablet (1 g total) by mouth 4 (four) times daily for 5 days. 04/20/20 04/25/20  Shaune Pollack, MD  traMADol (ULTRAM) 50 MG tablet Take 1 tablet (50 mg total) by mouth every 6 (six) hours as needed. 11/03/19   Cuthriell, 06/25/20, PA-C    Allergies  Allergen Reactions  . Aspirin Itching    Family History  Problem Relation Age of Onset  . Hypertension Other     Social History Social History   Tobacco Use  . Smoking status: Current Every Day Smoker    Packs/day: 0.10    Types: Cigarettes    Last attempt to quit: 11/26/2007    Years since quitting: 12.4  . Smokeless tobacco: Never Used  Substance Use Topics  . Alcohol use: Not Currently    Comment: occ  . Drug use: Not Currently    Types: Marijuana    Comment: past abuse    Review of Systems Constitutional: Negative for fever. Cardiovascular: Positive for chest pain. Respiratory: Negative for shortness of breath. Gastrointestinal: Negative for abdominal pain, vomiting Genitourinary: Negative for urinary compaints Neurological: Negative for headache All other ROS  negative  ____________________________________________   PHYSICAL EXAM:  VITAL SIGNS: ED Triage Vitals  Enc Vitals Group     BP 05/19/20 1241 135/90     Pulse Rate 05/19/20 1239 (!) 104     Resp 05/19/20 1239 (!) 23     Temp 05/19/20 1238 99.5 F (37.5 C)     Temp Source 05/19/20 1238 Oral     SpO2 05/19/20 1239 99 %     Weight 05/19/20 1238 271 lb 2.7 oz (123 kg)     Height 05/19/20 1238 5\' 7"  (1.702 m)     Head Circumference --      Peak Flow --      Pain Score 05/19/20 1238 10     Pain Loc --      Pain Edu? --      Excl. in [place]? --    Constitutional: Alert  and oriented. Well appearing and in no distress. Eyes: Normal exam ENT      Head: Normocephalic and atraumatic.      Mouth/Throat: Mucous membranes are moist. Cardiovascular: Normal rate, regular rhythm.  Respiratory: Normal respiratory effort without tachypnea nor retractions. Breath sounds are clear  Gastrointestinal: Soft and nontender. No distention. Musculoskeletal: Nontender with normal range of motion in all extremities. Neurologic:  Normal speech and language. No gross focal neurologic deficits  Skin:  Skin is warm, dry and intact.  Psychiatric: Mood and affect are normal.   ____________________________________________    EKG  EKG viewed and interpreted by myself shows a normal sinus rhythm at 96 bpm with a narrow QRS, normal axis, normal intervals, no concerning ST changes.  ____________________________________________    RADIOLOGY  X-ray negative  ____________________________________________   INITIAL IMPRESSION / ASSESSMENT AND PLAN / ED COURSE  Pertinent labs & imaging results that were available during my care of the patient were reviewed by me and considered in my medical decision making (see chart for details).   Patient presents emergency department for chest pain as well as possible medication overdose of 6 tablets of menstrual release medication.  It appears the separate only contains 500 mg of acetaminophen, 6 tablets were taken no for 24 hours, should not pose a danger to the patient.  Will obtain an acetaminophen level as well as LFTs as a precaution.  We will continue to closely monitor.  We will check additional labs including cardiac enzymes, EKG and chest x-ray.  Patient's work-up is reassuring including negative troponin.  LFTs are normal.  Acetaminophen level is normal.  Overall patient appears well, better after pain medication.  We will discharge patient home with PCP follow-up.  Patient and family member agreeable to plan of care.  [facility] was  evaluated in Emergency Department on 05/19/2020 for the symptoms described in the history of present illness. She was evaluated in the context of the global COVID-19 pandemic, which necessitated consideration that the patient might be at risk for infection with the SARS-CoV-2 virus that causes COVID-19. Institutional protocols and algorithms that pertain to the evaluation of patients at risk for COVID-19 are in a state of rapid change based on information released by regulatory bodies including the CDC and federal and state organizations. These policies and algorithms were followed during the patient's care in the ED.  ____________________________________________   FINAL CLINICAL IMPRESSION(S) / ED DIAGNOSES  Chest pain   Harvest Dark, MD 05/19/20 [phone]

## 2020-05-19 NOTE — ED Notes (Addendum)
Family member at bedside. Pt has a blanket. Room left cool per pt's request.

## 2020-05-19 NOTE — ED Notes (Signed)
Blue, lt grn, lav tubes sent to lab. Resp reg/unlabored currently. Pt states last time seen for CP was told it was reflux related but states this time it feels different. Denies fever. States has experienced chills and nausea at times.

## 2020-05-19 NOTE — ED Notes (Signed)
Joni Reining NT completed EKG.

## 2020-05-19 NOTE — ED Notes (Signed)
EDP Paduchowski at bedside. Pt states reason she refused nitro was "because of how rude he was to me" referring to EMS staff and that he "did not explain to me why I might need it".

## 2020-05-19 NOTE — ED Notes (Signed)
EDP Paduchowski at bedside as pt c/o in CP again.

## 2020-05-19 NOTE — ED Triage Notes (Signed)
Pt in via EMS from group home with c/o CP/SOB. L-sided without radiation. 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 149/117 BP with EMS. Pt refused nitro and has allergy to ASA per EMS.

## 2020-12-06 IMAGING — CR DG CHEST 2V
1 series · 2 of 2 positions shown · non-contrast
Comparison: March 27, 2017.

CLINICAL DATA: Chest pain

EXAM:
CHEST - 2 VIEW

[Series 1: dg chest 2 view · 0.14mm/px · 2 of 2 slices shown]
[im 1/2]
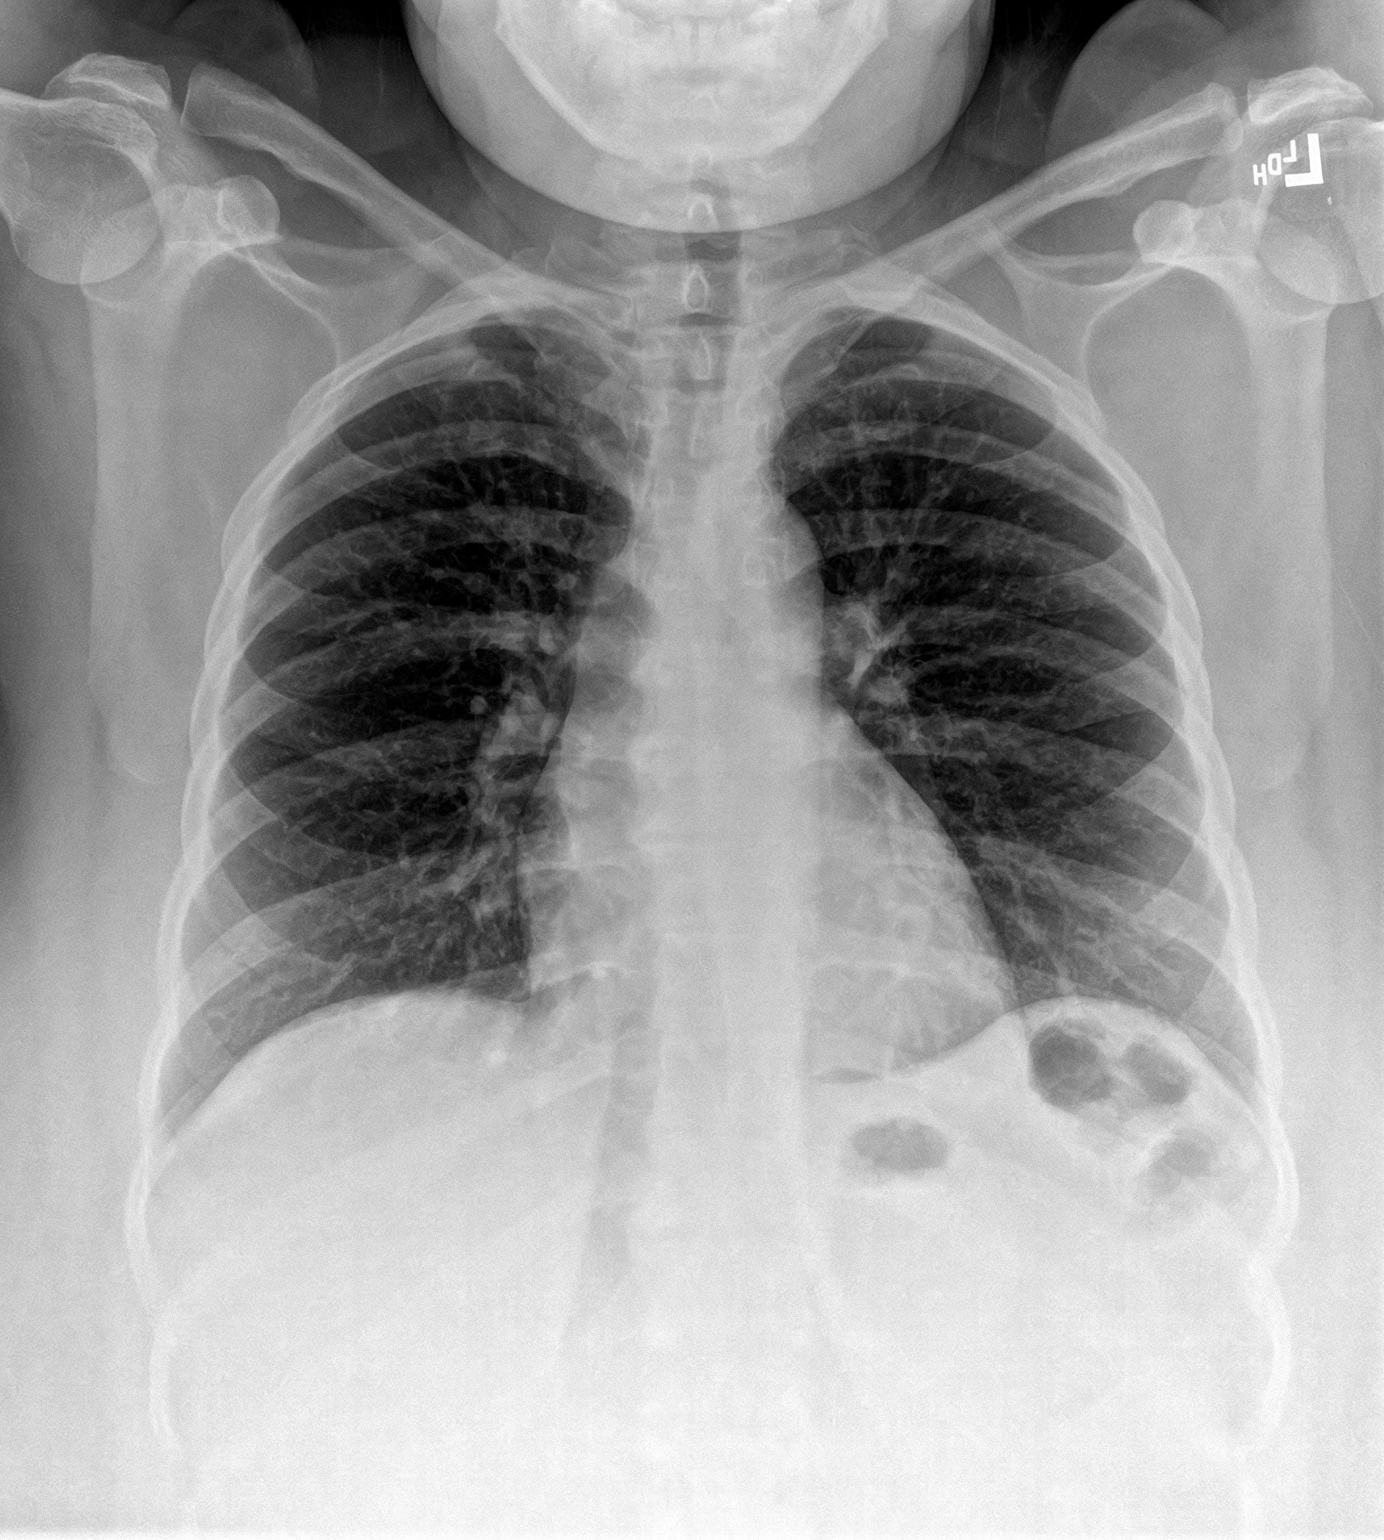
[im 2/2]
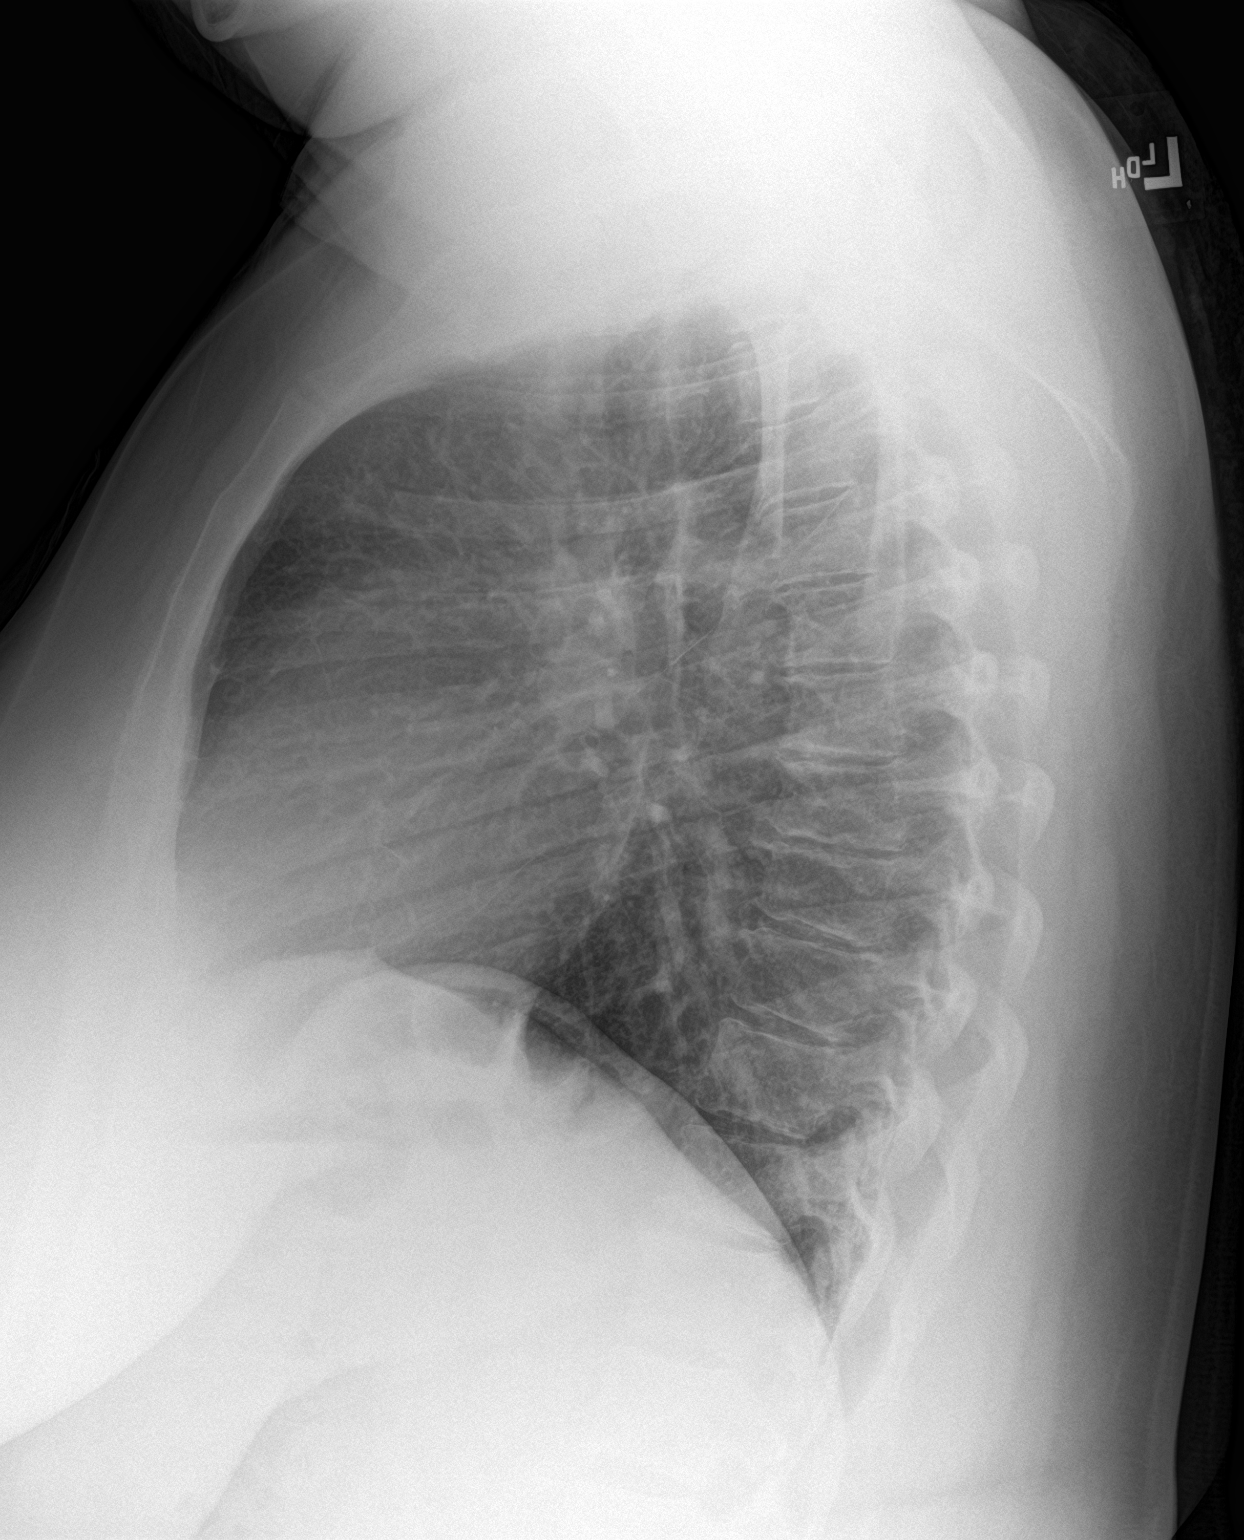

[2 of 2 positions shown; findings below may reference images not displayed]

FINDINGS: Lungs are clear. Heart size and pulmonary vascularity are normal. No
adenopathy. No pneumothorax. No bone lesions.
IMPRESSION: No abnormality noted.

## 2020-12-13 DIAGNOSIS — R519 Headache, unspecified: Secondary | ICD-10-CM | POA: Diagnosis present

## 2020-12-13 DIAGNOSIS — Z21 Asymptomatic human immunodeficiency virus [HIV] infection status: Secondary | ICD-10-CM | POA: Diagnosis not present

## 2020-12-13 DIAGNOSIS — I10 Essential (primary) hypertension: Secondary | ICD-10-CM | POA: Diagnosis not present

## 2020-12-13 DIAGNOSIS — K047 Periapical abscess without sinus: Secondary | ICD-10-CM | POA: Insufficient documentation

## 2020-12-13 DIAGNOSIS — Z79899 Other long term (current) drug therapy: Secondary | ICD-10-CM | POA: Diagnosis not present

## 2020-12-13 DIAGNOSIS — J45909 Unspecified asthma, uncomplicated: Secondary | ICD-10-CM | POA: Insufficient documentation

## 2020-12-13 DIAGNOSIS — F17210 Nicotine dependence, cigarettes, uncomplicated: Secondary | ICD-10-CM | POA: Diagnosis not present

## 2020-12-14 ENCOUNTER — Emergency Department
Admission: EM | Admit: 2020-12-14 | Discharge: 2020-12-14 | Disposition: A | Discharge status: Home / Self Care | Payer: BC Managed Care – PPO | Attending: Emergency Medicine | Admitting: Emergency Medicine

## 2020-12-14 ENCOUNTER — Other Ambulatory Visit: Payer: Self-pay

## 2020-12-14 DIAGNOSIS — K047 Periapical abscess without sinus: Secondary | ICD-10-CM

## 2020-12-14 DIAGNOSIS — K0889 Other specified disorders of teeth and supporting structures: Secondary | ICD-10-CM

## 2020-12-14 MED ORDER — BUPIVACAINE HCL (PF) 0.5 % IJ SOLN
INTRAMUSCULAR | Status: AC
  Administered 2020-12-14: 50 mL
  Filled 2020-12-14: qty 30

## 2020-12-14 MED ORDER — BUPIVACAINE HCL 0.5 % IJ SOLN
50.0000 mL | Freq: Once | INTRAMUSCULAR | Status: AC
  Filled 2020-12-14: qty 50

## 2020-12-14 MED ORDER — PENICILLIN V POTASSIUM 500 MG PO TABS: 500.0000 mg | ORAL_TABLET | Freq: Four times a day (QID) | ORAL | 0 refills | Status: AC

## 2020-12-14 NOTE — ED Notes (Signed)
No answer when called several times from lobby 

## 2020-12-14 NOTE — ED Provider Notes (Signed)
[facility] Emergency Department Provider Note   ____________________________________________   I have reviewed the triage vital signs and the nursing notes.   HISTORY  Chief Complaint Facial Pain   History limited by: Not Limited   HPI Penny Young is a 35 y.o. female who presents to the emergency department today because of concerns for right facial pain.  Patient states that the pain has been there for roughly 1 week.  It started in her right lower jaw but has now spread to occupy most of her right face.  The pain is severe.  She denies history of bad dental infection in the past although has not had access to consistent dental care.  Records reviewed. Per medical record review patient has a history of hypertension, bipolar.   Past Medical History:  Diagnosis Date  . Asthma   . Bipolar 1 disorder (HCC)   . 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 Drug abuse (HCC)   . Hepatitis B   . HIV (human immunodeficiency virus infection) (HCC)   . Hypertension   . Obese   . Restless leg syndrome   . Schizophrenia [facility])     Patient Active Problem List   Diagnosis Date Noted  . 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 HIV DISEASE 04/27/2008    Past Surgical History:  Procedure Laterality Date  . HERNIA REPAIR    . TUBAL LIGATION    . TYMPANOSTOMY TUBE PLACEMENT      Prior to Admission medications   Medication Sig Start Date End Date Taking? Authorizing Provider  amLODipine (NORVASC) 5 MG tablet Take 1 tablet (5 mg total) by mouth daily. 05/25/19   Jene Every, MD  BIKTARVY 50-200-25 MG TABS tablet Take 1 tablet by mouth daily. 05/03/20   [provider]  DARAPRIM 25 MG tablet Take 50 mg by mouth daily. 05/03/20   [provider]  hydrochlorothiazide (HYDRODIURIL) 12.5 MG tablet Take 1 tablet (12.5 mg total) by mouth daily. 05/25/19   Jene Every, MD  leucovorin (WELLCOVORIN) 25 MG tablet Take 25 mg by mouth at bedtime. 05/03/20   [provider]  pantoprazole (PROTONIX) 40 MG tablet Take 1 tablet (40 mg total) by mouth daily. 04/20/20 05/20/20  Shaune Pollack, MD  sulfaDIAZINE 500 MG tablet Take 1,000 mg by mouth 3 (three) times daily. 05/03/20   [provider]    Allergies Aspirin  Family History  Problem Relation Age of Onset  . Hypertension Other     Social History Social History   Tobacco Use  . Smoking status: Current Every Day Smoker    Packs/day: 0.10    Types: Cigarettes    Last attempt to quit: 11/26/2007    Years since quitting: 13.0  . Smokeless tobacco: Never Used  Substance Use Topics  . Alcohol use: Not Currently    Comment: occ  . Drug use: Not Currently    Types: Marijuana    Comment: past abuse    Review of Systems Constitutional: No fever/chills Eyes: No visual changes. ENT: Positive for right lower jaw and right facial pain. Cardiovascular: Denies chest pain. Respiratory: Denies shortness of breath. Gastrointestinal: No abdominal pain.  No nausea, no vomiting.  No diarrhea.   Genitourinary: Negative for dysuria. Musculoskeletal: Negative for back pain. Skin: Negative for rash. Neurological: Negative for headaches, focal weakness or numbness.  ____________________________________________   PHYSICAL EXAM:  VITAL SIGNS: ED Triage Vitals  Enc Vitals Group     BP 12/14/20 0007 120/77  Pulse Rate 12/14/20 0007 64     Resp 12/14/20 0007 18     Temp 12/14/20 0007 98.8 F (37.1 C)     Temp Source 12/14/20 0007 Oral     SpO2 12/14/20 0007 98 %     Weight 12/14/20 0008 230 lb (104.3 kg)     Height 12/14/20 0008 5\' 7"  (1.702 m)     Head Circumference --      Peak Flow --      Pain Score 12/14/20 0008 7   Constitutional: Alert and oriented.  Eyes: Conjunctivae are normal.  ENT      Head: Normocephalic and atraumatic.      Nose: No congestion/rhinnorhea.      Mouth/Throat: Mucous membranes are moist.      Neck: No  stridor. Hematological/Lymphatic/Immunilogical: No cervical lymphadenopathy. Cardiovascular: Normal rate, regular rhythm.  No murmurs, rubs, or gallops.  Respiratory: Normal respiratory effort without tachypnea nor retractions. Breath sounds are clear and equal bilaterally. No wheezes/rales/rhonchi. Gastrointestinal: Soft and non tender. No rebound. No guarding.  Genitourinary: Deferred Musculoskeletal: Normal range of motion in all extremities.  Neurologic:  Normal speech and language. No gross focal neurologic deficits are appreciated.  Skin:  Skin is warm, dry and intact. No rash noted. Psychiatric: Mood and affect are normal. Speech and behavior are normal. Patient exhibits appropriate insight and judgment.  ____________________________________________    LABS (pertinent positives/negatives)  None  ____________________________________________   EKG  None  ____________________________________________    RADIOLOGY  None  ____________________________________________   PROCEDURES  Procedures  ____________________________________________   INITIAL IMPRESSION / ASSESSMENT AND PLAN / ED COURSE  Pertinent labs & imaging results that were available during my care of the patient were reviewed by me and considered in my medical decision making (see chart for details).   Patient presented to the emergency department today because of concerns for right facial pain.  Has been going on for a week.  Do have concern for possible dental infection.  I discussed this with the patient.  Did place bupivacaine which helped numb and relieve the pain.  Will plan on discharging with antibiotics and give dental follow-up information.  ____________________________________________   FINAL CLINICAL IMPRESSION(S) / ED DIAGNOSES  Final diagnoses:  Pain, dental  Dental infection     Note: This dictation was prepared with Dragon dictation. Any transcriptional errors that result from this  process are unintentional     12/16/20, MD 12/14/20 [id number]

## 2020-12-14 NOTE — Discharge Instructions (Addendum)
OPTIONS FOR DENTAL FOLLOW UP CARE ° °[place] Department of Health and Human Services - Local Safety Net Dental Clinics °http://www.ncdhhs.gov/dph/oralhealth/services/safetynetclinics.htm °  °[facility] (336-562-3123) ° °[facility] [place] ([phone]) ° °[facility] ([phone] ext [phone]) ° °[facility] ([phone]) ° °[facility] ([phone]) °This clinic caters to the indigent population and is on a lottery system. °Location: °[facility], [address], [place] °Clinic Hours: °Wednesdays from 6pm - 9pm, patients seen by a lottery system. °For dates, call or go to [email or web address]/[facility]/[email or web address] °Services: °Cleanings, fillings and simple extractions. °Payment Options: °DENTAL WORK IS FREE OF CHARGE. Bring proof of income or support. °Best way to get seen: °Arrive at 5:15 pm - this is a lottery, NOT first come/first serve, so arriving earlier will not increase your chances of being seen. °  °  °[facility] °[phone] °Select option 1 for emergencies °  °Location: °[facility], [address], [place] °Clinic Hours: °No walk-ins accepted - call the day before to schedule an appointment. °Check in times are 9:30 am and 1:30 pm. °Services: °Simple extractions, temporary fillings, pulpectomy/pulp debridement, uncomplicated abscess drainage. °Payment Options: °PAYMENT IS DUE AT THE TIME OF SERVICE.  Fee is usually $100-200, additional surgical procedures (e.g. abscess drainage) may be extra. °Cash, checks, Visa/MasterCard accepted.  Can file Medicaid if patient is covered for dental - patient should call case worker to check. °No discount for [facility] patients. °Best way to get seen: °MUST call the day before and get onto the schedule. Can usually be seen the next 1-2 days. No walk-ins accepted. °  °  °[facility] °[phone] °   °Location: °[facility], [address], [place] °Clinic Hours: °M, W, Th, F 8am or 1:30pm, Tues 9a or 1:30 - first come/first served. °Services: °Simple extractions, temporary fillings, uncomplicated abscess drainage.  You do not need to be an [place] resident. °Payment Options: °PAYMENT IS DUE AT THE TIME OF SERVICE. °Dental insurance, otherwise sliding scale - bring proof of income or support. °Depending on income and treatment needed, cost is usually $50-200. °Best way to get seen: °Arrive early as it is first come/first served. °  °  °[facility] °[phone] °  °Location: °[address] °Clinic Hours: °Mon-Thu 8a-5p °Services: °Most basic dental services including extractions and fillings. °Payment Options: °PAYMENT IS DUE AT THE TIME OF SERVICE. °Sliding scale, up to 50% off - bring proof if income or support. °Medicaid with dental option accepted. °Best way to get seen: °Call to schedule an appointment, can usually be seen within 2 weeks OR they will try to see walk-ins - show up at 8a or 2p (you may have to wait). °  °  °[facility] °[phone] °[place] RESIDENTS ONLY °  °Location: °[facility], [address], [place], [place] [address] °Clinic Hours: By appointment only. °Monday - Thursday 8am-5pm, Friday 8am-12pm °Services: Cleanings, fillings, extractions. °Payment Options: °PAYMENT IS DUE AT THE TIME OF SERVICE. °Cash, Visa or MasterCard. Sliding scale - $30 minimum per service. °Best way to get seen: °Come in to office, complete packet and make an appointment - need proof of income °or support monies for each household member and proof of [place] residence. °Usually takes about a month to get in. °  °  °[facility] °[phone] °  °Location: °[address]   [place] °Clinic Hours: Walk-in Urgent Care Dental Services are offered Monday-Friday  mornings only. °The numbers of emergencies accepted daily is limited to the number of °providers available. °Maximum 15 - Mondays, Wednesdays & Thursdays °Maximum 10 - Tuesdays & Fridays °Services: °You do not need to be a [place] resident to be seen for a dental emergency. °Emergencies are defined as pain, swelling, abnormal bleeding, or dental trauma. Walkins will receive x-rays if needed. °NOTE: Dental cleaning is not an emergency. °Payment Options: °PAYMENT IS DUE AT THE TIME OF SERVICE. °Minimum co-pay is $40.00 for uninsured patients. °Minimum co-pay is $3.00 for Medicaid with dental coverage. °Dental Insurance is accepted and must be presented at time of visit. °Medicare does not cover dental. °Forms of payment: Cash, credit card, checks. °Best way to get seen: °If not previously registered with the clinic, walk-in dental registration begins at 7:15 am and is on a first come/first serve basis. °If previously registered with the clinic, call to make an appointment. °  °  °[facility] °[phone] °[place] RESIDENTS ONLY °  °Location: °[address], [place], [place] °Clinic Hours: °Mon-Thu 10a-2p °Services: Extractions only! °Payment Options: °FREE (donations accepted) - bring proof of income or support °Best way to get seen: °Call and schedule an appointment OR come at 8am on the 1st Monday of every month (except for holidays) when it is first come/first served. °  °  °[facility] °[phone] °  °Location: °[address], [place] °Clinic Hours: °Friday mornings °Services, Payment Options, Best way to get seen: °Call for info °

## 2020-12-14 NOTE — ED Notes (Signed)
Pt returns to lobby, st was outside in car

## 2020-12-14 NOTE — ED Triage Notes (Signed)
Pt in with co right sided facial pain for over a week. Unsure if it is dental pain.

## 2021-09-26 ENCOUNTER — Encounter (HOSPITAL_BASED_OUTPATIENT_CLINIC_OR_DEPARTMENT_OTHER): Payer: Self-pay | Admitting: Obstetrics and Gynecology

## 2021-11-29 ENCOUNTER — Emergency Department (HOSPITAL_BASED_OUTPATIENT_CLINIC_OR_DEPARTMENT_OTHER): Payer: Medicaid Other

## 2021-11-29 ENCOUNTER — Other Ambulatory Visit: Payer: Self-pay

## 2021-11-29 ENCOUNTER — Encounter (HOSPITAL_BASED_OUTPATIENT_CLINIC_OR_DEPARTMENT_OTHER): Payer: Self-pay | Admitting: Emergency Medicine

## 2021-11-29 ENCOUNTER — Emergency Department (HOSPITAL_BASED_OUTPATIENT_CLINIC_OR_DEPARTMENT_OTHER)
Admission: EM | Admit: 2021-11-29 | Discharge: 2021-11-29 | Disposition: A | Discharge status: Home / Self Care | Payer: Medicaid Other | Attending: Emergency Medicine | Admitting: Emergency Medicine

## 2021-11-29 DIAGNOSIS — Z21 Asymptomatic human immunodeficiency virus [HIV] infection status: Secondary | ICD-10-CM | POA: Insufficient documentation

## 2021-11-29 DIAGNOSIS — I10 Essential (primary) hypertension: Secondary | ICD-10-CM | POA: Insufficient documentation

## 2021-11-29 DIAGNOSIS — Z20822 Contact with and (suspected) exposure to covid-19: Secondary | ICD-10-CM | POA: Insufficient documentation

## 2021-11-29 DIAGNOSIS — J45909 Unspecified asthma, uncomplicated: Secondary | ICD-10-CM | POA: Insufficient documentation

## 2021-11-29 DIAGNOSIS — Z79899 Other long term (current) drug therapy: Secondary | ICD-10-CM | POA: Diagnosis not present

## 2021-11-29 DIAGNOSIS — R059 Cough, unspecified: Secondary | ICD-10-CM | POA: Diagnosis present

## 2021-11-29 DIAGNOSIS — B349 Viral infection, unspecified: Secondary | ICD-10-CM | POA: Insufficient documentation

## 2021-11-29 LAB — RESP PANEL BY RT-PCR (FLU A&B, COVID) ARPGX2
Influenza A by PCR: NEGATIVE
Influenza B by PCR: NEGATIVE
SARS Coronavirus 2 by RT PCR: NEGATIVE

## 2021-11-29 NOTE — Discharge Instructions (Addendum)
You have been evaluated for your symptoms.  Fortunately your chest x-ray did not show any signs of pneumonia, you test negative for COVID and flu.  Your symptom is still likely to be a viral infection.  Please take over-the-counter medication as needed, return if you have any concern.

## 2021-11-29 NOTE — ED Triage Notes (Signed)
Cough and runny nose x 2 days , not feeling good

## 2021-11-29 NOTE — ED Provider Notes (Signed)
[facility] EMERGENCY DEPT Provider Note   CSN: [id number] Arrival date & time: 11/29/21  1335     History  Chief Complaint  Patient presents with   Cough    Penny Young is a 36 y.o. female.  The history is provided by the patient. No language interpreter was used.  Cough  36 year old female presenting with cold symptoms.  Home Medications Prior to Admission medications   Medication Sig Start Date End Date Taking? Authorizing Provider  amLODipine (NORVASC) 5 MG tablet Take 1 tablet (5 mg total) by mouth daily. 05/25/19   Jene Every, MD  BIKTARVY 50-200-25 MG TABS tablet Take 1 tablet by mouth daily. 05/03/20   [provider]  DARAPRIM 25 MG tablet Take 50 mg by mouth daily. 05/03/20   [provider]  hydrochlorothiazide (HYDRODIURIL) 12.5 MG tablet Take 1 tablet (12.5 mg total) by mouth daily. 05/25/19   Jene Every, MD  leucovorin (WELLCOVORIN) 25 MG tablet Take 25 mg by mouth at bedtime. 05/03/20   [provider]  pantoprazole (PROTONIX) 40 MG tablet Take 1 tablet (40 mg total) by mouth daily. 04/20/20 05/20/20  Shaune Pollack, MD  sulfaDIAZINE 500 MG tablet Take 1,000 mg by mouth 3 (three) times daily. 05/03/20   [provider]      Allergies    Aspirin    Review of Systems   Review of Systems  Respiratory:  Positive for cough.   All other systems reviewed and are negative.  Physical Exam Updated Vital Signs BP (!) 140/96 (BP Location: Right Arm)    Pulse 85    Temp 98.4 F (36.9 C)    Resp 18    Ht 5\' 7"  (1.702 m)    Wt 94.3 kg    SpO2 100%    BMI 32.58 kg/m  Physical Exam Vitals and nursing note reviewed.  Constitutional:      General: She is not in acute distress.    Appearance: She is well-developed.  HENT:     Head: Atraumatic.  Eyes:     Conjunctiva/sclera: Conjunctivae normal.  Cardiovascular:     Rate and Rhythm: Normal rate and regular rhythm.     Pulses: Normal pulses.     Heart sounds: Normal  heart sounds.  Pulmonary:     Effort: Pulmonary effort is normal.  Abdominal:     Palpations: Abdomen is soft.     Tenderness: There is no abdominal tenderness.  Musculoskeletal:     Cervical back: Neck supple.  Skin:    Findings: No rash.  Neurological:     Mental Status: She is alert. Mental status is at baseline.  Psychiatric:        Mood and Affect: Mood normal.    ED Results / Procedures / Treatments   Labs (all labs ordered are listed, but only abnormal results are displayed) Labs Reviewed  RESP PANEL BY RT-PCR (FLU A&B, COVID) [id number]    EKG None  Radiology DG Chest Portable 1 View  Result Date: 11/29/2021 CLINICAL DATA:  Per triage note: Cough and runny nose x 2 days , not feeling good Pending Respiratory panel EXAM: PORTABLE CHEST - 1 VIEW COMPARISON:  05/19/2020 FINDINGS: Lungs are clear. Heart size and mediastinal contours are within normal limits. No effusion. The patient is skeletally immature. Visualized bones unremarkable. IMPRESSION: No acute cardiopulmonary disease. Electronically Signed   By: 05/21/2020 M.D.   On: 11/29/2021 17:42    Procedures Procedures    Medications Ordered in ED  Medications - No data to display  ED Course/ Medical Decision Making/ A&P                           Medical Decision Making  BP (!) 140/96 (BP Location: Right Arm)    Pulse 85    Temp 98.4 F (36.9 C)    Resp 18    Ht 5\' 7"  (1.702 m)    Wt 94.3 kg    SpO2 100%    BMI 32.58 kg/m   4:52 PM This is a 36 year old female with significant past medical history pertinent for HIV, bipolar, asthma, hypertension, schizophrenia who presents with cold symptoms.  Patient report for the past several days she has had a nonproductive cough, some sinus congestion, overall not feeling well.  Symptoms started about 5 days ago.  She noticed it after she had her haircut.  She initially endorse some fever but that has since resolved.  She report her cough did improve with cough medication.  She  has been compliant with her HIV medication.  She denies any nausea vomiting or diarrhea no shortness of breath no neck stiffness or rash.  She denies any recent sick contact.  On exam, this is a well-appearing female speaking in complete sentences, appears to be in no acute discomfort.  Heart and lung sounds normal, abdomen soft nontender, vital signs shows no hypoxia and patient is afebrile.  She does not recall her last CD4 count or viral load but states that last time she had it checked last year was within normal limit and viral load was undetectable.  Plan to obtain chest x-ray as well as viral respiratory panel for further assessment.  6:56 PM Labs and imaging was independently reviewed and interpreted by me.  A viral respiratory panel obtained showing negative for COVID and flu.  Chest x-ray shows no acute cardiopulmonary disease.  Patient overall stable to be discharged home.  Suspect viral illness.  Return precaution given.  Work note provided.  [name] was evaluated in Emergency Department on 11/29/2021 for the symptoms described in the history of present illness. She was evaluated in the context of the global COVID-19 pandemic, which necessitated consideration that the patient might be at risk for infection with the SARS-CoV-2 virus that causes COVID-19. Institutional protocols and algorithms that pertain to the evaluation of patients at risk for COVID-19 are in a state of rapid change based on information released by regulatory bodies including the CDC and federal and state organizations. These policies and algorithms were followed during the patient's care in the ED.         Final Clinical Impression(s) / ED Diagnoses Final diagnoses:  Viral illness    Rx / DC Orders ED Discharge Orders     None         01/27/2022, PA-C 11/29/21 1901    01/27/22, MD 12/06/21 2342

## 2022-02-23 ENCOUNTER — Emergency Department (HOSPITAL_COMMUNITY): Payer: Medicaid Other

## 2022-02-23 ENCOUNTER — Encounter (HOSPITAL_COMMUNITY): Payer: Self-pay

## 2022-02-23 ENCOUNTER — Emergency Department (HOSPITAL_COMMUNITY)
Admission: EM | Admit: 2022-02-23 | Discharge: 2022-02-23 | Disposition: A | Discharge status: Home / Self Care | Payer: Medicaid Other | Attending: Emergency Medicine | Admitting: Emergency Medicine

## 2022-02-23 ENCOUNTER — Other Ambulatory Visit: Payer: Self-pay

## 2022-02-23 DIAGNOSIS — R102 Pelvic and perineal pain: Secondary | ICD-10-CM | POA: Diagnosis present

## 2022-02-23 DIAGNOSIS — R1032 Left lower quadrant pain: Secondary | ICD-10-CM | POA: Insufficient documentation

## 2022-02-23 DIAGNOSIS — N9489 Other specified conditions associated with female genital organs and menstrual cycle: Secondary | ICD-10-CM | POA: Insufficient documentation

## 2022-02-23 DIAGNOSIS — R110 Nausea: Secondary | ICD-10-CM | POA: Diagnosis not present

## 2022-02-23 DIAGNOSIS — Z79899 Other long term (current) drug therapy: Secondary | ICD-10-CM | POA: Diagnosis not present

## 2022-02-23 DIAGNOSIS — Z21 Asymptomatic human immunodeficiency virus [HIV] infection status: Secondary | ICD-10-CM | POA: Insufficient documentation

## 2022-02-23 LAB — URINALYSIS, ROUTINE W REFLEX MICROSCOPIC
Bilirubin Urine: NEGATIVE
Glucose, UA: NEGATIVE mg/dL
Ketones, ur: NEGATIVE mg/dL
Leukocytes,Ua: NEGATIVE
Nitrite: NEGATIVE
Protein, ur: 30 mg/dL — AB
Specific Gravity, Urine: 1.018 (ref 1.005–1.030)
pH: 6 (ref 5.0–8.0)

## 2022-02-23 LAB — CBC WITH DIFFERENTIAL/PLATELET
Abs Immature Granulocytes: 0.02 10*3/uL (ref 0.00–0.07)
Basophils Absolute: 0 10*3/uL (ref 0.0–0.1)
Basophils Relative: 0 %
Eosinophils Absolute: 0.1 10*3/uL (ref 0.0–0.5)
Eosinophils Relative: 1 %
HCT: 38.9 % (ref 36.0–46.0)
Hemoglobin: 12.9 g/dL (ref 12.0–15.0)
Immature Granulocytes: 0 %
Lymphocytes Relative: 31 %
Lymphs Abs: 2.1 10*3/uL (ref 0.7–4.0)
MCH: 28.2 pg (ref 26.0–34.0)
MCHC: 33.2 g/dL (ref 30.0–36.0)
MCV: 85.1 fL (ref 80.0–100.0)
Monocytes Absolute: 0.4 10*3/uL (ref 0.1–1.0)
Monocytes Relative: 6 %
Neutro Abs: 4.2 10*3/uL (ref 1.7–7.7)
Neutrophils Relative %: 62 %
Platelets: 296 10*3/uL (ref 150–400)
RBC: 4.57 MIL/uL (ref 3.87–5.11)
RDW: 15.4 % (ref 11.5–15.5)
WBC: 6.9 10*3/uL (ref 4.0–10.5)
nRBC: 0 % (ref 0.0–0.2)

## 2022-02-23 LAB — COMPREHENSIVE METABOLIC PANEL
ALT: 14 U/L (ref 0–44)
AST: 18 U/L (ref 15–41)
Albumin: 3.5 g/dL (ref 3.5–5.0)
Alkaline Phosphatase: 57 U/L (ref 38–126)
Anion gap: 7 (ref 5–15)
BUN: 12 mg/dL (ref 6–20)
CO2: 24 mmol/L (ref 22–32)
Calcium: 9 mg/dL (ref 8.9–10.3)
Chloride: 105 mmol/L (ref 98–111)
Creatinine, Ser: 0.73 mg/dL (ref 0.44–1.00)
GFR, Estimated: >60 mL/min (ref >60)
Glucose, Bld: 98 mg/dL (ref 70–99)
Potassium: 3.9 mmol/L (ref 3.5–5.1)
Sodium: 136 mmol/L (ref 135–145)
Total Bilirubin: 0.3 mg/dL (ref 0.3–1.2)
Total Protein: 7.9 g/dL (ref 6.5–8.1)

## 2022-02-23 LAB — HCG, QUANTITATIVE, PREGNANCY: hCG, Beta Chain, Quant, S: <1 m[IU]/mL (ref <5)

## 2022-02-23 MED ORDER — DICLOFENAC SODIUM 50 MG PO TBEC: 50.0000 mg | DELAYED_RELEASE_TABLET | Freq: Two times a day (BID) | ORAL | 0 refills | Status: DC

## 2022-02-23 MED ORDER — DICLOFENAC SODIUM 50 MG PO TBEC: 50.0000 mg | DELAYED_RELEASE_TABLET | Freq: Two times a day (BID) | ORAL | 0 refills | Status: AC

## 2022-02-23 MED ORDER — SODIUM CHLORIDE 0.9 % IV BOLUS
1000.0000 mL | Freq: Once | INTRAVENOUS | Status: AC
  Administered 2022-02-23: 1000 mL via INTRAVENOUS

## 2022-02-23 MED ORDER — KETOROLAC TROMETHAMINE 15 MG/ML IJ SOLN
7.5000 mg | Freq: Once | INTRAMUSCULAR | Status: AC
  Administered 2022-02-23: 7.5 mg via INTRAVENOUS
  Filled 2022-02-23: qty 1

## 2022-02-23 MED ORDER — HYDROMORPHONE HCL 1 MG/ML IJ SOLN
0.5000 mg | INTRAMUSCULAR | Status: DC | PRN
  Administered 2022-02-23: 0.5 mg via INTRAVENOUS
  Filled 2022-02-23: qty 1

## 2022-02-23 MED ORDER — ONDANSETRON HCL 4 MG/2ML IJ SOLN
4.0000 mg | Freq: Once | INTRAMUSCULAR | Status: AC
  Administered 2022-02-23: 4 mg via INTRAVENOUS
  Filled 2022-02-23: qty 2

## 2022-02-23 NOTE — ED Triage Notes (Signed)
EMS reports from home, c/o Left lower abdominal pain and noted rebound tenderness, x 45 minutes. Pt reports menses began this morning. ? ?BP 132/68 ?HR 80 ?RR 18 ?Sp02 98 RA ?CBG 112 ?

## 2022-02-23 NOTE — ED Provider Notes (Signed)
?[facility]-EMERGENCY DEPT ?Provider Note ? ? ?CSN: [id number] ?Arrival date & time: 02/23/22  1335 ? ?  ? ?History ? ?Chief Complaint  ?Patient presents with  ? Abdominal Pain  ? Nausea  ? ? ?Penny Young is a 36 y.o. female. ? ?36 year old female with past medical history of HIV, bipolar disorder, schizophrenia, presents with complaint of left lower pelvic pain onset about 1 hour prior to arrival.  Reports sudden onset of sharp pain in her left pelvis, does not radiate, is associated with nausea without vomiting.  Denies changes in bowel or bladder habits, fevers, chills, abnormal vaginal discharge. LMP today. ? ? ?  ? ?Home Medications ?Prior to Admission medications   ?Medication Sig Start Date End Date Taking? Authorizing Provider  ?diclofenac (VOLTAREN) 50 MG EC tablet Take 1 tablet (50 mg total) by mouth 2 (two) times daily for 10 days. 02/23/22 03/05/22 Yes Jeannie Fend, PA-C  ?amLODipine (NORVASC) 5 MG tablet Take 1 tablet (5 mg total) by mouth daily. 05/25/19   Jene Every, MD  ?Susanne Borders 50-200-25 MG TABS tablet Take 1 tablet by mouth daily. 05/03/20   [provider]  ?DARAPRIM 25 MG tablet Take 50 mg by mouth daily. 05/03/20   [provider]  ?hydrochlorothiazide (HYDRODIURIL) 12.5 MG tablet Take 1 tablet (12.5 mg total) by mouth daily. 05/25/19   Jene Every, MD  ?leucovorin (WELLCOVORIN) 25 MG tablet Take 25 mg by mouth at bedtime. 05/03/20   [provider]  ?pantoprazole (PROTONIX) 40 MG tablet Take 1 tablet (40 mg total) by mouth daily. 04/20/20 05/20/20  Shaune Pollack, MD  ?sulfaDIAZINE 500 MG tablet Take 1,000 mg by mouth 3 (three) times daily. 05/03/20   [provider]  ?   ? ?Allergies    ?Aspirin   ? ?Review of Systems   ?Review of Systems ?Negative except as per HPI ?Physical Exam ?Updated Vital Signs ?BP (!) 107/54   Pulse 60   Temp 98.4 ?F (36.9 ?C)   Resp 18   SpO2 99%  ?Physical Exam ?Vitals and nursing note reviewed.   ?Constitutional:   ?   General: She is not in acute distress. ?   Appearance: She is well-developed. She is not diaphoretic.  ?HENT:  ?   Head: Normocephalic and atraumatic.  ?Cardiovascular:  ?   Rate and Rhythm: Normal rate and regular rhythm.  ?   Heart sounds: Normal heart sounds.  ?Pulmonary:  ?   Effort: Pulmonary effort is normal.  ?   Breath sounds: Normal breath sounds.  ?Abdominal:  ?   Palpations: Abdomen is soft.  ?   Tenderness: There is abdominal tenderness in the left lower quadrant. There is no right CVA tenderness or left CVA tenderness.  ?Skin: ?   General: Skin is warm and dry.  ?   Findings: No erythema or rash.  ?Neurological:  ?   Mental Status: She is alert and oriented to person, place, and time.  ?Psychiatric:     ?   Behavior: Behavior normal.  ? ? ?ED Results / Procedures / Treatments   ?Labs ?(all labs ordered are listed, but only abnormal results are displayed) ?Labs Reviewed  ?URINALYSIS, ROUTINE W REFLEX MICROSCOPIC - Abnormal; Notable for the following components:  ?    Result Value  ? APPearance HAZY (*)   ? Hgb urine dipstick LARGE (*)   ? Protein, ur 30 (*)   ? Bacteria, UA RARE (*)   ? All other components within  normal limits  ?CBC WITH DIFFERENTIAL/PLATELET  ?COMPREHENSIVE METABOLIC PANEL  ?HCG, QUANTITATIVE, PREGNANCY  ? ? ?EKG ?None ? ?Radiology ?US Transvaginal Non-OB ? ?Result Date: 02/23/2022 ?CLINICAL DATA:  Left pelvic pain EXAM: TRANSABDOMINAL AND TRANSVAGINAL ULTRASOUND OF PELVIS DOPPLER ULTRASOUND OF OVARIES TECHNIQUE: Both transabdominal and transvaginal ultrasound examinations of the pelvis were performed. Transabdominal technique was performed for global imaging of the pelvis including uterus, ovaries, adnexal regions, and pelvic cul-de-sac. It was necessary to proceed with endovaginal exam following the transabdominal exam to visualize the ovaries. Color and duplex Doppler ultrasound was utilized to evaluate blood flow to the ovaries. COMPARISON:  10/01/2009  FINDINGS: Uterus Measurements: 11.1 x 5.7 x 6.5 cm = volume: 215 mL. No fibroids or other mass visualized. Endometrium Thickness: 13 mm.  No focal abnormality visualized. Right ovary Measurements: 3.6 x 2.5 x 2.5 cm = volume: 12 mL. Normal appearance/no adnexal mass. Left ovary Measurements: 3.4 x 1.9 x 2.5 cm = volume: 9 mL. Normal appearance/no adnexal mass. Pulsed Doppler evaluation of both ovaries demonstrates normal low-resistance arterial and venous waveforms. Other findings No abnormal free fluid. IMPRESSION: Unremarkable pelvic ultrasound.  No evidence of adnexal torsion. Electronically Signed   By: Duanne Guess D.O.   On: 02/23/2022 15:53  ? ?US Pelvis Complete ? ?Result Date: 02/23/2022 ?CLINICAL DATA:  Left pelvic pain EXAM: TRANSABDOMINAL AND TRANSVAGINAL ULTRASOUND OF PELVIS DOPPLER ULTRASOUND OF OVARIES TECHNIQUE: Both transabdominal and transvaginal ultrasound examinations of the pelvis were performed. Transabdominal technique was performed for global imaging of the pelvis including uterus, ovaries, adnexal regions, and pelvic cul-de-sac. It was necessary to proceed with endovaginal exam following the transabdominal exam to visualize the ovaries. Color and duplex Doppler ultrasound was utilized to evaluate blood flow to the ovaries. COMPARISON:  10/01/2009 FINDINGS: Uterus Measurements: 11.1 x 5.7 x 6.5 cm = volume: 215 mL. No fibroids or other mass visualized. Endometrium Thickness: 13 mm.  No focal abnormality visualized. Right ovary Measurements: 3.6 x 2.5 x 2.5 cm = volume: 12 mL. Normal appearance/no adnexal mass. Left ovary Measurements: 3.4 x 1.9 x 2.5 cm = volume: 9 mL. Normal appearance/no adnexal mass. Pulsed Doppler evaluation of both ovaries demonstrates normal low-resistance arterial and venous waveforms. Other findings No abnormal free fluid. IMPRESSION: Unremarkable pelvic ultrasound.  No evidence of adnexal torsion. Electronically Signed   By: Duanne Guess D.O.   On: 02/23/2022  15:53  ? ?[place] Art/Ven Flow Abd Pelv Doppler ? ?Result Date: 02/23/2022 ?CLINICAL DATA:  Left pelvic pain EXAM: TRANSABDOMINAL AND TRANSVAGINAL ULTRASOUND OF PELVIS DOPPLER ULTRASOUND OF OVARIES TECHNIQUE: Both transabdominal and transvaginal ultrasound examinations of the pelvis were performed. Transabdominal technique was performed for global imaging of the pelvis including uterus, ovaries, adnexal regions, and pelvic cul-de-sac. It was necessary to proceed with endovaginal exam following the transabdominal exam to visualize the ovaries. Color and duplex Doppler ultrasound was utilized to evaluate blood flow to the ovaries. COMPARISON:  10/01/2009 FINDINGS: Uterus Measurements: 11.1 x 5.7 x 6.5 cm = volume: 215 mL. No fibroids or other mass visualized. Endometrium Thickness: 13 mm.  No focal abnormality visualized. Right ovary Measurements: 3.6 x 2.5 x 2.5 cm = volume: 12 mL. Normal appearance/no adnexal mass. Left ovary Measurements: 3.4 x 1.9 x 2.5 cm = volume: 9 mL. Normal appearance/no adnexal mass. Pulsed Doppler evaluation of both ovaries demonstrates normal low-resistance arterial and venous waveforms. Other findings No abnormal free fluid. IMPRESSION: Unremarkable pelvic ultrasound.  No evidence of adnexal torsion. Electronically Signed   By: Duanne Guess  D.O.   On: 02/23/2022 15:53   ? ?Procedures ?Procedures  ? ? ?Medications Ordered in ED ?Medications  ?HYDROmorphone (DILAUDID) injection 0.5 mg (0.5 mg Intravenous Given 02/23/22 1418)  ?ondansetron (ZOFRAN) injection 4 mg (4 mg Intravenous Given 02/23/22 1419)  ?sodium chloride 0.9 % bolus 1,000 mL (1,000 mLs Intravenous New Bag/Given 02/23/22 1418)  ?ketorolac (TORADOL) 15 MG/ML injection 7.5 mg (7.5 mg Intravenous Given 02/23/22 1618)  ? ? ?ED Course/ Medical Decision Making/ A&P ?  ?                        ?Medical Decision Making ?Amount and/or Complexity of Data Reviewed ?Labs: ordered. ?Radiology: ordered. ? ?Risk ?Prescription drug management. ? ? ?This  patient presents to the ED for concern of LLQ pelvic pain with nausea, this involves an extensive number of treatment options, and is a complaint that carries with it a high risk of complications and morbidity.  The differential

## 2022-02-23 NOTE — Discharge Instructions (Addendum)
Your work-up today has not identified a source for the pain you are experiencing.  Recommend recheck with your primary care provider.  Return to ER for worsening or concerning symptoms. ?Prescription for pain medication sent to your pharmacy. ?

## 2022-02-25 ENCOUNTER — Other Ambulatory Visit: Payer: Self-pay

## 2022-02-25 ENCOUNTER — Encounter (HOSPITAL_BASED_OUTPATIENT_CLINIC_OR_DEPARTMENT_OTHER): Payer: Self-pay | Admitting: Emergency Medicine

## 2022-02-25 ENCOUNTER — Emergency Department (HOSPITAL_BASED_OUTPATIENT_CLINIC_OR_DEPARTMENT_OTHER): Payer: Medicaid Other

## 2022-02-25 ENCOUNTER — Emergency Department (HOSPITAL_BASED_OUTPATIENT_CLINIC_OR_DEPARTMENT_OTHER)
Admission: EM | Admit: 2022-02-25 | Discharge: 2022-02-25 | Disposition: A | Discharge status: Home / Self Care | Payer: Medicaid Other | Attending: Emergency Medicine | Admitting: Emergency Medicine

## 2022-02-25 DIAGNOSIS — N939 Abnormal uterine and vaginal bleeding, unspecified: Secondary | ICD-10-CM | POA: Insufficient documentation

## 2022-02-25 DIAGNOSIS — R1031 Right lower quadrant pain: Secondary | ICD-10-CM | POA: Insufficient documentation

## 2022-02-25 DIAGNOSIS — J45909 Unspecified asthma, uncomplicated: Secondary | ICD-10-CM | POA: Diagnosis not present

## 2022-02-25 DIAGNOSIS — I10 Essential (primary) hypertension: Secondary | ICD-10-CM | POA: Insufficient documentation

## 2022-02-25 DIAGNOSIS — R110 Nausea: Secondary | ICD-10-CM | POA: Diagnosis not present

## 2022-02-25 DIAGNOSIS — Z21 Asymptomatic human immunodeficiency virus [HIV] infection status: Secondary | ICD-10-CM | POA: Insufficient documentation

## 2022-02-25 DIAGNOSIS — Z79899 Other long term (current) drug therapy: Secondary | ICD-10-CM | POA: Diagnosis not present

## 2022-02-25 DIAGNOSIS — R1030 Lower abdominal pain, unspecified: Secondary | ICD-10-CM

## 2022-02-25 LAB — COMPREHENSIVE METABOLIC PANEL
ALT: 11 U/L (ref 0–44)
AST: 15 U/L (ref 15–41)
Albumin: 3.8 g/dL (ref 3.5–5.0)
Alkaline Phosphatase: 78 U/L (ref 38–126)
Anion gap: 8 (ref 5–15)
BUN: 10 mg/dL (ref 6–20)
CO2: 25 mmol/L (ref 22–32)
Calcium: 9.2 mg/dL (ref 8.9–10.3)
Chloride: 105 mmol/L (ref 98–111)
Creatinine, Ser: 0.57 mg/dL (ref 0.44–1.00)
GFR, Estimated: >60 mL/min (ref >60)
Glucose, Bld: 79 mg/dL (ref 70–99)
Potassium: 3.7 mmol/L (ref 3.5–5.1)
Sodium: 138 mmol/L (ref 135–145)
Total Bilirubin: 0.4 mg/dL (ref 0.3–1.2)
Total Protein: 7.7 g/dL (ref 6.5–8.1)

## 2022-02-25 LAB — WET PREP, GENITAL
Clue Cells Wet Prep HPF POC: NONE SEEN
Sperm: NONE SEEN
Trich, Wet Prep: NONE SEEN
WBC, Wet Prep HPF POC: <10 (ref <10)
Yeast Wet Prep HPF POC: NONE SEEN

## 2022-02-25 LAB — CBC WITH DIFFERENTIAL/PLATELET
Abs Immature Granulocytes: 0.01 10*3/uL (ref 0.00–0.07)
Basophils Absolute: 0 10*3/uL (ref 0.0–0.1)
Basophils Relative: 1 %
Eosinophils Absolute: 0.1 10*3/uL (ref 0.0–0.5)
Eosinophils Relative: 2 %
HCT: 38.5 % (ref 36.0–46.0)
Hemoglobin: 12.4 g/dL (ref 12.0–15.0)
Immature Granulocytes: 0 %
Lymphocytes Relative: 57 %
Lymphs Abs: 2.8 10*3/uL (ref 0.7–4.0)
MCH: 26.9 pg (ref 26.0–34.0)
MCHC: 32.2 g/dL (ref 30.0–36.0)
MCV: 83.5 fL (ref 80.0–100.0)
Monocytes Absolute: 0.3 10*3/uL (ref 0.1–1.0)
Monocytes Relative: 6 %
Neutro Abs: 1.7 10*3/uL (ref 1.7–7.7)
Neutrophils Relative %: 34 %
Platelets: 337 10*3/uL (ref 150–400)
RBC: 4.61 MIL/uL (ref 3.87–5.11)
RDW: 15.1 % (ref 11.5–15.5)
WBC: 4.9 10*3/uL (ref 4.0–10.5)
nRBC: 0 % (ref 0.0–0.2)

## 2022-02-25 LAB — URINALYSIS, ROUTINE W REFLEX MICROSCOPIC
Bilirubin Urine: NEGATIVE
Glucose, UA: NEGATIVE mg/dL
Ketones, ur: NEGATIVE mg/dL
Leukocytes,Ua: NEGATIVE
Nitrite: NEGATIVE
Protein, ur: NEGATIVE mg/dL
Specific Gravity, Urine: 1.018 (ref 1.005–1.030)
pH: 7 (ref 5.0–8.0)

## 2022-02-25 LAB — LIPASE, BLOOD: Lipase: 39 U/L (ref 11–51)

## 2022-02-25 LAB — PREGNANCY, URINE: Preg Test, Ur: NEGATIVE

## 2022-02-25 MED ORDER — HYDROMORPHONE HCL 1 MG/ML IJ SOLN
1.0000 mg | Freq: Once | INTRAMUSCULAR | Status: DC
Start: 2022-02-25 — End: 2022-02-25

## 2022-02-25 MED ORDER — IOHEXOL 300 MG/ML  SOLN
100.0000 mL | Freq: Once | INTRAMUSCULAR | Status: AC | PRN
  Administered 2022-02-25: 100 mL via INTRAVENOUS

## 2022-02-25 MED ORDER — METRONIDAZOLE 500 MG PO TABS
500.0000 mg | ORAL_TABLET | Freq: Once | ORAL | Status: AC
  Administered 2022-02-25: 500 mg via ORAL
  Filled 2022-02-25: qty 1

## 2022-02-25 MED ORDER — SODIUM CHLORIDE 0.9 % IV BOLUS
1000.0000 mL | Freq: Once | INTRAVENOUS | Status: AC
  Administered 2022-02-25: 1000 mL via INTRAVENOUS

## 2022-02-25 MED ORDER — METRONIDAZOLE 500 MG PO TABS: 500.0000 mg | ORAL_TABLET | Freq: Two times a day (BID) | ORAL | 0 refills | Status: AC

## 2022-02-25 MED ORDER — ONDANSETRON HCL 4 MG/2ML IJ SOLN
4.0000 mg | Freq: Once | INTRAMUSCULAR | Status: AC
  Administered 2022-02-25: 4 mg via INTRAVENOUS
  Filled 2022-02-25: qty 2

## 2022-02-25 MED ORDER — DOXYCYCLINE HYCLATE 100 MG PO TABS
100.0000 mg | ORAL_TABLET | Freq: Once | ORAL | Status: AC
  Administered 2022-02-25: 100 mg via ORAL
  Filled 2022-02-25: qty 1

## 2022-02-25 MED ORDER — MORPHINE SULFATE (PF) 4 MG/ML IV SOLN
4.0000 mg | Freq: Once | INTRAVENOUS | Status: AC
  Administered 2022-02-25: 4 mg via INTRAVENOUS
  Filled 2022-02-25: qty 1

## 2022-02-25 MED ORDER — LIDOCAINE HCL (PF) 1 % IJ SOLN
INTRAMUSCULAR | Status: AC
  Administered 2022-02-25: 5 mL
  Filled 2022-02-25: qty 5

## 2022-02-25 MED ORDER — CEFTRIAXONE SODIUM 500 MG IJ SOLR
500.0000 mg | Freq: Once | INTRAMUSCULAR | Status: AC
Start: 2022-02-25 — End: 2022-02-25
  Administered 2022-02-25: 500 mg via INTRAMUSCULAR
  Filled 2022-02-25: qty 500

## 2022-02-25 MED ORDER — DOXYCYCLINE HYCLATE 100 MG PO CAPS: 100.0000 mg | ORAL_CAPSULE | Freq: Two times a day (BID) | ORAL | 0 refills | Status: AC

## 2022-02-25 NOTE — ED Triage Notes (Signed)
Pt to ED c/o LLQ pain that started last Friday, reports pain constant ever since ? Went to Ed On Friday for the same.  ?

## 2022-02-25 NOTE — ED Provider Notes (Signed)
?Penny [name] ?Provider Note ? ? ?Penny [name]: [id number] ?Penny [name]: 02/25/22  1120 ? ?  ? ?History ?PMH: HIV, HTN, Asthma, Bipolar 1 disorder ?Chief Complaint  ?Patient presents with  ? Abdominal Pain  ? ? ?Penny [name] is a 36 y.o. female.  Presents the ED with left lower quadrant abdominal pain.  She states that the pain started last Thursday night suddenly.  She says she went to the [facility] emergency department on Friday and obtain labs, urine, and transvaginal ultrasound.  Labs are overall unremarkable and her ultrasound did not show any evidence of ovarian torsion or any cyst.  I did she was apparently feeling better when she was discharged, and with normal labs, CT was not pursued at that [name].  He states that throughout the weekend her pain is gotten worse.  He has not moved at all and is still in the left lower quadrant.  She does states she started her period yesterday and has associated diarrhea, but this is not abnormal for her on her..  She denies any fevers or chills.  She says she has associated nausea but no vomiting.  Has never had this pain before.  She denies any history of kidney stones.  Denies dysuria or hematuria.  She does feel like she has pressure when she urinates due to the pain.  She denies any vaginal discharge.  She does state when they did the transvaginal ultrasound that she had pretty significant pain. ? ? ?Abdominal Pain ?Associated symptoms: nausea   ? ?  ? ?Home Medications ?Prior to Admission medications   ?Medication Sig Start Date End Date Taking? Authorizing Provider  ?doxycycline (VIBRAMYCIN) 100 MG capsule Take 1 capsule (100 mg total) by mouth 2 (two) times daily for 14 days. 02/25/22 03/11/22 Yes Penny [name], Penny Fridge, PA-C  ?metroNIDAZOLE (FLAGYL) 500 MG tablet Take 1 tablet (500 mg total) by mouth 2 (two) times daily for 14 days. 02/25/22 03/11/22 Yes Penny [name], Penny Fridge, PA-C  ?amLODipine (NORVASC) 5 MG tablet Take 1 tablet (5 mg total) by mouth  daily. 05/25/19   Penny Drafts, MD  ?Phillips Odor 50-200-25 MG TABS tablet Take 1 tablet by mouth daily. 05/03/20   [provider]  ?DARAPRIM 25 MG tablet Take 50 mg by mouth daily. 05/03/20   [provider]  ?diclofenac (VOLTAREN) 50 MG EC tablet Take 1 tablet (50 mg total) by mouth 2 (two) times daily for 10 days. 02/23/22 03/05/22  Penny Shanks, MD  ?hydrochlorothiazide (HYDRODIURIL) 12.5 MG tablet Take 1 tablet (12.5 mg total) by mouth daily. 05/25/19   Penny Drafts, MD  ?leucovorin (WELLCOVORIN) 25 MG tablet Take 25 mg by mouth at bedtime. 05/03/20   [provider]  ?pantoprazole (PROTONIX) 40 MG tablet Take 1 tablet (40 mg total) by mouth daily. 04/20/20 05/20/20  Penny Bruce, MD  ?sulfaDIAZINE 500 MG tablet Take 1,000 mg by mouth 3 (three) times daily. 05/03/20   [provider]  ?   ? ?Allergies    ?Aspirin   ? ?Review of Systems   ?Review of Systems  ?Gastrointestinal:  Positive for abdominal pain and nausea.  ?All other systems reviewed and are negative. ? ?Physical Exam ?Updated Vital Signs ?BP (!) 131/93 (BP Location: Left Arm)   Pulse 64   Temp 98.6 ?F (37 ?C) (Oral)   Resp 14   Ht 5\' 7"  (1.702 m)   Wt 90.7 kg   LMP 02/25/2022   SpO2 100%   BMI 31.32 kg/m?  ?  Physical Exam ?Vitals and nursing note reviewed. Exam conducted with a chaperone present.  ?Constitutional:   ?   General: She is not in acute distress. ?   Appearance: Normal appearance. She is not ill-appearing, toxic-appearing or diaphoretic.  ?HENT:  ?   Head: Normocephalic and atraumatic.  ?   Nose: No nasal deformity.  ?   Mouth/Throat:  ?   Lips: Pink. No lesions.  ?   Mouth: No injury, lacerations, oral lesions or angioedema.  ?   Pharynx: Uvula midline. No posterior oropharyngeal erythema or uvula swelling.  ?Eyes:  ?   General: Gaze aligned appropriately. No scleral icterus.    ?   Right eye: No discharge.     ?   Left eye: No discharge.  ?   Conjunctiva/sclera: Conjunctivae normal.  ?   Right eye:  Right conjunctiva is not injected. No exudate or hemorrhage. ?   Left eye: Left conjunctiva is not injected. No exudate or hemorrhage. ?Cardiovascular:  ?   Rate and Rhythm: Normal rate and regular rhythm.  ?   Pulses: Normal pulses.     ?     Radial pulses are 2+ on the right side and 2+ on the left side.  ?     Dorsalis pedis pulses are 2+ on the right side and 2+ on the left side.  ?   Heart sounds: Normal heart sounds, S1 normal and S2 normal. Heart sounds not distant. No murmur heard. ?  No friction rub. No gallop. No S3 or S4 sounds.  ?Pulmonary:  ?   Effort: Pulmonary effort is normal. No accessory muscle usage or respiratory distress.  ?   Breath sounds: Normal breath sounds. No stridor. No wheezing, rhonchi or rales.  ?Chest:  ?   Chest wall: No tenderness.  ?Abdominal:  ?   General: Abdomen is flat. Bowel sounds are normal. There is no distension.  ?   Palpations: Abdomen is soft. There is no mass or pulsatile mass.  ?   Tenderness: There is abdominal tenderness in the suprapubic area and left lower quadrant. There is guarding. There is no right CVA tenderness, left CVA tenderness or rebound. Negative signs include Murphy's sign, Rovsing's sign and McBurney's sign.  ?   Hernia: No hernia is present.  ?Genitourinary: ?   Vagina: Bleeding present. No tenderness.  ?   Cervix: Cervical motion tenderness present.  ?   Uterus: Enlarged and tender.   ?   Adnexa:     ?   Right: No mass or tenderness.      ?   Left: Tenderness present. No mass.    ?   Comments: Difficult to assess cervix due to bloody discharge. ?Musculoskeletal:  ?   Right lower leg: No edema.  ?   Left lower leg: No edema.  ?Skin: ?   General: Skin is warm and dry.  ?   Coloration: Skin is not jaundiced or pale.  ?   Findings: No bruising, erythema, lesion or rash.  ?Neurological:  ?   General: No focal deficit present.  ?   Mental Status: She is alert and oriented to person, place, and [name].  ?   GCS: GCS eye subscore is 4. GCS verbal subscore  is 5. GCS motor subscore is 6.  ?Psychiatric:     ?   Mood and Affect: Mood normal.     ?   Behavior: Behavior normal. Behavior is cooperative.  ? ? ?ED Results / Procedures /  Treatments   ?Labs ?(all labs ordered are listed, but only abnormal results are displayed) ?Labs Reviewed  ?URINALYSIS, ROUTINE W REFLEX MICROSCOPIC - Abnormal; Notable for the following components:  ?    Result Value  ? Hgb urine dipstick LARGE (*)   ? All other components within normal limits  ?WET PREP, GENITAL  ?CBC WITH DIFFERENTIAL/PLATELET  ?COMPREHENSIVE METABOLIC PANEL  ?LIPASE, BLOOD  ?PREGNANCY, URINE  ?GC/CHLAMYDIA PROBE AMP ([place]) NOT AT [facility]  ? ? ?EKG ?None ? ?Radiology ?CT Abdomen Pelvis W Contrast ? ?Result Date: 02/25/2022 ?CLINICAL DATA:  Left lower quadrant abdominal pain over the last 4 days. History of hernia repair. EXAM: CT ABDOMEN AND PELVIS WITH CONTRAST TECHNIQUE: Multidetector CT imaging of the abdomen and pelvis was performed using the standard protocol following bolus administration of intravenous contrast. RADIATION DOSE REDUCTION: This exam was performed according to the departmental dose-optimization program which includes automated exposure control, adjustment of the mA and/or kV according to patient size and/or use of iterative reconstruction technique. CONTRAST:  136mL OMNIPAQUE IOHEXOL 300 MG/ML  SOLN COMPARISON:  08/30/2019 FINDINGS: Lower chest: Normal Hepatobiliary: Liver parenchyma is normal.  No calcified gallstones. Pancreas: Normal Spleen: Normal Adrenals/Urinary Tract: Adrenal glands are normal. Kidneys are normal. No mass, stone or hydronephrosis. Bladder is normal. Stomach/Bowel: Stomach and small intestine are normal. Normal appendix. Normal colon. No diverticulosis or diverticulitis. Vascular/Lymphatic: Aorta and IVC are normal.  No adenopathy. Reproductive: Previous tubal ligation clips. Both ovaries appear unremarkable by CT. Other: No free fluid or air. There is periumbilical scarring,  presumably related to previous surgery. Similar appearance to the study of October 2020. Musculoskeletal: Negative IMPRESSION: No abnormality seen to explain left lower quadrant pain. No evidence of diverticulosis or

## 2022-02-25 NOTE — Discharge Instructions (Addendum)
Your Labs and CT scan today were unremarkable. We did a pelvic exam today, and findings were concerning for pelvic inflammatory disease. This is typically an inflammation of your uterus or fallopian tubes that is caused by an infective organism. I am going to place you on two antibiotics. Please take these as prescribed. You received your first dose here, so you do not need to take again until tonight.  ?Please return to the ED if your symptoms do not have any further improvement.  ?

## 2022-02-26 LAB — GC/CHLAMYDIA PROBE AMP (~~LOC~~) NOT AT ARMC
Chlamydia: NEGATIVE
Comment: NEGATIVE
Comment: NORMAL
Neisseria Gonorrhea: NEGATIVE

## 2022-04-30 ENCOUNTER — Ambulatory Visit (HOSPITAL_COMMUNITY)
Admission: EM | Admit: 2022-04-30 | Discharge: 2022-04-30 | Disposition: A | Discharge status: Home / Self Care | Payer: Medicaid Other | Attending: Family Medicine | Admitting: Family Medicine

## 2022-04-30 ENCOUNTER — Ambulatory Visit (INDEPENDENT_AMBULATORY_CARE_PROVIDER_SITE_OTHER): Payer: Medicaid Other

## 2022-04-30 ENCOUNTER — Encounter (HOSPITAL_COMMUNITY): Payer: Self-pay

## 2022-04-30 DIAGNOSIS — R0789 Other chest pain: Secondary | ICD-10-CM

## 2022-04-30 MED ORDER — KETOROLAC TROMETHAMINE 30 MG/ML IJ SOLN
30.0000 mg | Freq: Once | INTRAMUSCULAR | Status: AC
Start: 2022-04-30 — End: 2022-04-30
  Administered 2022-04-30: 30 mg via INTRAMUSCULAR

## 2022-04-30 MED ORDER — DICLOFENAC SODIUM 75 MG PO TBEC: 75.0000 mg | DELAYED_RELEASE_TABLET | Freq: Two times a day (BID) | ORAL | 0 refills | Status: AC

## 2022-04-30 MED ORDER — KETOROLAC TROMETHAMINE 30 MG/ML IJ SOLN
INTRAMUSCULAR | Status: AC
  Filled 2022-04-30: qty 1

## 2022-04-30 NOTE — Discharge Instructions (Addendum)
Your chest x-ray was clear.  You have been given a shot of Toradol 30 mg today.  Take diclofenac 75 mg--1 tablet 2 times daily as needed for pain  You could also try some Maalox or Mylanta over-the-counter--take it as needed for this discomfort in your chest, to see if coating your esophagus would help any

## 2022-04-30 NOTE — ED Triage Notes (Signed)
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 Chest pain ongoing for 2 days. Patient states feels like something is stuck and when moving a certain way feels a shooting pain in the left side of the body.   Chest pain with no relief, sometimes deep breaths will help.

## 2022-04-30 NOTE — ED Provider Notes (Signed)
[facility]    CSN: [id number] Arrival date & time: 04/30/22  1146      History   Chief Complaint Chief Complaint  Patient presents with   Chest Pain    HPI Penny Young is a 36 y.o. female.    Chest Pain Here for anterior central chest pain.  It began yesterday after she had eaten some ox tail.  She felt like something stuck there.  It is tender, and can worsen with deep breath.  No fever or cough.  Eating does not worsen it.  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 Authorizing Provider  amLODipine (NORVASC) 5 MG tablet Take 1 tablet (5 mg total) by mouth daily. 05/25/19  Yes Jene Every, MD  BIKTARVY 50-200-25 MG TABS tablet Take 1 tablet by mouth daily. 05/03/20  Yes [provider]  diclofenac (VOLTAREN) 75 MG EC tablet Take 1 tablet (75 mg total) by mouth 2 (two) times daily. 04/30/22  Yes Zenia Resides, MD  potassium chloride (KLOR-CON) 10 MEQ tablet TAKE 1 TABLET(10 MEQ) BY MOUTH TWICE DAILY 01/15/22  Yes  [provider]    Family History Family History  Problem Relation Age of Onset   Hypertension Other     Social History Social History   Tobacco Use   Smoking status: Every Day    Packs/day: 0.10    Types: Cigarettes    Last attempt to quit: 11/26/2007    Years since quitting: 14.4   Smokeless tobacco: Never  Vaping Use   Vaping Use: Never used  Substance Use Topics   Alcohol use: Not Currently    Comment: occ   Drug use: Yes    Types: Marijuana    Comment: past abuse     Allergies   Aspirin   Review of Systems Review of Systems  Cardiovascular:  Positive for chest pain.    Physical Exam Triage Vital Signs ED Triage Vitals  Enc Vitals Group     BP 04/30/22 1152 122/84     Pulse Rate 04/30/22 1152 84  Resp 04/30/22 1152 16     Temp 04/30/22 1152 97.9 F (36.6 C)     Temp Source 04/30/22 1152 Oral     SpO2 04/30/22 1152 100 %     Weight 04/30/22 1155 200 lb (90.7 kg)     Height 04/30/22 1155 5\' 7"  (1.702 m)     Head Circumference --      Peak Flow --      Pain Score 04/30/22 1154 8     Pain Loc --      Pain Edu? --      Excl. in GC? --    No data found.  Updated Vital Signs BP 122/84 (BP Location: Right Arm)   Pulse 84   Temp 97.9 F (36.6 C) (Oral)   Resp 16   Ht 5\' 7"  (1.702 m)   Wt 90.7 kg   LMP 04/20/2022 (Approximate)   SpO2 100%   BMI 31.32 kg/m   Visual Acuity Right Eye Distance:   Left Eye Distance:   Bilateral Distance:    Right Eye Near:   Left Eye Near:    Bilateral Near:     Physical Exam Vitals reviewed.  Constitutional:      General: She is not in acute distress.    Appearance: She is not ill-appearing, toxic-appearing or diaphoretic.  HENT:     Nose: Nose normal.     Mouth/Throat:     Mouth: Mucous membranes are moist.     Pharynx: No oropharyngeal exudate or posterior oropharyngeal erythema.  Eyes:     Extraocular Movements: Extraocular movements intact.     Conjunctiva/sclera: Conjunctivae normal.      Pupils: Pupils are equal, round, and reactive to light.  Cardiovascular:     Rate and Rhythm: Normal rate and regular rhythm.     Heart sounds: No murmur heard. Pulmonary:     Effort: Pulmonary effort is normal. No respiratory distress.     Breath sounds: No wheezing, rhonchi or rales.  Chest:     Chest wall: Tenderness (left anterior chest is tender) present.  Musculoskeletal:     Cervical back: Neck supple.  Lymphadenopathy:     Cervical: No cervical adenopathy.  Skin:    Capillary Refill: Capillary refill takes less than 2 seconds.     Coloration: Skin is not jaundiced or pale.  Neurological:     General: No focal deficit present.     Mental Status: She is alert and oriented to person, place, and time.  Psychiatric:        Behavior: Behavior normal.     UC Treatments / Results  Labs (all labs ordered are listed, but only abnormal results are displayed) Labs Reviewed - No data to display  EKG   Radiology DG Chest 2 View  Result Date: 04/30/2022 CLINICAL DATA:  Precordial pain. EXAM: CHEST - 2 VIEW COMPARISON:  Chest radiograph 11/29/2021 FINDINGS: The cardiomediastinal silhouette is unchanged with normal heart size. The lungs are well inflated and clear. No pleural effusion or pneumothorax is identified. No acute osseous abnormality is seen. IMPRESSION: No active cardiopulmonary disease. Electronically Signed   By: Sebastian Ache M.D.   On: 04/30/2022 14:13    Procedures Procedures (including critical care time)  Medications Ordered in UC Medications  ketorolac (TORADOL) 30 MG/ML injection 30 mg (has no administration in time range)    Initial Impression / Assessment and Plan / UC Course  I have reviewed the triage vital signs and the nursing notes.  Pertinent  labs & imaging results that were available during my care of the patient were reviewed by me and considered in my medical decision making (see chart for details).     Her chest x-ray is clear and does not  show any abnormality.  We will treat for possible chest wall pain and she will follow-up with her regular doctors if not improving  I did verify that she tolerated the diclofenac given in the last few months.  She has an allergy to aspirin; it causes itching Final Clinical Impressions(s) / UC Diagnoses   Final diagnoses:  Atypical chest pain     Discharge Instructions      Your chest x-ray was clear.  You have been given a shot of Toradol 30 mg today.  Take diclofenac 75 mg--1 tablet 2 times daily as needed for pain  You could also try some Maalox or Mylanta over-the-counter--take it as needed for this discomfort in your chest, to see if coating your esophagus would help any     ED Prescriptions     Medication Sig Dispense Auth. Provider   diclofenac (VOLTAREN) 75 MG EC tablet Take 1 tablet (75 mg total) by mouth 2 (two) times daily. 60 tablet Jaionna Weisse, Janace Aris, MD      I have reviewed the PDMP during this encounter.   Zenia Resides, MD 04/30/22 [phone]

## 2022-05-22 ENCOUNTER — Ambulatory Visit (HOSPITAL_COMMUNITY)
Admission: EM | Admit: 2022-05-22 | Discharge: 2022-05-22 | Disposition: A | Discharge status: Home / Self Care | Payer: Medicaid Other | Attending: Family Medicine | Admitting: Family Medicine

## 2022-05-22 ENCOUNTER — Telehealth (HOSPITAL_COMMUNITY): Payer: Self-pay | Admitting: Family Medicine

## 2022-05-22 DIAGNOSIS — M545 Low back pain, unspecified: Secondary | ICD-10-CM

## 2022-05-22 MED ORDER — HYDROCODONE-ACETAMINOPHEN 5-325 MG PO TABS: 1.0000 | ORAL_TABLET | Freq: Four times a day (QID) | ORAL | 0 refills | Status: DC | PRN

## 2022-05-22 MED ORDER — DICLOFENAC SODIUM 75 MG PO TBEC: 75.0000 mg | DELAYED_RELEASE_TABLET | Freq: Two times a day (BID) | ORAL | 0 refills | Status: DC

## 2022-05-22 MED ORDER — METHOCARBAMOL 500 MG PO TABS: 500.0000 mg | ORAL_TABLET | Freq: Four times a day (QID) | ORAL | 0 refills | Status: DC

## 2022-05-22 MED ORDER — HYDROCODONE-ACETAMINOPHEN 5-325 MG PO TABS: 1.0000 | ORAL_TABLET | ORAL | 0 refills | Status: DC | PRN

## 2022-05-22 NOTE — ED Provider Notes (Signed)
[facility]    CSN: [id number] Arrival date & time: 05/22/22  1227      History   Chief Complaint Chief Complaint  Patient presents with   Back Pain    HPI Penny Young is a 36 y.o. female.   Pt complains of low back pain after moving furniture for a friend.  No relief with otc medications   The history is provided by the patient. No language interpreter was used.  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   Drug abuse (HCC)    Hepatitis B    HIV (human immunodeficiency virus infection) (HCC)    Hypertension    Obese    Restless leg syndrome    Schizophrenia [facility])     Patient Active Problem List   Diagnosis Date Noted   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 Authorizing Provider  diclofenac (VOLTAREN) 75 MG EC tablet Take 1 tablet (75 mg total) by mouth 2 (two) times daily. 05/22/22  Yes Elson Areas, PA-C  HYDROcodone-acetaminophen (NORCO/VICODIN) 5-325 MG tablet Take 1 tablet by mouth every 4 (four) hours as needed for up to 3 days for moderate pain. 05/22/22 05/25/22 Yes Elson Areas, PA-C  methocarbamol (ROBAXIN) 500 MG tablet Take 1 tablet  (500 mg total) by mouth 4 (four) times daily. 05/22/22  Yes Cheron Schaumann K, PA-C  amLODipine (NORVASC) 5 MG tablet Take 1 tablet (5 mg total) by mouth daily. 05/25/19   Jene Every, MD  BIKTARVY 50-200-25 MG TABS tablet Take 1 tablet by mouth daily. 05/03/20   [provider]  diclofenac (VOLTAREN) 75 MG EC tablet Take 1 tablet (75 mg total) by mouth 2 (two) times daily. 04/30/22   Zenia Resides, MD  potassium chloride (KLOR-CON) 10 MEQ tablet TAKE 1 TABLET(10 MEQ) BY MOUTH TWICE DAILY 01/15/22   [provider]    Family History Family History  Problem Relation Age of Onset   Hypertension Other     Social History Social History   Tobacco Use   Smoking status: Every Day    Packs/day: 0.10    Types: Cigarettes    Last attempt to quit: 11/26/2007    Years since quitting: 14.4   Smokeless tobacco: Never  Vaping Use   Vaping Use: Never used  Substance Use Topics   Alcohol use: Not Currently    Comment: occ   Drug use: Yes    Types: Marijuana    Comment: past abuse     Allergies   Aspirin   Review of Systems Review of Systems  Constitutional:  Negative for fever.  Musculoskeletal:  Positive for back pain.  All other systems reviewed and are negative.    Physical Exam Triage Vital Signs ED Triage Vitals  Enc Vitals Group     BP 05/22/22 1304 (!) 143/87     Pulse Rate 05/22/22 1304 80     Resp --      Temp 05/22/22 1304 99 F (37.2 C)     Temp Source 05/22/22 1304 Oral     SpO2 05/22/22 1304 98 %     Weight --      Height --      Head Circumference --      Peak Flow --      Pain Score 05/22/22 1410 5     Pain Loc --      Pain Edu? --      Excl. in GC? --    No data found.  Updated Vital Signs BP (!) 143/87 (BP Location: Left Arm)   Pulse 80   Temp 99 F (37.2 C) (Oral)   LMP 04/20/2022 (Approximate)   SpO2 98%   Visual Acuity Right Eye Distance:   Left Eye Distance:   Bilateral Distance:    Right Eye Near:   Left Eye Near:     Bilateral Near:     Physical Exam Vitals and nursing note reviewed.  Constitutional:      General: She is not in acute distress.    Appearance: She is well-developed.  HENT:     Head: Normocephalic and atraumatic.  Cardiovascular:     Rate and Rhythm: Normal rate and regular rhythm.     Heart sounds: No murmur heard. Pulmonary:     Effort: Pulmonary effort is normal. No respiratory distress.     Breath sounds: Normal breath sounds.  Abdominal:     Palpations: Abdomen is soft.     Tenderness: There is no abdominal tenderness.  Musculoskeletal:        General: No swelling.  Skin:    General: Skin is warm and dry.     Capillary Refill: Capillary refill takes less than 2 seconds.  Neurological:     Mental Status: She is alert.  Psychiatric:        Mood and Affect: Mood normal.      UC Treatments / Results  Labs (all labs ordered are listed, but only abnormal results are displayed) Labs Reviewed - No data to display  EKG   Radiology No results found.  Procedures Procedures (including critical care time)  Medications Ordered in UC Medications - No data to display  Initial Impression / Assessment and Plan / UC Course  I have reviewed the triage vital signs and the nursing notes.  Pertinent labs & imaging results that were available during my care of the patient were reviewed by me and considered in my medical decision making (see chart for details).      Final Clinical Impressions(s) / UC Diagnoses   Final diagnoses:  Acute low back pain without sciatica, unspecified back pain laterality   Discharge Instructions   None    ED Prescriptions     Medication Sig Dispense Auth. Provider   diclofenac (VOLTAREN) 75 MG EC tablet  Take 1 tablet (75 mg total) by mouth 2 (two) times daily. 20 tablet Radiah Lubinski K, [place]   methocarbamol (ROBAXIN) 500 MG tablet Take 1 tablet (500 mg total) by mouth 4 (four) times daily. 20 tablet Clifton Kovacic K, [place]    HYDROcodone-acetaminophen (NORCO/VICODIN) 5-325 MG tablet Take 1 tablet by mouth every 4 (four) hours as needed for up to 3 days for moderate pain. 10 tablet Elson Areas, [place]      PDMP not reviewed this encounter. An After Visit Summary was printed and given to the patient.    Elson Areas, [place] 05/22/22 1557

## 2022-05-22 NOTE — Telephone Encounter (Signed)
Rx Norco failed to sent to pharmacy. Resent.

## 2022-05-22 NOTE — ED Triage Notes (Signed)
Pt presents with lower back pain. She reports moving heavy boxes at work and thinks she may have pull a muscle.

## 2022-05-23 ENCOUNTER — Telehealth (HOSPITAL_COMMUNITY): Payer: Self-pay | Admitting: Physician Assistant

## 2022-05-23 MED ORDER — HYDROCODONE-ACETAMINOPHEN 5-325 MG PO TABS: 1.0000 | ORAL_TABLET | ORAL | 0 refills | Status: DC | PRN

## 2022-05-23 NOTE — Telephone Encounter (Signed)
Pharmacy change

## 2022-05-27 ENCOUNTER — Emergency Department (HOSPITAL_COMMUNITY)
Admission: EM | Admit: 2022-05-27 | Discharge: 2022-05-27 | Disposition: A | Discharge status: Home / Self Care | Payer: Medicaid Other | Attending: Emergency Medicine | Admitting: Emergency Medicine

## 2022-05-27 ENCOUNTER — Encounter (HOSPITAL_COMMUNITY): Payer: Self-pay | Admitting: Pharmacy Technician

## 2022-05-27 ENCOUNTER — Emergency Department (HOSPITAL_COMMUNITY): Payer: Medicaid Other

## 2022-05-27 DIAGNOSIS — M545 Low back pain, unspecified: Secondary | ICD-10-CM

## 2022-05-27 DIAGNOSIS — Y990 Civilian activity done for income or pay: Secondary | ICD-10-CM | POA: Diagnosis not present

## 2022-05-27 DIAGNOSIS — G8911 Acute pain due to trauma: Secondary | ICD-10-CM | POA: Diagnosis not present

## 2022-05-27 DIAGNOSIS — X500XXA Overexertion from strenuous movement or load, initial encounter: Secondary | ICD-10-CM | POA: Insufficient documentation

## 2022-05-27 LAB — RAPID URINE DRUG SCREEN, HOSP PERFORMED
Amphetamines: NOT DETECTED
Barbiturates: NOT DETECTED
Benzodiazepines: NOT DETECTED
Cocaine: POSITIVE — AB
Opiates: NOT DETECTED
Tetrahydrocannabinol: POSITIVE — AB

## 2022-05-27 LAB — COMPREHENSIVE METABOLIC PANEL
ALT: 15 U/L (ref 0–44)
AST: 22 U/L (ref 15–41)
Albumin: 3.7 g/dL (ref 3.5–5.0)
Alkaline Phosphatase: 76 U/L (ref 38–126)
Anion gap: 8 (ref 5–15)
BUN: 13 mg/dL (ref 6–20)
CO2: 25 mmol/L (ref 22–32)
Calcium: 9.2 mg/dL (ref 8.9–10.3)
Chloride: 105 mmol/L (ref 98–111)
Creatinine, Ser: 0.67 mg/dL (ref 0.44–1.00)
GFR, Estimated: >60 mL/min (ref >60)
Glucose, Bld: 93 mg/dL (ref 70–99)
Potassium: 4 mmol/L (ref 3.5–5.1)
Sodium: 138 mmol/L (ref 135–145)
Total Bilirubin: 0.9 mg/dL (ref 0.3–1.2)
Total Protein: 7.5 g/dL (ref 6.5–8.1)

## 2022-05-27 LAB — CBC WITH DIFFERENTIAL/PLATELET
Abs Immature Granulocytes: 0.01 10*3/uL (ref 0.00–0.07)
Basophils Absolute: 0 10*3/uL (ref 0.0–0.1)
Basophils Relative: 0 %
Eosinophils Absolute: 0.1 10*3/uL (ref 0.0–0.5)
Eosinophils Relative: 2 %
HCT: 39.4 % (ref 36.0–46.0)
Hemoglobin: 13.4 g/dL (ref 12.0–15.0)
Immature Granulocytes: 0 %
Lymphocytes Relative: 51 %
Lymphs Abs: 2.5 10*3/uL (ref 0.7–4.0)
MCH: 28.8 pg (ref 26.0–34.0)
MCHC: 34 g/dL (ref 30.0–36.0)
MCV: 84.7 fL (ref 80.0–100.0)
Monocytes Absolute: 0.4 10*3/uL (ref 0.1–1.0)
Monocytes Relative: 7 %
Neutro Abs: 2 10*3/uL (ref 1.7–7.7)
Neutrophils Relative %: 40 %
Platelets: 324 10*3/uL (ref 150–400)
RBC: 4.65 MIL/uL (ref 3.87–5.11)
RDW: 14.7 % (ref 11.5–15.5)
WBC: 5 10*3/uL (ref 4.0–10.5)
nRBC: 0 % (ref 0.0–0.2)

## 2022-05-27 LAB — URINALYSIS, ROUTINE W REFLEX MICROSCOPIC
Bilirubin Urine: NEGATIVE
Glucose, UA: NEGATIVE mg/dL
Hgb urine dipstick: NEGATIVE
Ketones, ur: NEGATIVE mg/dL
Leukocytes,Ua: NEGATIVE
Nitrite: NEGATIVE
Protein, ur: NEGATIVE mg/dL
Specific Gravity, Urine: 1.024 (ref 1.005–1.030)
pH: 6 (ref 5.0–8.0)

## 2022-05-27 LAB — LIPASE, BLOOD: Lipase: 37 U/L (ref 11–51)

## 2022-05-27 LAB — I-STAT BETA HCG BLOOD, ED (MC, WL, AP ONLY): I-stat hCG, quantitative: <5 m[IU]/mL (ref <5)

## 2022-05-27 MED ORDER — PREDNISONE 10 MG PO TABS: 20.0000 mg | ORAL_TABLET | Freq: Every day | ORAL | 0 refills | Status: DC

## 2022-05-27 NOTE — ED Provider Notes (Signed)
[facility] EM[facility] DEPARTMENT Provider Note   CSN: [id number] Arrival date & time: 05/27/22  1008     History  Chief Complaint  Patient presents with   Back Pain    Penny Young is a 36 y.o. female.  36 year old female with prior medical history as detailed below presents for evaluation.  Patient reports approximately 2 weeks of left lower back pain.  Patient's pain is worse with heavy lifting.  She reports that she helped a friend move approximately 2 weeks ago and then developed pain.  She lives every day at work.  She was seen at urgent care recently without improvement of her pain after being administered Norco and muscle relaxant Flexeril.  Patient denies associated weakness.  She denies fever.  She denies nausea or vomiting.  She denies other complaint.  The history is provided by the patient and medical records.  Back Pain Pain location: Left lower lateral back. Quality:  Aching Radiates to:  Does not radiate Pain severity:  Mild Pain is:  Same all the time Onset quality:  Gradual Duration:  2 weeks Timing:  Intermittent Progression:  Waxing and waning      Home Medications Prior to Admission medications   Medication Sig Start Date End Date Taking? Authorizing Provider  predniSONE (DELTASONE) 10 MG tablet Take 2 tablets (20 mg total) by mouth daily. 05/27/22  Yes Wynetta Fines, MD  amLODipine (NORVASC) 5 MG tablet Take 1 tablet (5 mg total) by mouth daily. 05/25/19   Jene Every, MD  BIKTARVY 50-200-25 MG TABS tablet Take 1 tablet by mouth daily. 05/03/20   [provider]  diclofenac (VOLTAREN) 75 MG EC tablet Take 1 tablet (75 mg total) by mouth 2 (two) times daily. 04/30/22   Zenia Resides, MD  diclofenac (VOLTAREN) 75 MG EC tablet Take 1 tablet (75 mg total) by mouth 2 (two) times daily. 05/22/22   Elson Areas, PA-C  HYDROcodone-acetaminophen (NORCO/VICODIN) 5-325 MG tablet Take 1 tablet by mouth every 6 (six) hours as  needed for moderate pain or severe pain. 05/22/22   Mardella Layman, MD  HYDROcodone-acetaminophen (NORCO/VICODIN) 5-325 MG tablet Take 1 tablet by mouth every 4 (four) hours as needed for moderate pain. 05/23/22 05/23/23  Elson Areas, PA-C  methocarbamol (ROBAXIN) 500 MG tablet Take 1 tablet (500 mg total) by mouth 4 (four) times daily. 05/22/22   Elson Areas, PA-C  potassium chloride (KLOR-CON) 10 MEQ tablet TAKE 1 TABLET(10 MEQ) BY MOUTH TWICE DAILY 01/15/22   [provider]      Allergies    Aspirin    Review of Systems   Review of Systems  Musculoskeletal:  Positive for back pain.  All other systems reviewed and are negative.   Physical Exam Updated Vital Signs BP (!) 151/106 (BP Location: Left Arm)   Pulse 88   Temp 98 F (36.7 C)   Resp 17   LMP 04/20/2022 (Approximate)   SpO2 100%  Physical Exam Vitals and nursing note reviewed.  Constitutional:      General: She is not in acute distress.    Appearance: Normal appearance. She is well-developed.  HENT:     Head: Normocephalic and atraumatic.  Eyes:     Conjunctiva/sclera: Conjunctivae normal.     Pupils: Pupils are equal, round, and reactive to light.  Cardiovascular:     Rate and Rhythm: Normal rate and regular rhythm.     Heart sounds: Normal heart sounds.  Pulmonary:  Effort: Pulmonary effort is normal. No respiratory distress.     Breath sounds: Normal breath sounds.  Abdominal:     General: There is no distension.     Palpations: Abdomen is soft.     Tenderness: There is no abdominal tenderness.  Musculoskeletal:        General: Tenderness present. No deformity. Normal range of motion.     Cervical back: Normal range of motion and neck supple.     Comments: Mild tenderness to left lateral lumbar back.  No midline tenderness.  No overlying erythema or edema.  Straight leg raise is negative bilaterally.  No observed weakness in both lower extremities.  Normal gait.  Skin:    General: Skin is  warm and dry.  Neurological:     General: No focal deficit present.     Mental Status: She is alert and oriented to person, place, and time.     ED Results / Procedures / Treatments   Labs (all labs ordered are listed, but only abnormal results are displayed) Labs Reviewed  URINALYSIS, ROUTINE W REFLEX MICROSCOPIC - Abnormal; Notable for the following components:      Result Value   APPearance HAZY (*)    All other components within normal limits  RAPID URINE DRUG SCREEN, HOSP PERFORMED - Abnormal; Notable for the following components:   Cocaine POSITIVE (*)    Tetrahydrocannabinol POSITIVE (*)    All other components within normal limits  COMPREHENSIVE METABOLIC PANEL  LIPASE, BLOOD  CBC WITH DIFFERENTIAL/PLATELET  I-STAT BETA HCG BLOOD, ED ([facility], [facility], [facility] ONLY)    EKG None  Radiology CT ABDOMEN PELVIS WO CONTRAST  Result Date: 05/27/2022 CLINICAL DATA:  Left flank pain EXAM: CT ABDOMEN AND PELVIS WITHOUT CONTRAST TECHNIQUE: Multidetector CT imaging of the abdomen and pelvis was performed following the standard protocol without IV contrast. RADIATION DOSE REDUCTION: This exam was performed according to the departmental dose-optimization program which includes automated exposure control, adjustment of the mA and/or kV according to patient size and/or use of iterative reconstruction technique. COMPARISON:  CT abdomen and pelvis dated February 25, 2022 FINDINGS: Lower chest: No acute abnormality. Hepatobiliary: No focal liver abnormality is seen. No gallstones, gallbladder wall thickening, or biliary dilatation. Pancreas: Unremarkable. No pancreatic ductal dilatation or surrounding inflammatory changes. Spleen: Normal in size without focal abnormality. Adrenals/Urinary Tract: Adrenal glands are unremarkable. Kidneys are normal, without renal calculi, focal lesion, or hydronephrosis. Bladder is unremarkable. Stomach/Bowel: Stomach is within normal limits. Appendix appears normal. No evidence of  bowel wall thickening, distention, or inflammatory changes. Vascular/Lymphatic: No significant vascular findings are present. No enlarged abdominal or pelvic lymph nodes. Reproductive: Uterus and bilateral adnexa are unremarkable. Other: Periumbilical scarring, unchanged when compared with prior exam. No abdominopelvic ascites. Musculoskeletal: No acute or significant osseous findings. IMPRESSION: No acute findings in the abdomen or pelvis, including no evidence of obstructive uropathy. Electronically Signed   By: Yetta Glassman M.D.   On: 05/27/2022 12:59    Procedures Procedures    Medications Ordered in ED Medications - No data to display  ED Course/ Medical Decision Making/ A&P                           Medical Decision Making Amount and/or Complexity of Data Reviewed Labs: ordered. Radiology: ordered.  Risk Prescription drug management.    Medical Screen Complete  This patient presented to the ED with complaint of low back pain.  This complaint involves  an extensive number of treatment options. The initial differential diagnosis includes, but is not limited to, musculoskeletal pain, metabolic abnormality, renal colic, etc.  This presentation is: Acute, Self-Limited, Previously Undiagnosed, Uncertain Prognosis, Complicated, Systemic Symptoms, and Threat to Life/Bodily Function  Patient is presenting with reported left low back pain.  Patient's pain has been ongoing for the last 2 weeks.  Patient's pain began after lifting.  She continues to lift every day at work.  Work-up today is without evidence of significant abnormality.  Patient reassured by work-up.  She understands need for close outpatient follow-up.  Importance of close follow-up was stressed.  Strict return precautions given understood.    Additional history obtained:  External records from outside sources obtained and reviewed including prior ED visits and prior Inpatient records.    Lab Tests:  I  ordered and personally interpreted labs.  The pertinent results include: CBC, CMP, lipase, hCG, urine tox, UA   Imaging Studies ordered:  I ordered imaging studies including CT abdomen pelvis I independently visualized and interpreted obtained imaging which showed NAD I agree with the radiologist interpretation.  Problem List / ED Course:  Left low back pain   Reevaluation:  After the interventions noted above, I reevaluated the patient and found that they have: improved Disposition:  After consideration of the diagnostic results and the patients response to treatment, I feel that the patent would benefit from close outpatient follow-up.          Final Clinical Impression(s) / ED Diagnoses Final diagnoses:  Acute left-sided low back pain without sciatica    Rx / DC Orders ED Discharge Orders          Ordered    predniSONE (DELTASONE) 10 MG tablet  Daily        05/27/22 1314              Wynetta Fines, MD 05/27/22 1319

## 2022-05-27 NOTE — ED Triage Notes (Signed)
Pt here with ongoing L lower back pain after helping a friend move approx 2 weeks ago. Pt states she went back to work yesterday and now pain is excruciating.

## 2022-05-27 NOTE — Discharge Instructions (Signed)
Return for any problem.  ?

## 2022-06-16 ENCOUNTER — Other Ambulatory Visit: Payer: Self-pay

## 2022-06-16 ENCOUNTER — Emergency Department (HOSPITAL_COMMUNITY)
Admission: EM | Admit: 2022-06-16 | Discharge: 2022-06-16 | Disposition: A | Discharge status: Home / Self Care | Payer: Medicaid Other | Attending: Emergency Medicine | Admitting: Emergency Medicine

## 2022-06-16 ENCOUNTER — Encounter (HOSPITAL_COMMUNITY): Payer: Self-pay

## 2022-06-16 DIAGNOSIS — N939 Abnormal uterine and vaginal bleeding, unspecified: Secondary | ICD-10-CM | POA: Diagnosis present

## 2022-06-16 DIAGNOSIS — N921 Excessive and frequent menstruation with irregular cycle: Secondary | ICD-10-CM

## 2022-06-16 LAB — CBC WITH DIFFERENTIAL/PLATELET
Abs Immature Granulocytes: 0.01 10*3/uL (ref 0.00–0.07)
Basophils Absolute: 0 10*3/uL (ref 0.0–0.1)
Basophils Relative: 0 %
Eosinophils Absolute: 0.1 10*3/uL (ref 0.0–0.5)
Eosinophils Relative: 1 %
HCT: 40 % (ref 36.0–46.0)
Hemoglobin: 13.1 g/dL (ref 12.0–15.0)
Immature Granulocytes: 0 %
Lymphocytes Relative: 30 %
Lymphs Abs: 2.3 10*3/uL (ref 0.7–4.0)
MCH: 27.8 pg (ref 26.0–34.0)
MCHC: 32.8 g/dL (ref 30.0–36.0)
MCV: 84.9 fL (ref 80.0–100.0)
Monocytes Absolute: 0.5 10*3/uL (ref 0.1–1.0)
Monocytes Relative: 6 %
Neutro Abs: 4.7 10*3/uL (ref 1.7–7.7)
Neutrophils Relative %: 63 %
Platelets: 352 10*3/uL (ref 150–400)
RBC: 4.71 MIL/uL (ref 3.87–5.11)
RDW: 14.9 % (ref 11.5–15.5)
WBC: 7.5 10*3/uL (ref 4.0–10.5)
nRBC: 0 % (ref 0.0–0.2)

## 2022-06-16 LAB — URINALYSIS, ROUTINE W REFLEX MICROSCOPIC
Bilirubin Urine: NEGATIVE
Glucose, UA: NEGATIVE mg/dL
Ketones, ur: NEGATIVE mg/dL
Nitrite: NEGATIVE
Protein, ur: 30 mg/dL — AB
Specific Gravity, Urine: 1.026 (ref 1.005–1.030)
pH: 5 (ref 5.0–8.0)

## 2022-06-16 LAB — I-STAT BETA HCG BLOOD, ED (MC, WL, AP ONLY): I-stat hCG, quantitative: <5 m[IU]/mL (ref <5)

## 2022-06-16 LAB — COMPREHENSIVE METABOLIC PANEL
ALT: 16 U/L (ref 0–44)
AST: 18 U/L (ref 15–41)
Albumin: 3.4 g/dL — ABNORMAL LOW (ref 3.5–5.0)
Alkaline Phosphatase: 75 U/L (ref 38–126)
Anion gap: 7 (ref 5–15)
BUN: 11 mg/dL (ref 6–20)
CO2: 24 mmol/L (ref 22–32)
Calcium: 8.9 mg/dL (ref 8.9–10.3)
Chloride: 107 mmol/L (ref 98–111)
Creatinine, Ser: 0.77 mg/dL (ref 0.44–1.00)
GFR, Estimated: >60 mL/min (ref >60)
Glucose, Bld: 124 mg/dL — ABNORMAL HIGH (ref 70–99)
Potassium: 3.7 mmol/L (ref 3.5–5.1)
Sodium: 138 mmol/L (ref 135–145)
Total Bilirubin: 0.5 mg/dL (ref 0.3–1.2)
Total Protein: 7.1 g/dL (ref 6.5–8.1)

## 2022-06-16 LAB — TYPE AND SCREEN
ABO/RH(D): B POS
Antibody Screen: NEGATIVE

## 2022-06-16 LAB — LIPASE, BLOOD: Lipase: 34 U/L (ref 11–51)

## 2022-06-16 MED ORDER — ONDANSETRON 4 MG PO TBDP
4.0000 mg | ORAL_TABLET | Freq: Once | ORAL | Status: AC
Start: 2022-06-16 — End: 2022-06-16
  Administered 2022-06-16: 4 mg via ORAL
  Filled 2022-06-16: qty 1

## 2022-06-16 MED ORDER — ACETAMINOPHEN 500 MG PO TABS
1000.0000 mg | ORAL_TABLET | Freq: Once | ORAL | Status: AC
  Administered 2022-06-16: 1000 mg via ORAL
  Filled 2022-06-16: qty 2

## 2022-06-16 NOTE — ED Notes (Signed)
Discharge instructions were discussed with pt. Pt verbalized understanding with no questions at this time. Pt to follow up with OBGYN

## 2022-06-16 NOTE — ED Triage Notes (Signed)
Reports started having heavy bleeding and clots this morning around 530.  Also complains of vaginal pain.  2 midols and hydrocodone and still having pain. Reports 2 pads every hour.

## 2022-06-16 NOTE — ED Provider Notes (Signed)
[facility] EMERGENCY DEPARTMENT Provider Note   CSN: [id number] Arrival date & time: 06/16/22  1208     History  Chief Complaint  Patient presents with   Vaginal Bleeding    Esterlene Holte is a 36 y.o. female.  Patient c/o vaginal bleeding in past day, and mild period type cramping. Last period ~ a month ago. States uses pads - partially saturates pad q 2-3 hours or so. Denies hx uterine fiboirds or ovarian cyst. Denies OCP or other contraceptive or hormone use. No other abnormal bruising or bleeding - no anticoag use. No faintness or dizziness. No sob. No constant and/or severe abdominal or pelvic pain.   The history is provided by the patient and medical records.  Vaginal Bleeding Associated symptoms: no abdominal pain, no back pain, no dysuria, no fever and no vaginal discharge        Home Medications Prior to Admission medications   Medication Sig Start Date End Date Taking? Authorizing Provider  amLODipine (NORVASC) 5 MG tablet Take 1 tablet (5 mg total) by mouth daily. 05/25/19   Jene Every, MD  BIKTARVY 50-200-25 MG TABS tablet Take 1 tablet by mouth daily. 05/03/20   [provider]  diclofenac (VOLTAREN) 75 MG EC tablet Take 1 tablet (75 mg total) by mouth 2 (two) times daily. 04/30/22   Zenia Resides, MD  diclofenac (VOLTAREN) 75 MG EC tablet Take 1 tablet (75 mg total) by mouth 2 (two) times daily. 05/22/22   Elson Areas, PA-C  HYDROcodone-acetaminophen (NORCO/VICODIN) 5-325 MG tablet Take 1 tablet by mouth every 6 (six) hours as needed for moderate pain or severe pain. 05/22/22   Mardella Layman, MD  HYDROcodone-acetaminophen (NORCO/VICODIN) 5-325 MG tablet Take 1 tablet by mouth every 4 (four) hours as needed for moderate pain. 05/23/22 05/23/23  Elson Areas, PA-C  methocarbamol (ROBAXIN) 500 MG tablet Take 1 tablet (500 mg total) by mouth 4 (four) times daily. 05/22/22   Elson Areas, PA-C  potassium chloride (KLOR-CON) 10 MEQ tablet  TAKE 1 TABLET(10 MEQ) BY MOUTH TWICE DAILY 01/15/22   [provider]  predniSONE (DELTASONE) 10 MG tablet Take 2 tablets (20 mg total) by mouth daily. 05/27/22   Wynetta Fines, MD      Allergies    Aspirin    Review of Systems   Review of Systems  Constitutional:  Negative for fever.  HENT:  Negative for nosebleeds.   Respiratory:  Negative for shortness of breath.   Cardiovascular:  Negative for chest pain.  Gastrointestinal:  Negative for abdominal pain and blood in stool.  Genitourinary:  Positive for vaginal bleeding. Negative for dysuria, hematuria and vaginal discharge.  Musculoskeletal:  Negative for back pain.  Skin:  Negative for rash.  Neurological:  Negative for light-headedness.  Hematological:  Does not bruise/bleed easily.    Physical Exam Updated Vital Signs BP 121/65 (BP Location: Right Arm)   Pulse 68   Temp 98.4 F (36.9 C) (Oral)   Resp 18   Ht 1.702 m (5\' 7" )   Wt 90.7 kg   SpO2 100%   BMI 31.32 kg/m  Physical Exam Vitals and nursing note reviewed.  Constitutional:      Appearance: Normal appearance. She is well-developed.  HENT:     Head: Atraumatic.     Nose: Nose normal.     Mouth/Throat:     Mouth: Mucous membranes are moist.  Eyes:     General: No scleral icterus.  Conjunctiva/sclera: Conjunctivae normal.  Neck:     Trachea: No tracheal deviation.  Cardiovascular:     Rate and Rhythm: Normal rate.     Pulses: Normal pulses.  Pulmonary:     Effort: Pulmonary effort is normal. No respiratory distress.  Abdominal:     General: Bowel sounds are normal. There is no distension.     Palpations: Abdomen is soft. There is no mass.     Tenderness: There is no abdominal tenderness. There is no guarding.  Genitourinary:    Comments: No cva tenderness.  Musculoskeletal:        General: No swelling.     Cervical back: Neck supple. No muscular tenderness.     Right lower leg: No edema.     Left lower leg: No edema.  Skin:     General: Skin is warm and dry.     Coloration: Skin is not pale.     Findings: No rash.  Neurological:     Mental Status: She is alert.     Comments: Alert, speech normal.   Psychiatric:        Mood and Affect: Mood normal.     ED Results / Procedures / Treatments   Labs (all labs ordered are listed, but only abnormal results are displayed) Results for orders placed or performed during the hospital encounter of 06/16/22  CBC with Differential  Result Value Ref Range   WBC 7.5 4.0 - 10.5 K/uL   RBC 4.71 3.87 - 5.11 MIL/uL   Hemoglobin 13.1 12.0 - 15.0 g/dL   HCT 16.1 09.6 - 04.5 %   MCV 84.9 80.0 - 100.0 fL   MCH 27.8 26.0 - 34.0 pg   MCHC 32.8 30.0 - 36.0 g/dL   RDW 40.9 81.1 - 91.4 %   Platelets 352 150 - 400 K/uL   nRBC 0.0 0.0 - 0.2 %   Neutrophils Relative % 63 %   Neutro Abs 4.7 1.7 - 7.7 K/uL   Lymphocytes Relative 30 %   Lymphs Abs 2.3 0.7 - 4.0 K/uL   Monocytes Relative 6 %   Monocytes Absolute 0.5 0.1 - 1.0 K/uL   Eosinophils Relative 1 %   Eosinophils Absolute 0.1 0.0 - 0.5 K/uL   Basophils Relative 0 %   Basophils Absolute 0.0 0.0 - 0.1 K/uL   Immature Granulocytes 0 %   Abs Immature Granulocytes 0.01 0.00 - 0.07 K/uL  Comprehensive metabolic panel  Result Value Ref Range   Sodium 138 135 - 145 mmol/L   Potassium 3.7 3.5 - 5.1 mmol/L   Chloride 107 98 - 111 mmol/L   CO2 24 22 - 32 mmol/L   Glucose, Bld 124 (H) 70 - 99 mg/dL   BUN 11 6 - 20 mg/dL   Creatinine, Ser 7.82 0.44 - 1.00 mg/dL   Calcium 8.9 8.9 - 95.6 mg/dL   Total Protein 7.1 6.5 - 8.1 g/dL   Albumin 3.4 (L) 3.5 - 5.0 g/dL   AST 18 15 - 41 U/L   ALT 16 0 - 44 U/L   Alkaline Phosphatase 75 38 - 126 U/L   Total Bilirubin 0.5 0.3 - 1.2 mg/dL   GFR, Estimated >21 >30 mL/min   Anion gap 7 5 - 15  Lipase, blood  Result Value Ref Range   Lipase 34 11 - 51 U/L  Urinalysis, Routine w reflex microscopic  Result Value Ref Range   Color, Urine YELLOW YELLOW   APPearance HAZY (A) CLEAR  Specific Gravity, Urine 1.026 1.005 - 1.030   pH 5.0 5.0 - 8.0   Glucose, UA NEGATIVE NEGATIVE mg/dL   Hgb urine dipstick LARGE (A) NEGATIVE   Bilirubin Urine NEGATIVE NEGATIVE   Ketones, ur NEGATIVE NEGATIVE mg/dL   Protein, ur 30 (A) NEGATIVE mg/dL   Nitrite NEGATIVE NEGATIVE   Leukocytes,Ua MODERATE (A) NEGATIVE   RBC / HPF 11-20 0 - 5 RBC/hpf   WBC, UA 11-20 0 - 5 WBC/hpf   Bacteria, UA RARE (A) NONE SEEN   Squamous Epithelial / LPF 21-50 0 - 5   Mucus PRESENT   I-Stat beta hCG blood, ED  Result Value Ref Range   I-stat hCG, quantitative <5.0 <5 mIU/mL   Comment 3          Type and screen [facility]  Result Value Ref Range   ABO/RH(D) B POS    Antibody Screen NEG    Sample Expiration      06/19/2022,2359 Performed at [facility], [address] [place], [place] [address]    CT ABDOMEN PELVIS WO CONTRAST  Result Date: 05/27/2022 CLINICAL DATA:  Left flank pain EXAM: CT ABDOMEN AND PELVIS WITHOUT CONTRAST TECHNIQUE: Multidetector CT imaging of the abdomen and pelvis was performed following the standard protocol without IV contrast. RADIATION DOSE REDUCTION: This exam was performed according to the departmental dose-optimization program which includes automated exposure control, adjustment of the mA and/or kV according to patient size and/or use of iterative reconstruction technique. COMPARISON:  CT abdomen and pelvis dated February 25, 2022 FINDINGS: Lower chest: No acute abnormality. Hepatobiliary: No focal liver abnormality is seen. No gallstones, gallbladder wall thickening, or biliary dilatation. Pancreas: Unremarkable. No pancreatic ductal dilatation or surrounding inflammatory changes. Spleen: Normal in size without focal abnormality. Adrenals/Urinary Tract: Adrenal glands are unremarkable. Kidneys are normal, without renal calculi, focal lesion, or hydronephrosis. Bladder is unremarkable. Stomach/Bowel: Stomach is within normal limits. Appendix appears  normal. No evidence of bowel wall thickening, distention, or inflammatory changes. Vascular/Lymphatic: No significant vascular findings are present. No enlarged abdominal or pelvic lymph nodes. Reproductive: Uterus and bilateral adnexa are unremarkable. Other: Periumbilical scarring, unchanged when compared with prior exam. No abdominopelvic ascites. Musculoskeletal: No acute or significant osseous findings. IMPRESSION: No acute findings in the abdomen or pelvis, including no evidence of obstructive uropathy. Electronically Signed   By: Allegra Lai M.D.   On: 05/27/2022 12:59      EKG None  Radiology No results found.  Procedures Procedures    Medications Ordered in ED Medications  ondansetron (ZOFRAN-ODT) disintegrating tablet 4 mg (4 mg Oral Given 06/16/22 1222)  acetaminophen (TYLENOL) tablet 1,000 mg (1,000 mg Oral Given 06/16/22 1924)    ED Course/ Medical Decision Making/ A&P                           Medical Decision Making Risk OTC drugs.   Iv ns. Continuous pulse ox and cardiac monitoring. Labs ordered/sent.  Reviewed nursing notes and prior charts for additional history. Prior imaging studies reviewed from this year - no uterine fibroids or other abnormality noted. External reports reviewed.   Cardiac monitor: sinus rhythm, rate 68.  Labs reviewed/interpreted by me - hct 40, normal. Preg negative.   Acetaminophen po. Po fluids.   While in ED, no recurrent/heavy bleeding. Hct normal. Hr 68, rr 16, pulse ox 100%. Abd soft non tender.   Pt appear stable for d/c.   Rec  close ob/gyn f/u.  Return precautions provided.            Final Clinical Impression(s) / ED Diagnoses Final diagnoses:  None    Rx / DC Orders ED Discharge Orders     None         Cathren Laine, MD 06/16/22 1946

## 2022-06-16 NOTE — Discharge Instructions (Addendum)
It was our pleasure to provide your ER care today - we hope that you feel better.  Drink plenty of fluids/stay well hydrated. Take acetaminophen as need.   Follow up closely with ob/gyn doctor this week - call office tomorrow AM to arrange appointment.  Return to ER if worse, new symptoms, fevers, severe pain, persistent vomiting, trouble breathing, weak/fainting, or other concern.

## 2022-06-16 NOTE — ED Provider Triage Note (Signed)
Emergency Medicine Provider Triage Evaluation Note  Penny Young , a 36 y.o. female  was evaluated in triage.  Pt complains of menorrhagia onset this morning.  Notes that this is her second menstrual cycle this month.  Notes that she is going to 3 overnight pads in 1 hour.  Also has suprapubic abdominal pain that she rates 10/10.  Has nausea and dizziness as well.  No meds tried prior to arrival.  Denies vomiting.  Denies have an OB/GYN at this time.   Review of Systems  Positive: As per HPI above Negative:   Physical Exam  BP 134/82 (BP Location: Right Arm)   Pulse 88   Temp 98.4 F (36.9 C) (Oral)   Resp 18   SpO2 100%  Gen:   Awake, no distress   Resp:  Normal effort  MSK:   Moves extremities without difficulty  Other:  GU exam deferred in triage.  Lower abdominal tenderness to palpation.  Medical Decision Making  Medically screening exam initiated at 12:15 PM.  Appropriate orders placed.  Laiken Driggs was informed that the remainder of the evaluation will be completed by another provider, this initial triage assessment does not replace that evaluation, and the importance of remaining in the ED until their evaluation is complete.  Work-up initiated.   Keeton Kassebaum A, PA-C 06/16/22 1222

## 2022-06-26 ENCOUNTER — Encounter (HOSPITAL_COMMUNITY): Payer: Self-pay | Admitting: Emergency Medicine

## 2022-06-26 ENCOUNTER — Ambulatory Visit (HOSPITAL_COMMUNITY)
Admission: EM | Admit: 2022-06-26 | Discharge: 2022-06-26 | Disposition: A | Discharge status: Home / Self Care | Payer: Medicaid Other | Attending: Family Medicine | Admitting: Family Medicine

## 2022-06-26 DIAGNOSIS — Z20822 Contact with and (suspected) exposure to covid-19: Secondary | ICD-10-CM | POA: Insufficient documentation

## 2022-06-26 NOTE — ED Triage Notes (Signed)
Patient states that her sister was diagnosed with COVID x 2 days ago and her job is requesting that she be tested and return with a note from a doctor.

## 2022-06-26 NOTE — Discharge Instructions (Addendum)
You have been tested for COVID-19 today. °If your test returns positive, you will receive a phone call from [facility] regarding your results. °Negative test results are not called. °Both positive and negative results area always visible on MyChart. °If you do not have a MyChart account, sign up instructions are provided in your discharge papers. °Please do not hesitate to contact us should you have questions or concerns. ° °

## 2022-06-26 NOTE — ED Provider Notes (Signed)
  [facility]   [id number] 06/26/22 Arrival Time: 1206  ASSESSMENT & PLAN:  1. Exposure to COVID-19 virus    Discussed typical duration of viral illnesses. Viral testing sent. OTC symptom care as needed.  Work note provided.  Reviewed expectations re: course of current medical issues. Questions answered. Outlined signs and symptoms indicating need for more acute intervention. Understanding verbalized. After Visit Summary given.   SUBJECTIVE: History from: Patient. Penny Young is a 36 y.o. female. Reports: COVID exposure. Denies: runny nose, congestion, fever, cough, sore throat, difficulty breathing, and headache. Normal PO intake without n/v/d.  OBJECTIVE:  Vitals:   06/26/22 1335  BP: 132/81  Pulse: 89  Temp: 98.7 F (37.1 C)  TempSrc: Oral  SpO2: 97%    General appearance: alert; no distress Eyes: PERRLA; EOMI; conjunctiva normal HENT: [place]; AT; without nasal congestion Neck: supple  Lungs: speaks full sentences without difficulty; unlabored Extremities: no edema Skin: warm and dry Neurologic: normal gait Psychological: alert and cooperative; normal mood and affect  Pending:  Labs Reviewed  SARS CORONAVIRUS 2 (TAT 6-24 HRS)    Imaging: No results found.  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   Drug abuse (HCC)    Hepatitis B    HIV (human immunodeficiency virus infection) (HCC)    Hypertension    Obese    Restless leg syndrome    Schizophrenia (HCC)    Social History   Socioeconomic History   Marital status: Single    Spouse name: Not on file   Number of children: 1   Years of education: Not on file   Highest education level: Not on file  Occupational History   Not on file  Tobacco Use   Smoking status: Every Day    Packs/day: 0.10    Types: Cigarettes    Last attempt to quit: 11/26/2007    Years  since quitting: 14.5   Smokeless tobacco: Never  Vaping Use   Vaping Use: Never used  Substance and Sexual Activity   Alcohol use: Not Currently    Comment: occ   Drug use: Yes    Types: Marijuana    Comment: past abuse   Sexual activity: Not Currently  Other Topics Concern   Not on file  Social History Narrative   Not on file   Social Determinants of Health   Financial Resource Strain: Not on file  Food Insecurity: Not on file  Transportation Needs: Not on file  Physical Activity: Not on file  Stress: Not on file  Social Connections: Not on file  Intimate Partner Violence: Not on file   Family History  Problem Relation Age of Onset   Hypertension Other    Past Surgical History:  Procedure Laterality Date   HERNIA REPAIR     TUBAL LIGATION     TYMPANOSTOMY TUBE PLACEMENT       Mardella Layman, MD 06/26/22 1407

## 2022-06-27 LAB — SARS CORONAVIRUS 2 (TAT 6-24 HRS): SARS Coronavirus 2: NEGATIVE

## 2022-09-24 ENCOUNTER — Ambulatory Visit
Admission: EM | Admit: 2022-09-24 | Discharge: 2022-09-24 | Disposition: A | Discharge status: Home / Self Care | Payer: BC Managed Care – PPO | Attending: Internal Medicine | Admitting: Internal Medicine

## 2022-09-24 ENCOUNTER — Ambulatory Visit (INDEPENDENT_AMBULATORY_CARE_PROVIDER_SITE_OTHER): Payer: BC Managed Care – PPO

## 2022-09-24 DIAGNOSIS — R059 Cough, unspecified: Secondary | ICD-10-CM

## 2022-09-24 DIAGNOSIS — R0602 Shortness of breath: Secondary | ICD-10-CM

## 2022-09-24 DIAGNOSIS — R053 Chronic cough: Secondary | ICD-10-CM | POA: Diagnosis not present

## 2022-09-24 DIAGNOSIS — J069 Acute upper respiratory infection, unspecified: Secondary | ICD-10-CM

## 2022-09-24 MED ORDER — ALBUTEROL SULFATE HFA 108 (90 BASE) MCG/ACT IN AERS: 1.0000 | INHALATION_SPRAY | Freq: Four times a day (QID) | RESPIRATORY_TRACT | 0 refills | Status: AC | PRN

## 2022-09-24 MED ORDER — BENZONATATE 100 MG PO CAPS: 100.0000 mg | ORAL_CAPSULE | Freq: Three times a day (TID) | ORAL | 0 refills | Status: DC | PRN

## 2022-09-24 MED ORDER — PREDNISONE 20 MG PO TABS: 40.0000 mg | ORAL_TABLET | Freq: Every day | ORAL | 0 refills | Status: AC

## 2022-09-24 MED ORDER — DOXYCYCLINE HYCLATE 100 MG PO CAPS: 100.0000 mg | ORAL_CAPSULE | Freq: Two times a day (BID) | ORAL | 0 refills | Status: DC

## 2022-09-24 NOTE — Discharge Instructions (Signed)
Your chest x-ray was normal.  It appears that you may have possibly have bronchitis.  I am treating you with albuterol inhaler, prednisone, cough medication, antibiotic.  Please follow-up if symptoms persist or worsen.

## 2022-09-24 NOTE — ED Triage Notes (Signed)
[facility] with [facility] of cough congestion for one month today she started to worsen with sob. Pt reports black phlegm when she coughs.

## 2022-09-24 NOTE — ED Provider Notes (Signed)
[facility]    CSN: [id number] Arrival date & time: 09/24/22  1743      History   Chief Complaint Chief Complaint  Patient presents with   Cough   Nasal Congestion   Shortness of Breath    HPI Penny Young is a 36 y.o. female.   Patient presents for nasal congestion and productive cough that has been present for about a month.  Patient reports that she has coughing up dark-colored sputum.  She developed shortness of breath today.  Denies any known fevers at home.  Patient denies history of asthma or COPD but does report chronic bronchitis.  She also smokes cigarettes.  She has not taken any medications to help alleviate symptoms.  Denies chest pain, sore throat, ear pain, nausea, vomiting, diarrhea, abdominal pain.  Pertinent medical history includes history of HIV where patient takes Biktarvy and reports that last blood work was undetectable.   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 Authorizing Provider  albuterol (VENTOLIN HFA) 108 (90 Base) MCG/ACT inhaler Inhale 1-2 puffs into the lungs every 6 (six) hours as needed for wheezing or shortness of breath. 09/24/22  Yes  , Rolly Salter E, FNP  benzonatate (TESSALON) 100 MG capsule Take 1 capsule (100 mg total) by mouth every 8 (eight) hours as needed for cough. 09/24/22  Yes , Rolly Salter E, FNP  doxycycline (VIBRAMYCIN) 100 MG capsule Take 1 capsule (100 mg total) by mouth 2 (two) times daily. 09/24/22  Yes , Rolly Salter E, FNP  predniSONE (DELTASONE) 20 MG tablet Take 2 tablets (40 mg total) by mouth daily for 5 days. 09/24/22 09/29/22 Yes , Acie Fredrickson, FNP  amLODipine (NORVASC) 5 MG tablet Take 1 tablet (5 mg total) by mouth daily. 05/25/19   Jene Every, MD  BIKTARVY 50-200-25 MG TABS tablet Take 1 tablet by mouth daily. 05/03/20   [provider]  diclofenac (VOLTAREN) 75 MG EC tablet Take 1 tablet (75 mg total) by mouth 2 (two) times daily. 04/30/22   Zenia Resides, MD  diclofenac (VOLTAREN) 75 MG EC tablet Take 1 tablet (75 mg total) by mouth 2 (two) times daily. 05/22/22   Elson Areas, PA-C  HYDROcodone-acetaminophen (NORCO/VICODIN) 5-325 MG tablet Take 1  tablet by mouth every 6 (six) hours as needed for moderate pain or severe pain. 05/22/22   Mardella Layman, MD  HYDROcodone-acetaminophen (NORCO/VICODIN) 5-325 MG tablet Take 1 tablet by mouth every 4 (four) hours as needed for moderate pain. 05/23/22 05/23/23  Elson Areas, PA-C  methocarbamol (ROBAXIN) 500 MG tablet Take 1 tablet (500 mg total) by mouth 4 (four) times daily. 05/22/22   Elson Areas, PA-C  potassium chloride (KLOR-CON) 10 MEQ tablet TAKE 1 TABLET(10 MEQ) BY MOUTH TWICE DAILY 01/15/22   [provider]    Family History Family History  Problem Relation Age of Onset   Hypertension Other     Social History Social History   Tobacco Use   Smoking status: Every Day    Packs/day: 0.10    Types: Cigarettes    Last attempt to quit: 11/26/2007    Years since quitting: 14.8   Smokeless tobacco: Never  Vaping Use   Vaping Use: Never used  Substance Use Topics   Alcohol use: Not Currently    Comment: occ   Drug use:  Yes    Types: Marijuana    Comment: past abuse     Allergies   Aspirin   Review of Systems Review of Systems Per HPI  Physical Exam Triage Vital Signs ED Triage Vitals  Enc Vitals Group     BP 09/24/22 1800 124/82     Pulse Rate 09/24/22 1800 79     Resp 09/24/22 1800 20     Temp 09/24/22 1800 98.4 F (36.9 C)     Temp Source 09/24/22 1800 Oral     SpO2 09/24/22 1800 97 %     Weight --      Height --      Head Circumference --      Peak Flow --      Pain Score 09/24/22 1806 0     Pain Loc --      Pain Edu? --      Excl. in GC? --    No data found.  Updated Vital Signs BP 124/82 (BP Location: Left Arm)   Pulse 79   Temp 98.4 F (36.9 C) (Oral)   Resp 20   LMP 09/10/2022   SpO2 97%   Visual Acuity Right Eye Distance:   Left Eye Distance:   Bilateral Distance:    Right Eye Near:   Left Eye Near:    Bilateral Near:     Physical Exam Constitutional:      General: She is not in acute distress.    Appearance: Normal appearance. She is not toxic-appearing or diaphoretic.  HENT:     Head: Normocephalic and atraumatic.     Right Ear: Tympanic membrane and ear canal normal.     Left Ear: Tympanic membrane and ear canal normal.     Nose: Congestion present.     Mouth/Throat:     Mouth: Mucous membranes are moist.     Pharynx: No posterior oropharyngeal erythema.  Eyes:     Extraocular Movements: Extraocular movements intact.     Conjunctiva/sclera: Conjunctivae normal.     Pupils: Pupils are equal, round, and reactive to light.  Cardiovascular:     Rate and Rhythm: Normal rate and regular rhythm.     Pulses: Normal pulses.     Heart sounds: Normal heart sounds.  Pulmonary:     Effort: Pulmonary effort is normal. No respiratory distress.     Breath sounds: Normal breath sounds. No  stridor. No wheezing, rhonchi or rales.  Abdominal:     General: Abdomen is flat. Bowel sounds are normal.     Palpations: Abdomen is soft.  Musculoskeletal:         General: Normal range of motion.     Cervical back: Normal range of motion.  Skin:    General: Skin is warm and dry.  Neurological:     General: No focal deficit present.     Mental Status: She is alert and oriented to person, place, and time. Mental status is at baseline.  Psychiatric:        Mood and Affect: Mood normal.        Behavior: Behavior normal.      UC Treatments / Results  Labs (all labs ordered are listed, but only abnormal results are displayed) Labs Reviewed - No data to display  EKG   Radiology DG Chest 2 View  Result Date: 09/24/2022 CLINICAL DATA:  Shortness of breath, cough. EXAM: CHEST - 2 VIEW COMPARISON:  April 30, 2022. FINDINGS: The heart size and mediastinal contours are within normal limits. Both lungs are clear. The visualized skeletal structures are unremarkable. IMPRESSION: No active cardiopulmonary disease. Electronically Signed   By: Marijo Conception M.D.   On: 09/24/2022 18:43    Procedures Procedures (including critical care time)  Medications Ordered in UC Medications - No data to display  Initial Impression / Assessment and Plan / UC Course  I have reviewed the triage vital signs and the nursing notes.  Pertinent labs & imaging results that were available during my care of the patient were reviewed by me and considered in my medical decision making (see chart for details).     Patient has persistent upper respiratory infection and cough has been present for about a month.  Chest x-ray completed today due to this that was negative for any acute cardiopulmonary process.  Suspect the patient could have bronchitis and there is concern for secondary bacterial sinus infection given persistent upper respiratory symptoms.  Therefore, will treat with doxycycline antibiotic.  I am treating with prednisone and albuterol inhaler as well given shortness of breath and persistent cough to decrease inflammation and open up airways.  Benzonatate prescribed to  take as needed for cough as well.  No signs of respiratory distress on exam and oxygen is normal so do not think that emergent evaluation is necessary.  Patient has taken prednisone previously and tolerated well.  It appears the patient's previous lab work for HIV is normal so prednisone burst should be safe.  Patient was advised to follow-up if symptoms persist or worsen and was given strict ER precautions.  Patient verbalized understanding and was agreeable with plan. Final Clinical Impressions(s) / UC Diagnoses   Final diagnoses:  Acute upper respiratory infection  Persistent cough for 3 weeks or longer  Shortness of breath     Discharge Instructions      Your chest x-ray was normal.  It appears that you may have possibly have bronchitis.  I am treating you with albuterol inhaler, prednisone, cough medication, antibiotic.  Please follow-up if symptoms persist or worsen.     ED Prescriptions     Medication Sig Dispense Auth. Provider   doxycycline (VIBRAMYCIN) 100 MG capsule Take 1 capsule (100 mg total) by mouth 2 (two) times daily. 20 capsule [place], [place] E, [place]   predniSONE (DELTASONE) 20 MG tablet Take 2 tablets (40 mg total) by mouth daily for 5 days. 10 tablet  , [place] E, FNP   benzonatate (TESSALON) 100 MG capsule Take 1 capsule (100 mg total) by mouth every 8 (eight) hours as needed for cough. 21 capsule [place], [place] E, [place]   albuterol (VENTOLIN HFA) 108 (90 Base) MCG/ACT inhaler Inhale 1-2 puffs into the lungs every 6 (six) hours as needed for wheezing or shortness of breath. 1 each Gustavus Bryant, [place]      PDMP not reviewed this encounter.   Gustavus Bryant, [place] 09/24/22 [phone]

## 2022-10-21 ENCOUNTER — Other Ambulatory Visit: Payer: Self-pay

## 2022-10-21 ENCOUNTER — Encounter: Payer: Self-pay | Admitting: Emergency Medicine

## 2022-10-21 ENCOUNTER — Ambulatory Visit
Admission: EM | Admit: 2022-10-21 | Discharge: 2022-10-21 | Disposition: A | Discharge status: Home / Self Care | Payer: BC Managed Care – PPO | Attending: Internal Medicine | Admitting: Internal Medicine

## 2022-10-21 DIAGNOSIS — L02419 Cutaneous abscess of limb, unspecified: Secondary | ICD-10-CM | POA: Diagnosis not present

## 2022-10-21 MED ORDER — SULFAMETHOXAZOLE-TRIMETHOPRIM 800-160 MG PO TABS: 1.0000 | ORAL_TABLET | Freq: Two times a day (BID) | ORAL | 0 refills | Status: DC

## 2022-10-21 MED ORDER — SULFAMETHOXAZOLE-TRIMETHOPRIM 800-160 MG PO TABS: 1.0000 | ORAL_TABLET | Freq: Two times a day (BID) | ORAL | 0 refills | Status: AC

## 2022-10-21 NOTE — Discharge Instructions (Signed)
I have prescribed an antibiotic to treat the abscess of your skin.  Please use warm compresses as we discussed.  Follow-up if symptoms persist or worsen.

## 2022-10-21 NOTE — ED Provider Notes (Signed)
[facility]    CSN: [id number] Arrival date & time: 10/21/22  1602      History   Chief Complaint Chief Complaint  Patient presents with   Abscess    HPI Marguita Opfer is a 36 y.o. female.   Patient presents with abscess to left hip area that started about 3 days ago.  She reports that she has had some purulent drainage coming from the area.  Denies any associated fever, body aches approximately 2 minutes, chills.  Denies history of the same.   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 Authorizing Provider  albuterol (VENTOLIN HFA) 108 (90 Base) MCG/ACT inhaler Inhale 1-2 puffs into the lungs every 6 (six) hours as needed for wheezing or shortness of breath. 09/24/22   Gustavus Bryant, FNP  amLODipine (NORVASC) 5 MG tablet Take 1 tablet (5 mg total) by mouth daily. 05/25/19   Jene Every, MD  benzonatate (TESSALON) 100 MG capsule Take 1 capsule (100 mg total) by mouth every 8 (eight) hours as needed for cough. 09/24/22   , Katherina Wimer E, FNP  BIKTARVY 50-200-25 MG TABS tablet Take 1 tablet by mouth daily. 05/03/20   [provider]  diclofenac (VOLTAREN) 75 MG EC tablet Take 1 tablet  (75 mg total) by mouth 2 (two) times daily. 04/30/22   Zenia Resides, MD  diclofenac (VOLTAREN) 75 MG EC tablet Take 1 tablet (75 mg total) by mouth 2 (two) times daily. Patient not taking: Reported on 10/21/2022 05/22/22   Elson Areas, PA-C  HYDROcodone-acetaminophen (NORCO/VICODIN) 5-325 MG tablet Take 1 tablet by mouth every 6 (six) hours as needed for moderate pain or severe pain. 05/22/22   Mardella Layman, MD  HYDROcodone-acetaminophen (NORCO/VICODIN) 5-325 MG tablet Take 1 tablet by mouth every 4 (four) hours as needed for moderate pain. 05/23/22 05/23/23  Elson Areas, PA-C  methocarbamol (ROBAXIN) 500 MG tablet Take 1 tablet (500 mg total) by mouth 4 (four) times daily. 05/22/22   Elson Areas, PA-C  potassium chloride (KLOR-CON) 10 MEQ tablet TAKE 1 TABLET(10 MEQ) BY MOUTH TWICE DAILY 01/15/22   [provider]  sulfamethoxazole-trimethoprim (BACTRIM DS) 800-160 MG tablet Take 1 tablet by mouth 2 (two) times daily for 7 days.  10/21/22 10/28/22  Gustavus Bryant, FNP    Family History Family History  Problem Relation Age of Onset   Hypertension Other     Social History Social History   Tobacco Use   Smoking status: Every Day    Packs/day: 0.10    Types: Cigarettes    Last attempt to quit: 11/26/2007    Years since quitting: 14.9   Smokeless tobacco: Never  Vaping Use   Vaping Use: Never used  Substance Use Topics   Alcohol use: Not Currently    Comment: occ   Drug use: Yes    Types: Marijuana    Comment: past abuse     Allergies   Aspirin   Review of Systems Review of Systems Per HPI  Physical Exam Triage Vital Signs ED Triage Vitals [10/21/22 1745]  Enc Vitals Group     BP 125/73     Pulse Rate 88     Resp 18     Temp 98.1 F (36.7 C)     Temp Source Oral     SpO2 98 %     Weight      Height      Head Circumference      Peak Flow      Pain Score 6     Pain Loc      Pain Edu?      Excl. in GC?    No data found.  Updated Vital  Signs BP 125/73 (BP Location: Left Arm)   Pulse 88   Temp 98.1 F (36.7 C) (Oral)   Resp 18   LMP 09/10/2022   SpO2 98%   Visual Acuity Right Eye Distance:   Left Eye Distance:   Bilateral Distance:    Right Eye Near:   Left Eye Near:    Bilateral Near:     Physical Exam Constitutional:      General: She is not in acute distress.    Appearance: Normal appearance. She is not toxic-appearing or diaphoretic.  HENT:     Head: Normocephalic and atraumatic.  Eyes:     Extraocular Movements: Extraocular movements intact.     Conjunctiva/sclera: Conjunctivae normal.  Pulmonary:     Effort: Pulmonary effort is normal.  Skin:    Comments: Approximately 2 inch by 2 inch indurated, flat abscess present to left upper hip. No drainage noted.   Neurological:     General: No focal deficit present.     Mental Status: She is alert and oriented to person, place, and time. Mental status is at baseline.  Psychiatric:        Mood and Affect: Mood normal.        Behavior: Behavior normal.        Thought Content: Thought content normal.        Judgment: Judgment normal.      UC Treatments / Results  Labs (all labs ordered are listed, but only abnormal results are displayed) Labs Reviewed - No data to display  EKG   Radiology No results found.  Procedures Procedures (including critical care time)  Medications Ordered in UC Medications - No data to display  Initial Impression / Assessment and Plan / UC Course  I have reviewed the triage vital signs and the nursing notes.  Pertinent labs & imaging results that were available during my care of the patient were reviewed by me and considered in my medical decision making (see chart for details).     Patient has abscess  to left hip area.  It is not conducive to drainage given that it is very indurated.  Will treat with Bactrim antibiotic.  Patient recently treated with doxycycline and has completed this.  Advised patient of warm  compresses.  Patient advised to follow-up if symptoms persist or worsen.  Patient verbalized understanding and was agreeable with plan. Final Clinical Impressions(s) / UC Diagnoses   Final diagnoses:  Abscess of hip     Discharge Instructions      I have prescribed an antibiotic to treat the abscess of your skin.  Please use warm compresses as we discussed.  Follow-up if symptoms persist or worsen.    ED Prescriptions     Medication Sig Dispense Auth. Provider   sulfamethoxazole-trimethoprim (BACTRIM DS) 800-160 MG tablet  (Status: Discontinued) Take 1 tablet by mouth 2 (two) times daily for 7 days. 14 tablet [place], [place] E, [place]   sulfamethoxazole-trimethoprim (BACTRIM DS) 800-160 MG tablet Take 1 tablet by mouth 2 (two) times daily for 7 days. 14 tablet [place], Acie Fredrickson, [place]      PDMP not reviewed this encounter.   Gustavus Bryant, [place] 10/21/22 [phone]

## 2022-10-21 NOTE — ED Triage Notes (Signed)
Pt with abscess to left hip area with some pain, swelling and drainage

## 2023-02-18 ENCOUNTER — Encounter: Payer: Self-pay | Admitting: Emergency Medicine

## 2023-02-18 ENCOUNTER — Ambulatory Visit
Admission: EM | Admit: 2023-02-18 | Discharge: 2023-02-18 | Disposition: A | Discharge status: Home / Self Care | Payer: BC Managed Care – PPO | Attending: Family Medicine | Admitting: Family Medicine

## 2023-02-18 ENCOUNTER — Other Ambulatory Visit: Payer: Self-pay

## 2023-02-18 DIAGNOSIS — M5441 Lumbago with sciatica, right side: Secondary | ICD-10-CM | POA: Diagnosis not present

## 2023-02-18 MED ORDER — PREDNISONE 20 MG PO TABS: 40.0000 mg | ORAL_TABLET | Freq: Every day | ORAL | 0 refills | Status: AC

## 2023-02-18 MED ORDER — KETOROLAC TROMETHAMINE 30 MG/ML IJ SOLN
30.0000 mg | Freq: Once | INTRAMUSCULAR | Status: AC
  Administered 2023-02-18: 30 mg via INTRAMUSCULAR

## 2023-02-18 MED ORDER — METHYLPREDNISOLONE ACETATE 80 MG/ML IJ SUSP
80.0000 mg | Freq: Once | INTRAMUSCULAR | Status: AC
  Administered 2023-02-18: 80 mg via INTRAMUSCULAR

## 2023-02-18 MED ORDER — TIZANIDINE HCL 4 MG PO TABS: 4.0000 mg | ORAL_TABLET | Freq: Three times a day (TID) | ORAL | 0 refills | Status: DC | PRN

## 2023-02-18 NOTE — ED Triage Notes (Signed)
Pt here for right sided lower back pain with radiation to leg x 3 weeks

## 2023-02-18 NOTE — ED Provider Notes (Signed)
[facility]    CSN: [id number] Arrival date & time: 02/18/23  1302      History   Chief Complaint Chief Complaint  Patient presents with   Back Pain    HPI Penny Young is a 37 y.o. female.   Patient is here for right low back pain that radiates into the right leg.  It goes down the whole right leg into the heel.  She also notes a knot at the back of the heel.  Pain comes/goes.  She does not have numbness/tingling, just shooting pain.  Has not happened before.  No known injury.  She works for [employer], not sure if she did anything while working .  She has taken tyelnol, mobic without much help.  She does not have a pcp, but goes to [facility] regularly.        Past Medical History:  Diagnosis Date   Asthma    Bipolar 1 disorder ([place])    CNS toxoplasmosis ([place]) 09/14/2019   Drug abuse ([place])    Hepatitis B    HIV (human immunodeficiency virus infection) ([place])    Hypertension    Obese    Restless leg syndrome    Schizophrenia [facility])     Patient Active Problem List   Diagnosis Date Noted   CNS toxoplasmosis ([place]) 09/14/2019   HIV DISEASE 04/27/2008    Past Surgical History:  Procedure Laterality Date   HERNIA REPAIR     TUBAL LIGATION     TYMPANOSTOMY TUBE PLACEMENT      OB History     Gravida      Para      Term      Preterm      AB      Living  4      SAB      IAB      Ectopic      Multiple      Live Births               Home Medications    Prior to Admission medications   Medication Sig Start Date End Date Taking? Authorizing Provider  albuterol (VENTOLIN HFA) 108 (90 Base) MCG/ACT inhaler Inhale 1-2 puffs into the lungs every 6 (six) hours as needed for wheezing or shortness of breath. 09/24/22   Teodora Medici, FNP  amLODipine (NORVASC) 5 MG tablet Take 1 tablet (5 mg total) by mouth daily. 05/25/19   Lavonia Drafts, MD  benzonatate (TESSALON) 100 MG capsule Take 1 capsule (100 mg total) by mouth every 8 (eight) hours as  needed for cough. 09/24/22   [place], Haley E, FNP  BIKTARVY 50-200-25 MG TABS tablet Take 1 tablet by mouth daily. 05/03/20   [provider]  diclofenac (VOLTAREN) 75 MG EC tablet Take 1 tablet (75 mg total) by mouth 2 (two) times daily. 04/30/22   Barrett Henle, MD  diclofenac (VOLTAREN) 75 MG EC tablet Take 1 tablet (75 mg total) by mouth 2 (two) times daily. Patient not taking: Reported on 10/21/2022 05/22/22   Fransico Meadow, PA-C  HYDROcodone-acetaminophen (NORCO/VICODIN) 5-325 MG tablet Take 1 tablet by mouth every 6 (six) hours as needed for moderate pain or severe pain. 05/22/22   Vanessa Kick, MD  HYDROcodone-acetaminophen (NORCO/VICODIN) 5-325 MG tablet Take 1 tablet by mouth every 4 (four) hours as needed for moderate pain. Patient not taking: Reported on 02/18/2023 05/23/22 05/23/23  Fransico Meadow, PA-C  methocarbamol (ROBAXIN) 500 MG tablet Take 1  tablet (500 mg total) by mouth 4 (four) times daily. 05/22/22   Fransico Meadow, PA-C  potassium chloride (KLOR-CON) 10 MEQ tablet TAKE 1 TABLET(10 MEQ) BY MOUTH TWICE DAILY 01/15/22   [provider]    Family History Family History  Problem Relation Age of Onset   Hypertension Other     Social History Social History   Tobacco Use   Smoking status: Every Day    Packs/day: .1    Types: Cigarettes    Last attempt to quit: 11/26/2007    Years since quitting: 15.2   Smokeless tobacco: Never  Vaping Use   Vaping Use: Never used  Substance Use Topics   Alcohol use: Not Currently    Comment: occ   Drug use: Yes    Types: Marijuana    Comment: past abuse     Allergies   Aspirin   Review of Systems Review of Systems  Constitutional: Negative.   HENT: Negative.    Respiratory: Negative.    Cardiovascular: Negative.   Gastrointestinal: Negative.   Musculoskeletal:  Positive for back pain.     Physical Exam Triage Vital Signs ED Triage Vitals [02/18/23 1334]  Enc Vitals Group     BP 136/87     Pulse  Rate 88     Resp 18     Temp 98.4 F (36.9 C)     Temp Source Oral     SpO2 98 %     Weight      Height      Head Circumference      Peak Flow      Pain Score 5     Pain Loc      Pain Edu?      Excl. in [place]?    No data found.  Updated Vital Signs BP 136/87 (BP Location: Left Arm)   Pulse 88   Temp 98.4 F (36.9 C) (Oral)   Resp 18   SpO2 98%   Visual Acuity Right Eye Distance:   Left Eye Distance:   Bilateral Distance:    Right Eye Near:   Left Eye Near:    Bilateral Near:     Physical Exam Constitutional:      Appearance: Normal appearance.  Musculoskeletal:     Comments: + TTP to the lumbar spine and the low back on the right;  decreased strength at the right LE due to pain.   Straight leg raise is negative today;   Neurological:     General: No focal deficit present.     Mental Status: She is alert.  Psychiatric:        Mood and Affect: Mood normal.      UC Treatments / Results  Labs (all labs ordered are listed, but only abnormal results are displayed) Labs Reviewed - No data to display  EKG   Radiology No results found.  Procedures Procedures (including critical care time)  Medications Ordered in UC Medications  ketorolac (TORADOL) 30 MG/ML injection 30 mg (has no administration in time range)  methylPREDNISolone acetate (DEPO-MEDROL) injection 80 mg (has no administration in time range)    Initial Impression / Assessment and Plan / UC Course  I have reviewed the triage vital signs and the nursing notes.  Pertinent labs & imaging results that were available during my care of the patient were reviewed by me and considered in my medical decision making (see chart for details).  Final Clinical Impressions(s) / UC Diagnoses   Final  diagnoses:  Acute right-sided low back pain with right-sided sciatica     Discharge Instructions      You were seen today for low back pain with radiation down the leg.  I have given you a shot of toradol  and steroid today.  I have sent out scripts to your pharmacy of oral prednisone (please start this tomorrow) and a muscle relaxer (this can make you sleepy so please take when home and not driving).  You may use tylenol or motrin for pain otherwise.  You may use a heating pad as well.  Please follow up with your doctor as scheduled to discuss further treatment if needed.     ED Prescriptions     Medication Sig Dispense Auth. Provider   predniSONE (DELTASONE) 20 MG tablet Take 2 tablets (40 mg total) by mouth daily for 5 days. 10 tablet Rylah Fukuda, MD   tiZANidine (ZANAFLEX) 4 MG tablet Take 1 tablet (4 mg total) by mouth every 8 (eight) hours as needed for muscle spasms. 30 tablet Rondel Oh, MD      PDMP not reviewed this encounter.   Rondel Oh, MD 02/18/23 [phone]

## 2023-02-18 NOTE — Discharge Instructions (Signed)
You were seen today for low back pain with radiation down the leg.  I have given you a shot of toradol and steroid today.  I have sent out scripts to your pharmacy of oral prednisone (please start this tomorrow) and a muscle relaxer (this can make you sleepy so please take when home and not driving).  You may use tylenol or motrin for pain otherwise.  You may use a heating pad as well.  Please follow up with your doctor as scheduled to discuss further treatment if needed.

## 2023-03-02 ENCOUNTER — Encounter (HOSPITAL_COMMUNITY): Payer: Self-pay

## 2023-03-02 ENCOUNTER — Other Ambulatory Visit: Payer: Self-pay

## 2023-03-02 ENCOUNTER — Emergency Department (HOSPITAL_COMMUNITY)
Admission: EM | Admit: 2023-03-02 | Discharge: 2023-03-03 | Disposition: A | Discharge status: Home / Self Care | Payer: BC Managed Care – PPO | Attending: Emergency Medicine | Admitting: Emergency Medicine

## 2023-03-02 DIAGNOSIS — Z21 Asymptomatic human immunodeficiency virus [HIV] infection status: Secondary | ICD-10-CM | POA: Insufficient documentation

## 2023-03-02 DIAGNOSIS — Z79899 Other long term (current) drug therapy: Secondary | ICD-10-CM | POA: Insufficient documentation

## 2023-03-02 DIAGNOSIS — R519 Headache, unspecified: Secondary | ICD-10-CM | POA: Diagnosis present

## 2023-03-02 LAB — CBC WITH DIFFERENTIAL/PLATELET
Abs Immature Granulocytes: 0.02 10*3/uL (ref 0.00–0.07)
Basophils Absolute: 0 10*3/uL (ref 0.0–0.1)
Basophils Relative: 0 %
Eosinophils Absolute: 0.1 10*3/uL (ref 0.0–0.5)
Eosinophils Relative: 1 %
HCT: 39.6 % (ref 36.0–46.0)
Hemoglobin: 13.3 g/dL (ref 12.0–15.0)
Immature Granulocytes: 0 %
Lymphocytes Relative: 34 %
Lymphs Abs: 2.3 10*3/uL (ref 0.7–4.0)
MCH: 28.6 pg (ref 26.0–34.0)
MCHC: 33.6 g/dL (ref 30.0–36.0)
MCV: 85.2 fL (ref 80.0–100.0)
Monocytes Absolute: 0.7 10*3/uL (ref 0.1–1.0)
Monocytes Relative: 10 %
Neutro Abs: 3.7 10*3/uL (ref 1.7–7.7)
Neutrophils Relative %: 55 %
Platelets: 345 10*3/uL (ref 150–400)
RBC: 4.65 MIL/uL (ref 3.87–5.11)
RDW: 13.8 % (ref 11.5–15.5)
WBC: 6.8 10*3/uL (ref 4.0–10.5)
nRBC: 0 % (ref 0.0–0.2)

## 2023-03-02 LAB — COMPREHENSIVE METABOLIC PANEL
ALT: 23 U/L (ref 0–44)
AST: 47 U/L — ABNORMAL HIGH (ref 15–41)
Albumin: 4 g/dL (ref 3.5–5.0)
Alkaline Phosphatase: 83 U/L (ref 38–126)
Anion gap: 13 (ref 5–15)
BUN: 16 mg/dL (ref 6–20)
CO2: 23 mmol/L (ref 22–32)
Calcium: 9.2 mg/dL (ref 8.9–10.3)
Chloride: 100 mmol/L (ref 98–111)
Creatinine, Ser: 0.8 mg/dL (ref 0.44–1.00)
GFR, Estimated: >60 mL/min (ref >60)
Glucose, Bld: 91 mg/dL (ref 70–99)
Potassium: 3.7 mmol/L (ref 3.5–5.1)
Sodium: 136 mmol/L (ref 135–145)
Total Bilirubin: 1.5 mg/dL — ABNORMAL HIGH (ref 0.3–1.2)
Total Protein: 7.9 g/dL (ref 6.5–8.1)

## 2023-03-02 LAB — HCG, QUANTITATIVE, PREGNANCY: hCG, Beta Chain, Quant, S: 1 m[IU]/mL (ref <5)

## 2023-03-02 MED ORDER — DROPERIDOL 2.5 MG/ML IJ SOLN
2.5000 mg | Freq: Once | INTRAMUSCULAR | Status: AC
  Administered 2023-03-02: 2.5 mg via INTRAVENOUS
  Filled 2023-03-02: qty 2

## 2023-03-02 MED ORDER — LORAZEPAM 2 MG/ML IJ SOLN: 2.0000 mg | Freq: Once | INTRAMUSCULAR | Status: DC | PRN

## 2023-03-02 MED ORDER — LACTATED RINGERS IV BOLUS
1000.0000 mL | Freq: Once | INTRAVENOUS | Status: AC
  Administered 2023-03-03: 1000 mL via INTRAVENOUS

## 2023-03-02 NOTE — ED Triage Notes (Signed)
Pt to ED by POV from home c/o severe headache onset of 3pm today. Pt has a history of the same and also has a history of brain lesions. Arrives A+O, VSS, Pt is restless and vocal in triage.

## 2023-03-02 NOTE — ED Provider Notes (Signed)
[place] [facility] AT [facility] Provider Note   CSN: [id number] Arrival date & time: 03/02/23  2304     History {Add pertinent medical, surgical, social history, OB history to HPI:1} Chief Complaint  Patient presents with   Headache    Penny Young is a 37 y.o. female.  History of HIV with intermittent noncompliance for at [facility] infectious disease, appears her last office visit was in March where her viral load was undetectable.  She has a history of having intermittent headaches but seems like today is the worst.  No fevers, nausea, vomiting, diarrhea, constipation or neurologic changes.  Started around 3:00 and has progressively worsened since then.  In the records it appears that she had toxoplasmosis in 2020 and in 2022 follow-up CT scan showed calcified lesions as a result.  I suspect these are the lesions of her brain that she is talking about.  Other states she supposed to be on some type of medication from [facility] for headaches but she has not been on it.  I am not clear what this is.  Mother also states that she does not take a lot of her medications that she supposed to.  Patient says that she does.  No recent trauma.   Headache      Home Medications Prior to Admission medications   Medication Sig Start Date End Date Taking? Authorizing Provider  albuterol (VENTOLIN HFA) 108 (90 Base) MCG/ACT inhaler Inhale 1-2 puffs into the lungs every 6 (six) hours as needed for wheezing or shortness of breath. 09/24/22   Gustavus Bryant, FNP  amLODipine (NORVASC) 5 MG tablet Take 1 tablet (5 mg total) by mouth daily. 05/25/19   Jene Every, MD  BIKTARVY 50-200-25 MG TABS tablet Take 1 tablet by mouth daily. 05/03/20   [provider]  diclofenac (VOLTAREN) 75 MG EC tablet Take 1 tablet (75 mg total) by mouth 2 (two) times daily. 04/30/22   Zenia Resides, MD  potassium chloride (KLOR-CON) 10 MEQ tablet TAKE 1 TABLET(10 MEQ) BY MOUTH TWICE  DAILY 01/15/22   [provider]  tiZANidine (ZANAFLEX) 4 MG tablet Take 1 tablet (4 mg total) by mouth every 8 (eight) hours as needed for muscle spasms. 02/18/23   Jannifer Franklin, MD      Allergies    Aspirin    Review of Systems   Review of Systems  Neurological:  Positive for headaches.    Physical Exam Updated Vital Signs BP (!) 150/86   Pulse (!) 111   Temp 98.2 F (36.8 C)   Resp 18   Ht 5\' 7"  (1.702 m)   Wt 90.7 kg   SpO2 100%   BMI 31.32 kg/m  Physical Exam Vitals and nursing note reviewed.  Constitutional:      Appearance: She is well-developed.     Comments: Patient rolling around on the bed stating that her head hurts.  Moving all of her extremities.  She is able to stop and participate in exam with direct commands.  Is able to ask for water but then goes back to clutching her head rolling around.  Moving her neck without any obvious rigidity or stiffness.  No pain elsewhere.  HENT:     Head: Normocephalic and atraumatic.  Cardiovascular:     Rate and Rhythm: Normal rate and regular rhythm.  Pulmonary:     Effort: No respiratory distress.     Breath sounds: No stridor.  Abdominal:  General: There is no distension.  Musculoskeletal:     Cervical back: Normal range of motion.  Neurological:     Mental Status: She is alert.     ED Results / Procedures / Treatments   Labs (all labs ordered are listed, but only abnormal results are displayed) Labs Reviewed  COMPREHENSIVE METABOLIC PANEL - Abnormal; Notable for the following components:      Result Value   AST 47 (*)    Total Bilirubin 1.5 (*)    All other components within normal limits  CBC WITH DIFFERENTIAL/PLATELET  HCG, QUANTITATIVE, PREGNANCY  PROTIME-INR    EKG None  Radiology No results found.  Procedures Procedures    Medications Ordered in ED Medications  droperidol (INAPSINE) 2.5 MG/ML injection 2.5 mg (has no administration in time range)    ED Course/ Medical  Decision Making/ A&P   {   Click here for ABCD2, HEART and other calculatorsREFRESH Note before signing :1}                          Medical Decision Making Amount and/or Complexity of Data Reviewed Labs: ordered. Radiology: ordered.  Risk Prescription drug management.  Will treat headache with droperidol, ativan prn for head CT. Labs mostly to check wbc and PT/INR in case she ends up needing an LP.  ***  {Document critical care time when appropriate:1} {Document review of labs and clinical decision tools ie heart score, Chads2Vasc2 etc:1}  {Document your independent review of radiology images, and any outside records:1} {Document your discussion with family members, caretakers, and with consultants:1} {Document social determinants of health affecting pt's care:1} {Document your decision making why or why not admission, treatments were needed:1} Final Clinical Impression(s) / ED Diagnoses Final diagnoses:  None    Rx / DC Orders ED Discharge Orders     None

## 2023-03-03 ENCOUNTER — Emergency Department (HOSPITAL_COMMUNITY): Payer: BC Managed Care – PPO

## 2023-03-03 DIAGNOSIS — R519 Headache, unspecified: Secondary | ICD-10-CM | POA: Diagnosis not present

## 2023-03-03 LAB — PROTIME-INR
INR: 1 (ref 0.8–1.2)
Prothrombin Time: 12.9 seconds (ref 11.4–15.2)

## 2023-03-10 ENCOUNTER — Encounter (HOSPITAL_COMMUNITY): Payer: Self-pay | Admitting: Emergency Medicine

## 2023-03-10 ENCOUNTER — Ambulatory Visit (HOSPITAL_COMMUNITY)
Admission: EM | Admit: 2023-03-10 | Discharge: 2023-03-10 | Disposition: A | Discharge status: Home / Self Care | Payer: BC Managed Care – PPO | Attending: Physician Assistant | Admitting: Physician Assistant

## 2023-03-10 ENCOUNTER — Other Ambulatory Visit: Payer: Self-pay

## 2023-03-10 DIAGNOSIS — N764 Abscess of vulva: Secondary | ICD-10-CM | POA: Diagnosis not present

## 2023-03-10 MED ORDER — SULFAMETHOXAZOLE-TRIMETHOPRIM 800-160 MG PO TABS: 1.0000 | ORAL_TABLET | Freq: Two times a day (BID) | ORAL | 0 refills | Status: AC

## 2023-03-10 NOTE — ED Triage Notes (Signed)
Reports 2 abscess in vaginal area and one on right buttocks.

## 2023-03-10 NOTE — ED Provider Notes (Signed)
[facility]    CSN: [id number] Arrival date & time: 03/10/23  1857      History   Chief Complaint Chief Complaint  Patient presents with   Abscess    HPI Penny Young is a 37 y.o. female.   Patient presents today with a several day history of 2 abscesses.  She reports her primary 1 is on her labia and is very painful.  She also has a small 1 on her right buttocks.  She reports that pain is rated 10 on a 0-10 pain scale, described as throbbing, no aggravating leaving factors notified.  She has tried warm compresses which has encourage drainage but has not tried any over-the-counter medications.  She denies history of recurrent skin infections or MRSA.  She does have a past medical history of HIV but is compliant with her antiviral therapy.  Last blood work obtained in care everywhere shows that she was nondetectable in March 2023.  She denies any fever, nausea, vomiting.  Denies any recent antibiotic use.    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 Authorizing Provider  sulfamethoxazole-trimethoprim (BACTRIM DS) 800-160 MG tablet Take 1 tablet by mouth 2 (two) times daily for 7 days. 03/10/23 03/17/23 Yes Alilah Mcmeans K, PA-C  albuterol (VENTOLIN  HFA) 108 (90 Base) MCG/ACT inhaler Inhale 1-2 puffs into the lungs every 6 (six) hours as needed for wheezing or shortness of breath. 09/24/22   Gustavus Bryant, FNP  amLODipine (NORVASC) 5 MG tablet Take 1 tablet (5 mg total) by mouth daily. 05/25/19   Jene Every, MD  BIKTARVY 50-200-25 MG TABS tablet Take 1 tablet by mouth daily. 05/03/20   [provider]  diclofenac (VOLTAREN) 75 MG EC tablet Take 1 tablet (75 mg total) by mouth 2 (two) times daily. Patient not taking: Reported on 03/10/2023 04/30/22   Zenia Resides, MD  potassium chloride (KLOR-CON) 10 MEQ tablet TAKE 1 TABLET(10 MEQ) BY MOUTH TWICE DAILY Patient not taking: Reported on 03/10/2023 01/15/22   [provider]  tiZANidine (ZANAFLEX) 4 MG tablet Take 1 tablet (4 mg total) by mouth every 8 (eight) hours as needed for muscle spasms. Patient not taking: Reported on 03/10/2023 02/18/23   Jannifer Franklin, MD    Family History Family History  Problem Relation Age  of Onset   Hypertension Other     Social History Social History   Tobacco Use   Smoking status: Every Day    Packs/day: .1    Types: Cigarettes    Last attempt to quit: 11/26/2007    Years since quitting: 15.2   Smokeless tobacco: Never  Vaping Use   Vaping Use: Never used  Substance Use Topics   Alcohol use: Not Currently    Comment: occ   Drug use: Yes    Types: Marijuana    Comment: past abuse     Allergies   Aspirin   Review of Systems Review of Systems  Constitutional:  Positive for activity change. Negative for appetite change, fatigue and fever.  Respiratory:  Negative for cough and shortness of breath.   Cardiovascular:  Negative for chest pain.  Gastrointestinal:  Negative for abdominal pain, diarrhea, nausea and vomiting.  Genitourinary:  Positive for vaginal pain. Negative for pelvic pain, vaginal bleeding and vaginal discharge.  Skin:  Positive for color change and wound.     Physical Exam Triage Vital Signs ED Triage  Vitals  Enc Vitals Group     BP 03/10/23 1947 (!) 134/91     Pulse Rate 03/10/23 1947 96     Resp 03/10/23 1947 20     Temp 03/10/23 1947 98.5 F (36.9 C)     Temp Source 03/10/23 1947 Oral     SpO2 03/10/23 1947 96 %     Weight --      Height --      Head Circumference --      Peak Flow --      Pain Score 03/10/23 1944 10     Pain Loc --      Pain Edu? --      Excl. in GC? --    No data found.  Updated Vital Signs BP (!) 134/91 (BP Location: Right Arm)   Pulse 96   Temp 98.5 F (36.9 C) (Oral)   Resp 20   LMP 02/21/2023   SpO2 96%   Visual Acuity Right Eye Distance:   Left Eye Distance:   Bilateral Distance:    Right Eye Near:   Left Eye Near:    Bilateral Near:     Physical Exam Vitals reviewed.  Constitutional:      General: She is awake. She is not in acute distress.    Appearance: Normal appearance. She is well-developed. She is not ill-appearing.     Comments: Very pleasant female appears stated age in no acute distress sitting comfortably in exam room  HENT:     Head: Normocephalic and atraumatic.  Cardiovascular:     Rate and Rhythm: Normal rate and regular rhythm.     Heart sounds: Normal heart sounds, S1 normal and S2 normal. No murmur heard. Pulmonary:     Effort: Pulmonary effort is normal.     Breath sounds: Normal breath sounds. No wheezing, rhonchi or rales.     Comments: Clear to auscultation laterally Abdominal:     General: Bowel sounds are normal.     Palpations: Abdomen is soft.     Tenderness: There is no abdominal tenderness.  Genitourinary:    Labia:        Right: Lesion present.        Left: No lesion.        Comments: 5 cm x 3 cm erythematous nodule on labia majora with active purulent drainage. Psychiatric:  Behavior: Behavior is cooperative.      UC Treatments / Results  Labs (all labs ordered are listed, but only abnormal results are displayed) Labs Reviewed - No data to display  EKG   Radiology No  results found.  Procedures Procedures (including critical care time)  Medications Ordered in UC Medications - No data to display  Initial Impression / Assessment and Plan / UC Course  I have reviewed the triage vital signs and the nursing notes.  Pertinent labs & imaging results that were available during my care of the patient were reviewed by me and considered in my medical decision making (see chart for details).     Patient is well-appearing, afebrile, nontoxic, nontachycardic.  Abscess is actively draining with no indication for I&D in clinic.  Will start Bactrim DS twice daily for 7 days.  She was encouraged to continue warm compresses/sitz bath to encourage drainage.  She is to follow-up with OB/GYN and was given contact information for local provider with instruction to call to schedule an appointment.  We discussed that if this lesion is no longer draining and becomes larger and more painful she should return as we would consider I&D in clinic.  She can use Tylenol for pain relief.  We discussed that if she has any worsening or changing symptoms including fever, nausea, vomiting, enlarging lesion, increasing pain she needs to be seen immediately.  Strict return precautions given.  Work excuse note provided.  Final Clinical Impressions(s) / UC Diagnoses   Final diagnoses:  Vulvar abscess     Discharge Instructions      You have an abscess.  Please start Bactrim twice daily for 7 days.  Use warm compresses/sitting in warm water 3 times per day to encourage drainage.  If this enlarges and becomes more painful please return for reevaluation.  If you develop any fever, nausea, vomiting you need to be seen immediately.  Follow-up with OB/GYN; call to schedule an appointment.    ED Prescriptions     Medication Sig Dispense Auth. Provider   sulfamethoxazole-trimethoprim (BACTRIM DS) 800-160 MG tablet Take 1 tablet by mouth 2 (two) times daily for 7 days. 14 tablet Britani Beattie, Noberto Retort,  PA-C      PDMP not reviewed this encounter.   Jeani Hawking, PA-C 03/10/23 2109

## 2023-03-10 NOTE — Discharge Instructions (Signed)
You have an abscess.  Please start Bactrim twice daily for 7 days.  Use warm compresses/sitting in warm water 3 times per day to encourage drainage.  If this enlarges and becomes more painful please return for reevaluation.  If you develop any fever, nausea, vomiting you need to be seen immediately.  Follow-up with OB/GYN; call to schedule an appointment.

## 2023-07-21 ENCOUNTER — Ambulatory Visit
Admission: EM | Admit: 2023-07-21 | Discharge: 2023-07-21 | Disposition: A | Discharge status: Home / Self Care | Payer: MEDICAID | Attending: Physician Assistant | Admitting: Physician Assistant

## 2023-07-21 DIAGNOSIS — L0291 Cutaneous abscess, unspecified: Secondary | ICD-10-CM

## 2023-07-21 MED ORDER — DOXYCYCLINE HYCLATE 100 MG PO CAPS: 100.0000 mg | ORAL_CAPSULE | Freq: Two times a day (BID) | ORAL | 0 refills | Status: AC

## 2023-07-21 NOTE — ED Provider Notes (Signed)
[facility]    CSN: [id number] Arrival date & time: 07/21/23  1617      History   Chief Complaint Chief Complaint  Patient presents with   Abscess    HPI Penny Young is a 37 y.o. female.   Here today for evaluation of abscess to her left lower leg that started after a suspected bug bite 2 weeks ago.  She states the area has grown in size and is more painful.  She has had some fever recently.  She denies any numbness or tingling.  The history is provided by the patient.    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   Drug abuse (HCC)    Hepatitis B    HIV (human immunodeficiency virus infection) (HCC)    Hypertension    Obese    Restless leg syndrome    Schizophrenia [facility])     Patient Active Problem List   Diagnosis Date Noted   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 Authorizing Provider  doxycycline (VIBRAMYCIN) 100 MG capsule Take 1 capsule (100 mg total) by mouth 2 (two) times daily. 07/21/23  Yes Tomi Bamberger, PA-C  albuterol (VENTOLIN HFA) 108 (90 Base) MCG/ACT inhaler Inhale 1-2 puffs into the lungs every 6 (six) hours as needed for wheezing or shortness of breath. 09/24/22   Gustavus Bryant, FNP  amLODipine (NORVASC) 5 MG tablet Take 1 tablet (5 mg total) by mouth daily. 05/25/19   Jene Every, MD  BIKTARVY 50-200-25 MG TABS tablet Take 1 tablet by mouth daily. 05/03/20   [provider]  diclofenac (VOLTAREN) 75 MG EC tablet Take 1  tablet (75 mg total) by mouth 2 (two) times daily. Patient not taking: Reported on 03/10/2023 04/30/22   Zenia Resides, MD  potassium chloride (KLOR-CON) 10 MEQ tablet TAKE 1 TABLET(10 MEQ) BY MOUTH TWICE DAILY Patient not taking: Reported on 03/10/2023 01/15/22   [provider]  tiZANidine (ZANAFLEX) 4 MG tablet Take 1 tablet (4 mg total) by mouth every 8 (eight) hours as needed for muscle spasms. Patient not taking: Reported on 03/10/2023 02/18/23   Jannifer Franklin, MD    Family History Family History  Problem Relation Age of Onset   Hypertension Other     Social History Social History   Tobacco Use   Smoking status: Every Day    Current packs/day: 0.00    Types: Cigarettes    Last attempt to quit: 11/26/2007    Years since quitting: 15.6   Smokeless tobacco: Never  Vaping Use   Vaping status: Never Used  Substance Use Topics  Alcohol use: Not Currently    Comment: occ   Drug use: Yes    Types: Marijuana    Comment: past abuse     Allergies   Aspirin   Review of Systems Review of Systems  Constitutional:  Negative for chills and fever.  Eyes:  Negative for discharge and redness.  Gastrointestinal:  Negative for abdominal pain, nausea and vomiting.  Skin:  Positive for color change and wound.     Physical Exam Triage Vital Signs ED Triage Vitals  Encounter Vitals Group     BP      Systolic BP Percentile      Diastolic BP Percentile      Pulse      Resp      Temp      Temp src      SpO2      Weight      Height      Head Circumference      Peak Flow      Pain Score      Pain Loc      Pain Education      Exclude from Growth Chart    No data found.  Updated Vital Signs BP 121/79 (BP Location: Left Arm)   Pulse 93   Temp 98.5 F (36.9 C) (Oral)   Resp 20   LMP 06/25/2023   SpO2 99%    Physical Exam Vitals and nursing note reviewed.  Constitutional:      General: She is not in acute distress.    Appearance: Normal appearance. She is  not ill-appearing.  HENT:     Head: Normocephalic and atraumatic.  Eyes:     Conjunctiva/sclera: Conjunctivae normal.  Cardiovascular:     Rate and Rhythm: Normal rate.  Pulmonary:     Effort: Pulmonary effort is normal. No respiratory distress.  Skin:    Comments: Approximately 3 cm area of induration with white purulent drainage from anterior left lower leg just distal to the knee.  Mild swelling and erythema noted to proximal left lower leg  Neurological:     Mental Status: She is alert.  Psychiatric:        Mood and Affect: Mood normal.        Behavior: Behavior normal.        Thought Content: Thought content normal.      UC Treatments / Results  Labs (all labs ordered are listed, but only abnormal results are displayed) Labs Reviewed - No data to display  EKG   Radiology No results found.  Procedures Procedures (including critical care time)  Medications Ordered in UC Medications - No data to display  Initial Impression / Assessment and Plan / UC Course  I have reviewed the triage vital signs and the nursing notes.  Pertinent labs & imaging results that were available during my care of the patient were reviewed by me and considered in my medical decision making (see chart for details).    Doxycycline prescribed to cover suspected secondary infection.  Recommended follow-up in the emergency department if symptoms do not improve or with any worsening symptoms.  Patient expressed understanding.  Final Clinical Impressions(s) / UC Diagnoses   Final diagnoses:  Abscess   Discharge Instructions   None    ED Prescriptions     Medication Sig Dispense Auth. Provider   doxycycline (VIBRAMYCIN) 100 MG capsule Take 1 capsule (100 mg total) by mouth 2 (two) times daily. 20 capsule Tomi Bamberger, PA-C  PDMP not reviewed this encounter.   Tomi Bamberger, PA-C 07/21/23 1719

## 2023-07-21 NOTE — ED Triage Notes (Signed)
Pt states had  bug bite 2wks ago to LLE. Since Saturday its draining, painful, and skin is hard around it.

## 2023-10-07 ENCOUNTER — Ambulatory Visit
Admission: EM | Admit: 2023-10-07 | Discharge: 2023-10-07 | Disposition: A | Discharge status: Home / Self Care | Payer: MEDICAID | Attending: Internal Medicine | Admitting: Internal Medicine

## 2023-10-07 DIAGNOSIS — H02843 Edema of right eye, unspecified eyelid: Secondary | ICD-10-CM | POA: Diagnosis not present

## 2023-10-07 DIAGNOSIS — H00012 Hordeolum externum right lower eyelid: Secondary | ICD-10-CM | POA: Diagnosis not present

## 2023-10-07 MED ORDER — CEFDINIR 300 MG PO CAPS: 300.0000 mg | ORAL_CAPSULE | Freq: Two times a day (BID) | ORAL | 0 refills | Status: AC

## 2023-10-07 MED ORDER — ERYTHROMYCIN 5 MG/GM OP OINT: TOPICAL_OINTMENT | OPHTHALMIC | 0 refills | Status: AC

## 2023-10-07 NOTE — ED Provider Notes (Signed)
[facility]    CSN: [id number] Arrival date & time: 10/07/23  1346      History   Chief Complaint No chief complaint on file.   HPI Penny Young is a 37 y.o. female.   Patient presents with swelling and redness to right bottom eyelid that started yesterday.  She denies trauma or foreign body to the eye.  Denies that she wears contacts or glasses.  Denies blurry vision.     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 Authorizing Provider  cefdinir (OMNICEF) 300 MG capsule Take 1 capsule (300 mg total) by mouth 2 (two) times daily for 7 days. 10/07/23 10/14/23 Yes Marcella Dunnaway, Acie Fredrickson, FNP  erythromycin ophthalmic ointment Place a 1/2 inch ribbon of ointment into the lower eyelid 4 times daily for 7 days. 10/07/23  Yes Sabrinia Prien, Acie Fredrickson, FNP  albuterol (VENTOLIN HFA) 108 (90 Base) MCG/ACT inhaler Inhale 1-2 puffs into the lungs every 6 (six) hours as needed for wheezing or shortness of breath. 09/24/22   Gustavus Bryant, FNP  amLODipine (NORVASC) 5 MG tablet Take 1 tablet (5 mg total) by mouth daily. 05/25/19   Jene Every, MD  BIKTARVY 50-200-25 MG TABS tablet Take 1 tablet by mouth daily. 05/03/20   [provider]  diclofenac (VOLTAREN) 75 MG EC tablet Take 1 tablet (75 mg total) by mouth 2 (two) times daily. Patient not taking: Reported on 03/10/2023 04/30/22   Zenia Resides, MD  doxycycline (VIBRAMYCIN) 100 MG capsule Take 1 capsule (100 mg total) by mouth 2 (two) times daily. 07/21/23   Tomi Bamberger, PA-C  potassium chloride (KLOR-CON) 10 MEQ tablet TAKE 1 TABLET(10 MEQ) BY MOUTH TWICE DAILY Patient not taking: Reported on 03/10/2023 01/15/22   [provider]  tiZANidine (ZANAFLEX) 4 MG tablet Take 1 tablet (4 mg total) by mouth every 8 (eight) hours as needed for muscle spasms. Patient not taking: Reported on 03/10/2023 02/18/23   Jannifer Franklin, MD    Family History Family History  Problem Relation Age of Onset   Hypertension Other     Social History Social History   Tobacco Use   Smoking status: Every Day    Current packs/day: 0.00    Types: Cigarettes  Last attempt to quit: 11/26/2007    Years since quitting: 15.8   Smokeless tobacco: Never  Vaping Use   Vaping status: Never Used  Substance Use Topics   Alcohol use: Not Currently    Comment: occ   Drug use: Yes    Types: Marijuana    Comment: past abuse     Allergies   Aspirin   Review of Systems Review of Systems Per HPI  Physical Exam Triage Vital Signs ED Triage Vitals  Encounter Vitals Group     BP 10/07/23 1515 127/78     Systolic BP Percentile --      Diastolic BP Percentile --      Pulse Rate 10/07/23 1515 84     Resp 10/07/23 1515 18     Temp 10/07/23 1515 98.3 F (36.8 C)     Temp Source 10/07/23 1515 Oral     SpO2 10/07/23 1515 98 %     Weight 10/07/23 1514 175 lb (79.4 kg)     Height 10/07/23 1514 5\' 7"  (1.702 m)     Head Circumference --      Peak Flow --      Pain Score 10/07/23 1514 10     Pain Loc --      Pain Education --      Exclude from Growth Chart --    No data found.  Updated Vital Signs BP 127/78 (BP Location: Right Arm)   Pulse 84   Temp 98.3 F  (36.8 C) (Oral)   Resp 18   Ht 5\' 7"  (1.702 m)   Wt 175 lb (79.4 kg)   LMP 09/09/2023   SpO2 98%   BMI 27.41 kg/m   Visual Acuity Right Eye Distance:   Left Eye Distance:   Bilateral Distance:    Right Eye Near:   Left Eye Near:    Bilateral Near:     Physical Exam Constitutional:      General: She is not in acute distress.    Appearance: Normal appearance. She is not toxic-appearing or diaphoretic.  HENT:     Head: Normocephalic and atraumatic.  Eyes:     Extraocular Movements: Extraocular movements intact.     Conjunctiva/sclera: Conjunctivae normal.      Comments: Patient has a stye like lesion present to right lower eyelid and left inner corner.  Swelling and mild redness does extend throughout the lower eyelid as well.  No drainage noted.  Pulmonary:     Effort: Pulmonary effort is normal.  Neurological:     General: No focal deficit present.     Mental Status: She is alert and oriented to person, place, and time. Mental status is at baseline.  Psychiatric:        Mood and Affect: Mood normal.        Behavior: Behavior normal.        Thought Content: Thought content normal.        Judgment: Judgment normal.      UC Treatments / Results  Labs (all labs ordered are listed, but only abnormal results are displayed) Labs Reviewed - No data to display  EKG   Radiology No results found.  Procedures Procedures (including critical care time)  Medications Ordered in UC Medications - No data to display  Initial Impression / Assessment and Plan / UC Course  I have reviewed the triage vital signs and the nursing notes.  Pertinent labs & imaging results that were available during my care of  the patient were reviewed by me and considered in my medical decision making (see chart for details).     Patient has stye with surrounding swelling.  Will treat with erythromycin ointment.  Cefdinir also prescribed given swelling noted.  Advised warm compresses and  following up with ophthalmology at provided contact if symptoms persist or worsen.  Visual acuity appears normal.  Patient verbalized understanding and was agreeable with plan. Final Clinical Impressions(s) / UC Diagnoses   Final diagnoses:  Hordeolum externum of right lower eyelid  Swelling of eyelid, right     Discharge Instructions      I have prescribed an antibiotic that you will place in your eye as well as an antibiotic that you will take by mouth.  Follow-up with eye doctor if symptoms persist or worsen.  Use warm compresses as well as we dicussed.     ED Prescriptions     Medication Sig Dispense Auth. Provider   erythromycin ophthalmic ointment Place a 1/2 inch ribbon of ointment into the lower eyelid 4 times daily for 7 days. 3.5 g Ervin Knack E, [place]   cefdinir (OMNICEF) 300 MG capsule Take 1 capsule (300 mg total) by mouth 2 (two) times daily for 7 days. 14 capsule [place], Acie Fredrickson, [place]      PDMP not reviewed this encounter.   Gustavus Bryant, [place] 10/07/23 [phone]

## 2023-10-07 NOTE — Discharge Instructions (Addendum)
I have prescribed an antibiotic that you will place in your eye as well as an antibiotic that you will take by mouth.  Follow-up with eye doctor if symptoms persist or worsen.  Use warm compresses as well as we dicussed.

## 2023-10-07 NOTE — ED Triage Notes (Signed)
Patient presents with redness on right bottom eyelid. No treatment used.

## 2023-11-12 ENCOUNTER — Other Ambulatory Visit: Payer: Self-pay

## 2023-11-12 ENCOUNTER — Encounter: Payer: Self-pay | Admitting: *Deleted

## 2023-11-12 ENCOUNTER — Ambulatory Visit
Admission: EM | Admit: 2023-11-12 | Discharge: 2023-11-12 | Disposition: A | Discharge status: Home / Self Care | Payer: MEDICAID | Attending: Internal Medicine | Admitting: Internal Medicine

## 2023-11-12 DIAGNOSIS — R079 Chest pain, unspecified: Secondary | ICD-10-CM | POA: Diagnosis not present

## 2023-11-12 NOTE — ED Triage Notes (Addendum)
Pt reports chest pain this morning, onset around 8-8:30. States she felt SOB and was sweating. Also reports pain in low back. Denies falls/injury

## 2023-11-12 NOTE — ED Notes (Signed)
Patient is being discharged from the Urgent Care and sent to the Emergency Department via POV . Per A. Acevedo, PA-C, patient is in need of higher level of care due to Chest pain. Patient is aware and verbalizes understanding of plan of care.  Vitals:   11/12/23 1118  BP: (!) 150/99  Pulse: 74  Resp: 18  Temp: 98.6 F (37 C)  SpO2: 98%

## 2023-11-12 NOTE — ED Provider Notes (Addendum)
[facility]    CSN: [id number] Arrival date & time: 11/12/23  1103      History   Chief Complaint Chief Complaint  Patient presents with   Chest Pain    HPI Penny Young is a 37 y.o. female.    Chest Pain Associated symptoms: diaphoresis and shortness of breath   Associated symptoms: no abdominal pain, no back pain, no dizziness, no fatigue, no fever and no palpitations   Sided chest pain abrupt onset this morning, pain described as pressure, constant, worse when she takes a deep breath.  Had some this of breath and sweating.  She is a smoker, denies drug use.  Denies recent travel, lower extremity swelling, abdominal pain, nausea, vomiting injury.  Also complaining of low back pain, 8 out of 10, worse with prolonged standing.   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 Authorizing Provider  albuterol (VENTOLIN HFA) 108 (90 Base) MCG/ACT inhaler Inhale 1-2 puffs into the lungs every 6 (six) hours as needed for wheezing or shortness of breath. Patient not taking: Reported on 11/12/2023 09/24/22   Gustavus Bryant, FNP  amLODipine (NORVASC) 5 MG tablet Take 1 tablet (5 mg total) by mouth daily. Patient not  taking: Reported on 11/12/2023 05/25/19   Jene Every, MD  BIKTARVY 50-200-25 MG TABS tablet Take 1 tablet by mouth daily. Patient not taking: Reported on 11/12/2023 05/03/20   [provider]  diclofenac (VOLTAREN) 75 MG EC tablet Take 1 tablet (75 mg total) by mouth 2 (two) times daily. Patient not taking: Reported on 03/10/2023 04/30/22   Zenia Resides, MD  doxycycline (VIBRAMYCIN) 100 MG capsule Take 1 capsule (100 mg total) by mouth 2 (two) times daily. 07/21/23   Tomi Bamberger, PA-C  erythromycin ophthalmic ointment Place a 1/2 inch ribbon of ointment into the lower eyelid 4 times daily for 7 days. 10/07/23   Gustavus Bryant, FNP  potassium chloride (KLOR-CON) 10 MEQ tablet TAKE 1 TABLET(10 MEQ) BY MOUTH TWICE DAILY Patient not taking: Reported on 03/10/2023 01/15/22   [provider]  tiZANidine (ZANAFLEX) 4 MG tablet Take 1 tablet (4 mg total) by mouth every 8 (eight) hours as needed for muscle spasms. Patient not taking: Reported on 03/10/2023 02/18/23  Jannifer Franklin, MD    Family History Family History  Problem Relation Age of Onset   Hypertension Other     Social History Social History   Tobacco Use   Smoking status: Every Day    Current packs/day: 0.00    Types: Cigarettes    Last attempt to quit: 11/26/2007    Years since quitting: 15.9   Smokeless tobacco: Never  Vaping Use   Vaping status: Never Used  Substance Use Topics   Alcohol use: Not Currently    Comment: occ   Drug use: Not Currently    Types: Marijuana    Comment: past abuse     Allergies   Aspirin   Review of Systems Review of Systems  Constitutional:  Positive for diaphoresis. Negative for fatigue and fever.  Respiratory:  Positive for shortness of breath. Negative for choking, chest tightness and wheezing.   Cardiovascular:  Positive for chest pain. Negative for palpitations and leg swelling.  Gastrointestinal:  Negative for abdominal pain.  Musculoskeletal:  Negative for  back pain.  Neurological:  Negative for dizziness.     Physical Exam Triage Vital Signs ED Triage Vitals  Encounter Vitals Group     BP 11/12/23 1118 (!) 150/99     Systolic BP Percentile --      Diastolic BP Percentile --      Pulse Rate 11/12/23 1118 74     Resp 11/12/23 1118 18     Temp 11/12/23 1118 98.6 F (37 C)     Temp Source 11/12/23 1118 Oral     SpO2 11/12/23 1118 98 %     Weight --      Height --      Head Circumference --      Peak Flow --      Pain Score 11/12/23 1108 6     Pain Loc --      Pain Education --      Exclude from Growth Chart --    No data found.  Updated Vital Signs BP (!) 150/99 (BP Location: Right Arm)   Pulse 74   Temp 98.6 F (37 C) (Oral)   Resp 18   LMP 10/23/2023 (Approximate)   SpO2 98%   Visual Acuity Right Eye Distance:   Left Eye Distance:   Bilateral Distance:    Right Eye Near:   Left Eye Near:    Bilateral Near:     Physical Exam Vitals and nursing note reviewed.  Constitutional:      Appearance: She is not ill-appearing.  HENT:     Head: Normocephalic.  Cardiovascular:     Rate and Rhythm: Normal rate and regular rhythm.     Heart sounds: Normal heart sounds.  Pulmonary:     Effort: Pulmonary effort is normal. No tachypnea.     Breath sounds: No decreased breath sounds, wheezing, rhonchi or rales.  Chest:     Chest wall: No tenderness.  Abdominal:     Palpations: Abdomen is soft.  Musculoskeletal:     Cervical back: Neck supple.  Neurological:     Mental Status: She is alert.      UC Treatments / Results  Labs (all labs ordered are listed, but only abnormal results are displayed) Labs Reviewed - No data to display  EKG   Radiology No results found.  Procedures Procedures (including critical care time)  Medications Ordered in UC Medications - No data to display  Initial Impression / Assessment and Plan / UC  Course  I have reviewed the triage vital signs and the nursing  notes.  Pertinent labs & imaging results that were available during my care of the patient were reviewed by me and considered in my medical decision making (see chart for details).     37 year old smoker reports left-sided chest pain associated with shortness of breath and sweating, pain worse with deep breaths.  EKG independently viewed by me normal sinus rhythm with a rate of 76 normal axis normal intervals no acute ischemic changes.  Patient counseled to go to the emergency department for further evaluation of her chest pain Final Clinical Impressions(s) / UC Diagnoses   Final diagnoses:  Chest pain, unspecified type     Discharge Instructions      Directly to the emergency department for further evaluation of your chest pain   ED Prescriptions   None    PDMP not reviewed this encounter.   Meliton Rattan, PA 11/12/23 1144    Meliton Rattan, [place] 11/12/23 1145

## 2023-11-12 NOTE — Discharge Instructions (Signed)
Directly to the emergency department for further evaluation of your chest pain

## 2024-02-22 ENCOUNTER — Encounter: Payer: Self-pay | Admitting: *Deleted

## 2024-02-22 ENCOUNTER — Ambulatory Visit
Admission: EM | Admit: 2024-02-22 | Discharge: 2024-02-22 | Disposition: A | Discharge status: Home / Self Care | Payer: MEDICAID | Attending: Physician Assistant | Admitting: Physician Assistant

## 2024-02-22 DIAGNOSIS — M545 Low back pain, unspecified: Secondary | ICD-10-CM

## 2024-02-22 MED ORDER — PREDNISONE 20 MG PO TABS: 40.0000 mg | ORAL_TABLET | Freq: Every day | ORAL | 0 refills | Status: AC

## 2024-02-22 MED ORDER — CYCLOBENZAPRINE HCL 10 MG PO TABS: 10.0000 mg | ORAL_TABLET | Freq: Two times a day (BID) | ORAL | 0 refills | Status: AC | PRN

## 2024-02-22 NOTE — ED Provider Notes (Signed)
 [facility]    CSN: [id number] Arrival date & time: 02/22/24  1124      History   Chief Complaint Chief Complaint  Patient presents with   Back Pain    HPI Penny Young is a 38 y.o. female.   Patient here today for evaluation of lower back pain that started after she was lifting a heavy bed frame out of a truck.  She reports that she has not had any other injuries.  She denies any numbness or tingling and has not had any loss of bowel or bladder function.  She notes pain does not radiate.  Certain movements worsen pain.  She has not taken any medication or had any treatment for pain at home.  The history is provided by the patient.  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 Authorizing Provider  cyclobenzaprine (FLEXERIL) 10 MG tablet Take 1 tablet (10 mg total) by mouth 2 (two) times daily as needed for muscle spasms. 02/22/24  Yes Tomi Bamberger, PA-C  predniSONE (DELTASONE) 20 MG tablet Take 2 tablets (40 mg total) by mouth daily with breakfast for 5 days. 02/22/24 02/27/24 Yes Tomi Bamberger, PA-C  albuterol (VENTOLIN  HFA) 108 (90 Base) MCG/ACT inhaler Inhale 1-2 puffs into the lungs every 6 (six) hours as needed for wheezing or shortness of breath. Patient not taking: Reported on 11/12/2023 09/24/22   Gustavus Bryant, FNP  amLODipine (NORVASC) 5 MG tablet Take 1 tablet (5 mg total) by mouth daily. Patient not taking: Reported on 11/12/2023 05/25/19   Jene Every, MD  BIKTARVY 50-200-25 MG TABS tablet Take 1 tablet by mouth daily. Patient not taking: Reported on 11/12/2023 05/03/20   [provider]  diclofenac (VOLTAREN) 75 MG EC tablet Take 1 tablet (75 mg total) by mouth 2 (two) times daily. Patient not taking: Reported on 03/10/2023 04/30/22   Zenia Resides, MD  doxycycline (VIBRAMYCIN) 100 MG capsule Take 1 capsule (100 mg total) by mouth 2 (two) times daily. 07/21/23   Tomi Bamberger, PA-C  erythromycin ophthalmic ointment Place a 1/2 inch ribbon of ointment into the lower eyelid 4 times daily for 7 days.  10/07/23   Gustavus Bryant, FNP  potassium chloride (KLOR-CON) 10 MEQ tablet TAKE 1 TABLET(10 MEQ) BY MOUTH TWICE DAILY Patient not taking: Reported on 03/10/2023 01/15/22   [provider]    Family History Family History  Problem Relation Age of Onset   Hypertension Other     Social History Social History   Tobacco Use   Smoking status: Every Day    Current packs/day: 0.00    Types: Cigarettes    Last attempt to quit: 11/26/2007    Years since quitting: 16.2   Smokeless tobacco: Never  Vaping Use   Vaping status: Never Used  Substance Use Topics   Alcohol use: Not Currently   Drug use: Not Currently    Types: Marijuana    Comment: past abuse     Allergies   Aspirin   Review of Systems Review of Systems  Constitutional:  Negative for chills and fever.  Eyes:  Negative for discharge and redness.  Respiratory:  Negative for shortness of breath.   Gastrointestinal:  Negative for abdominal pain, nausea and vomiting.  Musculoskeletal:  Positive for back pain and  myalgias.  Neurological:  Negative for numbness.     Physical Exam Triage Vital Signs ED Triage Vitals [02/22/24 1133]  Encounter Vitals Group     BP (!) 136/90     Systolic BP Percentile      Diastolic BP Percentile      Pulse Rate 94     Resp 16     Temp 98 F (36.7 C)     Temp Source Oral     SpO2 96 %     Weight      Height      Head Circumference      Peak Flow      Pain Score 9     Pain Loc      Pain Education      Exclude from Growth Chart    No data found.  Updated Vital Signs BP (!) 136/90   Pulse 94   Temp 98 F (36.7 C) (Oral)   Resp 16   LMP 02/10/2024 (Exact Date)   SpO2 96%   Visual Acuity Right Eye Distance:   Left Eye Distance:   Bilateral Distance:    Right Eye Near:   Left Eye Near:    Bilateral Near:     Physical Exam Vitals and nursing note reviewed.  Constitutional:      General: She is not in acute distress.    Appearance: Normal appearance. She is not ill-appearing.  HENT:     Head: Normocephalic and atraumatic.  Eyes:     Conjunctiva/sclera: Conjunctivae normal.  Cardiovascular:     Rate and Rhythm: Normal rate.  Pulmonary:     Effort: Pulmonary effort is normal. No respiratory distress.  Musculoskeletal:     Comments: No tenderness to palpation to midline thoracic or lumbar spine.  Mild tenderness across low back.  Neurological:     Mental Status: She is alert.  Psychiatric:        Mood and Affect: Mood normal.        Behavior: Behavior normal.        Thought Content: Thought content normal.      UC Treatments / Results  Labs (all labs ordered are listed, but only abnormal results are displayed) Labs Reviewed - No data to display  EKG   Radiology No results found.  Procedures Procedures (including critical care time)  Medications Ordered in UC Medications - No data to display  Initial Impression / Assessment and Plan / UC Course  I have reviewed the triage vital signs and the nursing notes.  Pertinent  labs & imaging results that were available during my care of the patient were reviewed by me and considered in my medical decision making (see chart for details).    Will treat to cover muscle strain with cyclobenzaprine and prednisone.  Advised that muscle relaxer may cause drowsiness and advised to use with caution.  Discussed heat application to hopefully help with symptoms as well.  Recommended further evaluation in the emergency room for imaging should symptoms not improve with treatment or worsen.  Patient expresses understanding.  Final Clinical Impressions(s) / UC Diagnoses   Final diagnoses:  Acute bilateral low back pain without sciatica   Discharge Instructions   None    ED Prescriptions     Medication Sig Dispense Auth. Provider   cyclobenzaprine (FLEXERIL) 10 MG tablet Take 1 tablet (10 mg total) by mouth 2 (two) times daily as needed for muscle spasms. 20 tablet Erma Pinto F, PA-C   predniSONE (DELTASONE) 20 MG tablet Take 2 tablets (40 mg total) by mouth daily with breakfast for 5 days. 10 tablet Tomi Bamberger, PA-C      PDMP not reviewed this encounter.   Tomi Bamberger, PA-C 02/22/24 [phone]

## 2024-02-22 NOTE — ED Triage Notes (Signed)
 Pt states she was lifting a bed frame out of a truck last night when she felt sudden onset pain across low back. Denies any parasthesias or radiating of pain. Has not taken any measures to help alleviate the pain at home.
